# Patient Record
Sex: Female | Born: 1955 | Race: White | Hispanic: No | Marital: Married | Smoking: Former smoker
Health system: Southern US, Community
[De-identification: ages and names within clinical notes are randomized; demographics above are authoritative.]

## PROBLEM LIST (undated history)

## (undated) ENCOUNTER — Emergency Department (HOSPITAL_COMMUNITY): Discharge status: Absent without leave | Payer: Commercial Managed Care - PPO

## (undated) DIAGNOSIS — H9190 Unspecified hearing loss, unspecified ear: Secondary | ICD-10-CM

## (undated) DIAGNOSIS — M810 Age-related osteoporosis without current pathological fracture: Secondary | ICD-10-CM

## (undated) DIAGNOSIS — C801 Malignant (primary) neoplasm, unspecified: Secondary | ICD-10-CM

## (undated) DIAGNOSIS — F329 Major depressive disorder, single episode, unspecified: Secondary | ICD-10-CM

## (undated) DIAGNOSIS — M1990 Unspecified osteoarthritis, unspecified site: Secondary | ICD-10-CM

## (undated) DIAGNOSIS — G4730 Sleep apnea, unspecified: Secondary | ICD-10-CM

## (undated) DIAGNOSIS — M797 Fibromyalgia: Secondary | ICD-10-CM

## (undated) DIAGNOSIS — I219 Acute myocardial infarction, unspecified: Secondary | ICD-10-CM

## (undated) DIAGNOSIS — R112 Nausea with vomiting, unspecified: Secondary | ICD-10-CM

## (undated) DIAGNOSIS — K862 Cyst of pancreas: Secondary | ICD-10-CM

## (undated) DIAGNOSIS — B1920 Unspecified viral hepatitis C without hepatic coma: Secondary | ICD-10-CM

## (undated) DIAGNOSIS — K219 Gastro-esophageal reflux disease without esophagitis: Secondary | ICD-10-CM

## (undated) DIAGNOSIS — I499 Cardiac arrhythmia, unspecified: Secondary | ICD-10-CM

## (undated) DIAGNOSIS — G8929 Other chronic pain: Secondary | ICD-10-CM

## (undated) DIAGNOSIS — I2510 Atherosclerotic heart disease of native coronary artery without angina pectoris: Secondary | ICD-10-CM

## (undated) DIAGNOSIS — F32A Depression, unspecified: Secondary | ICD-10-CM

## (undated) DIAGNOSIS — Z8719 Personal history of other diseases of the digestive system: Secondary | ICD-10-CM

## (undated) DIAGNOSIS — M545 Low back pain, unspecified: Secondary | ICD-10-CM

## (undated) DIAGNOSIS — Z9889 Other specified postprocedural states: Secondary | ICD-10-CM

## (undated) DIAGNOSIS — E785 Hyperlipidemia, unspecified: Secondary | ICD-10-CM

## (undated) DIAGNOSIS — R011 Cardiac murmur, unspecified: Secondary | ICD-10-CM

## (undated) HISTORY — DX: Major depressive disorder, single episode, unspecified: F32.9

## (undated) HISTORY — DX: Age-related osteoporosis without current pathological fracture: M81.0

## (undated) HISTORY — DX: Cyst of pancreas: K86.2

## (undated) HISTORY — DX: Unspecified viral hepatitis C without hepatic coma: B19.20

## (undated) HISTORY — DX: Depression, unspecified: F32.A

## (undated) HISTORY — PX: GANGLION CYST EXCISION: SHX1691

## (undated) HISTORY — DX: Atherosclerotic heart disease of native coronary artery without angina pectoris: I25.10

## (undated) HISTORY — DX: Malignant (primary) neoplasm, unspecified: C80.1

## (undated) HISTORY — PX: COLONOSCOPY: SHX174

## (undated) HISTORY — DX: Cardiac murmur, unspecified: R01.1

## (undated) HISTORY — DX: Hyperlipidemia, unspecified: E78.5

## (undated) HISTORY — PX: CATARACT EXTRACTION W/ INTRAOCULAR LENS  IMPLANT, BILATERAL: SHX1307

---

## 1997-07-04 ENCOUNTER — Ambulatory Visit (HOSPITAL_COMMUNITY): Admission: RE | Admit: 1997-07-04 | Discharge: 1997-07-04 | Payer: Self-pay | Admitting: Gastroenterology

## 1997-07-07 ENCOUNTER — Ambulatory Visit (HOSPITAL_COMMUNITY): Admission: RE | Admit: 1997-07-07 | Discharge: 1997-07-07 | Payer: Self-pay | Admitting: Obstetrics and Gynecology

## 1997-07-21 ENCOUNTER — Ambulatory Visit (HOSPITAL_COMMUNITY): Admission: RE | Admit: 1997-07-21 | Discharge: 1997-07-21 | Payer: Self-pay | Admitting: Family Medicine

## 1997-12-09 ENCOUNTER — Ambulatory Visit (HOSPITAL_COMMUNITY): Admission: RE | Admit: 1997-12-09 | Discharge: 1997-12-09 | Payer: Self-pay | Admitting: Gastroenterology

## 1998-05-04 ENCOUNTER — Ambulatory Visit (HOSPITAL_COMMUNITY): Admission: RE | Admit: 1998-05-04 | Discharge: 1998-05-04 | Payer: Self-pay | Admitting: Family Medicine

## 1998-05-04 ENCOUNTER — Encounter: Payer: Self-pay | Admitting: Family Medicine

## 1999-06-07 ENCOUNTER — Encounter: Payer: Self-pay | Admitting: Family Medicine

## 1999-06-07 ENCOUNTER — Ambulatory Visit (HOSPITAL_COMMUNITY): Admission: RE | Admit: 1999-06-07 | Discharge: 1999-06-07 | Payer: Self-pay | Admitting: Family Medicine

## 2000-02-27 ENCOUNTER — Ambulatory Visit (HOSPITAL_COMMUNITY): Admission: RE | Admit: 2000-02-27 | Discharge: 2000-02-27 | Payer: Self-pay

## 2000-12-25 ENCOUNTER — Other Ambulatory Visit: Admission: RE | Admit: 2000-12-25 | Discharge: 2000-12-25 | Payer: Self-pay | Admitting: Family Medicine

## 2001-01-12 ENCOUNTER — Encounter: Payer: Self-pay | Admitting: Family Medicine

## 2001-01-12 ENCOUNTER — Ambulatory Visit (HOSPITAL_COMMUNITY): Admission: RE | Admit: 2001-01-12 | Discharge: 2001-01-12 | Payer: Self-pay | Admitting: Family Medicine

## 2002-03-19 ENCOUNTER — Encounter: Payer: Self-pay | Admitting: Family Medicine

## 2002-03-19 ENCOUNTER — Ambulatory Visit (HOSPITAL_COMMUNITY): Admission: RE | Admit: 2002-03-19 | Discharge: 2002-03-19 | Payer: Self-pay | Admitting: *Deleted

## 2002-05-27 ENCOUNTER — Encounter: Payer: Self-pay | Admitting: Family Medicine

## 2002-05-27 ENCOUNTER — Encounter: Admission: RE | Admit: 2002-05-27 | Discharge: 2002-05-27 | Payer: Self-pay | Admitting: Family Medicine

## 2003-04-26 ENCOUNTER — Ambulatory Visit (HOSPITAL_COMMUNITY): Admission: RE | Admit: 2003-04-26 | Discharge: 2003-04-26 | Payer: Self-pay | Admitting: Family Medicine

## 2004-04-26 ENCOUNTER — Ambulatory Visit (HOSPITAL_COMMUNITY): Admission: RE | Admit: 2004-04-26 | Discharge: 2004-04-26 | Payer: Self-pay | Admitting: Family Medicine

## 2005-07-03 ENCOUNTER — Encounter: Admission: RE | Admit: 2005-07-03 | Discharge: 2005-07-03 | Payer: Self-pay | Admitting: Emergency Medicine

## 2006-06-02 ENCOUNTER — Ambulatory Visit (HOSPITAL_COMMUNITY): Admission: RE | Admit: 2006-06-02 | Discharge: 2006-06-02 | Payer: Self-pay | Admitting: Obstetrics & Gynecology

## 2007-06-04 ENCOUNTER — Ambulatory Visit (HOSPITAL_COMMUNITY): Admission: RE | Admit: 2007-06-04 | Discharge: 2007-06-04 | Payer: Self-pay | Admitting: Obstetrics & Gynecology

## 2007-08-18 ENCOUNTER — Encounter: Admission: RE | Admit: 2007-08-18 | Discharge: 2007-08-18 | Payer: Self-pay | Admitting: Otolaryngology

## 2008-06-06 ENCOUNTER — Encounter: Admission: RE | Admit: 2008-06-06 | Discharge: 2008-06-06 | Payer: Self-pay | Admitting: Obstetrics & Gynecology

## 2008-06-08 ENCOUNTER — Encounter: Admission: RE | Admit: 2008-06-08 | Discharge: 2008-06-08 | Payer: Self-pay | Admitting: Obstetrics & Gynecology

## 2008-08-10 ENCOUNTER — Emergency Department (HOSPITAL_COMMUNITY): Admission: EM | Admit: 2008-08-10 | Discharge: 2008-08-10 | Payer: Self-pay | Admitting: Emergency Medicine

## 2009-05-04 ENCOUNTER — Encounter: Admission: RE | Admit: 2009-05-04 | Discharge: 2009-05-04 | Payer: Self-pay | Admitting: Gastroenterology

## 2009-06-08 HISTORY — PX: TRANSTHORACIC ECHOCARDIOGRAM: SHX275

## 2009-06-08 HISTORY — PX: CARDIOVASCULAR STRESS TEST: SHX262

## 2009-06-09 ENCOUNTER — Encounter: Admission: RE | Admit: 2009-06-09 | Discharge: 2009-06-09 | Payer: Self-pay | Admitting: Obstetrics & Gynecology

## 2009-07-20 ENCOUNTER — Encounter: Admission: RE | Admit: 2009-07-20 | Discharge: 2009-07-20 | Payer: Self-pay | Admitting: Gastroenterology

## 2010-02-04 ENCOUNTER — Encounter: Payer: Self-pay | Admitting: Family Medicine

## 2010-02-04 ENCOUNTER — Encounter: Payer: Self-pay | Admitting: Emergency Medicine

## 2010-02-05 ENCOUNTER — Encounter: Payer: Self-pay | Admitting: Obstetrics & Gynecology

## 2010-04-19 ENCOUNTER — Other Ambulatory Visit: Payer: Self-pay | Admitting: Obstetrics & Gynecology

## 2010-04-19 DIAGNOSIS — Z1231 Encounter for screening mammogram for malignant neoplasm of breast: Secondary | ICD-10-CM

## 2010-06-14 ENCOUNTER — Ambulatory Visit
Admission: RE | Admit: 2010-06-14 | Discharge: 2010-06-14 | Disposition: A | Discharge status: Home / Self Care | Payer: PRIVATE HEALTH INSURANCE | Source: Ambulatory Visit | Attending: Obstetrics & Gynecology | Admitting: Obstetrics & Gynecology

## 2010-06-14 ENCOUNTER — Other Ambulatory Visit: Payer: Self-pay | Admitting: Obstetrics & Gynecology

## 2010-06-14 DIAGNOSIS — Z1231 Encounter for screening mammogram for malignant neoplasm of breast: Secondary | ICD-10-CM

## 2010-06-14 DIAGNOSIS — N6459 Other signs and symptoms in breast: Secondary | ICD-10-CM

## 2010-06-22 ENCOUNTER — Ambulatory Visit
Admission: RE | Admit: 2010-06-22 | Discharge: 2010-06-22 | Disposition: A | Discharge status: Home / Self Care | Payer: PRIVATE HEALTH INSURANCE | Source: Ambulatory Visit | Attending: Obstetrics & Gynecology | Admitting: Obstetrics & Gynecology

## 2010-06-22 ENCOUNTER — Other Ambulatory Visit: Payer: Self-pay | Admitting: Obstetrics & Gynecology

## 2010-06-22 DIAGNOSIS — N6459 Other signs and symptoms in breast: Secondary | ICD-10-CM

## 2010-07-09 ENCOUNTER — Other Ambulatory Visit: Payer: Self-pay | Admitting: Gastroenterology

## 2010-07-09 DIAGNOSIS — B182 Chronic viral hepatitis C: Secondary | ICD-10-CM

## 2010-07-09 DIAGNOSIS — K862 Cyst of pancreas: Secondary | ICD-10-CM

## 2010-07-12 ENCOUNTER — Ambulatory Visit
Admission: RE | Admit: 2010-07-12 | Discharge: 2010-07-12 | Disposition: A | Discharge status: Home / Self Care | Payer: PRIVATE HEALTH INSURANCE | Source: Ambulatory Visit | Attending: Gastroenterology | Admitting: Gastroenterology

## 2010-07-12 DIAGNOSIS — B182 Chronic viral hepatitis C: Secondary | ICD-10-CM

## 2010-07-12 DIAGNOSIS — K862 Cyst of pancreas: Secondary | ICD-10-CM

## 2010-07-12 MED ORDER — GADOBENATE DIMEGLUMINE 529 MG/ML IV SOLN
10.0000 mL | Freq: Once | INTRAVENOUS | Status: AC | PRN
  Administered 2010-07-12: 10 mL via INTRAVENOUS

## 2011-05-16 ENCOUNTER — Other Ambulatory Visit: Payer: Self-pay | Admitting: Obstetrics & Gynecology

## 2011-05-16 DIAGNOSIS — Z1231 Encounter for screening mammogram for malignant neoplasm of breast: Secondary | ICD-10-CM

## 2011-05-24 ENCOUNTER — Other Ambulatory Visit: Payer: Self-pay | Admitting: Gastroenterology

## 2011-05-24 DIAGNOSIS — B1920 Unspecified viral hepatitis C without hepatic coma: Secondary | ICD-10-CM

## 2011-05-24 DIAGNOSIS — K862 Cyst of pancreas: Secondary | ICD-10-CM

## 2011-05-24 DIAGNOSIS — C220 Liver cell carcinoma: Secondary | ICD-10-CM

## 2011-05-30 ENCOUNTER — Ambulatory Visit
Admission: RE | Admit: 2011-05-30 | Discharge: 2011-05-30 | Disposition: A | Discharge status: Home / Self Care | Payer: PRIVATE HEALTH INSURANCE | Source: Ambulatory Visit | Attending: Gastroenterology | Admitting: Gastroenterology

## 2011-05-30 DIAGNOSIS — C220 Liver cell carcinoma: Secondary | ICD-10-CM

## 2011-05-30 DIAGNOSIS — K862 Cyst of pancreas: Secondary | ICD-10-CM

## 2011-05-30 DIAGNOSIS — B1920 Unspecified viral hepatitis C without hepatic coma: Secondary | ICD-10-CM

## 2011-06-14 ENCOUNTER — Ambulatory Visit
Admission: RE | Admit: 2011-06-14 | Discharge: 2011-06-14 | Disposition: A | Discharge status: Home / Self Care | Payer: PRIVATE HEALTH INSURANCE | Source: Ambulatory Visit | Attending: Obstetrics & Gynecology | Admitting: Obstetrics & Gynecology

## 2011-06-14 DIAGNOSIS — Z1231 Encounter for screening mammogram for malignant neoplasm of breast: Secondary | ICD-10-CM

## 2011-06-20 ENCOUNTER — Other Ambulatory Visit: Payer: Self-pay | Admitting: Obstetrics & Gynecology

## 2011-06-20 DIAGNOSIS — R928 Other abnormal and inconclusive findings on diagnostic imaging of breast: Secondary | ICD-10-CM

## 2011-06-28 ENCOUNTER — Ambulatory Visit
Admission: RE | Admit: 2011-06-28 | Discharge: 2011-06-28 | Disposition: A | Discharge status: Home / Self Care | Payer: PRIVATE HEALTH INSURANCE | Source: Ambulatory Visit | Attending: Obstetrics & Gynecology | Admitting: Obstetrics & Gynecology

## 2011-06-28 DIAGNOSIS — R928 Other abnormal and inconclusive findings on diagnostic imaging of breast: Secondary | ICD-10-CM

## 2011-08-08 ENCOUNTER — Encounter: Payer: Self-pay | Admitting: Family Medicine

## 2011-08-08 ENCOUNTER — Ambulatory Visit (INDEPENDENT_AMBULATORY_CARE_PROVIDER_SITE_OTHER): Payer: PRIVATE HEALTH INSURANCE | Admitting: Family Medicine

## 2011-08-08 VITALS — BP 108/62 | HR 80 | Temp 98.3°F | Ht 61.0 in | Wt 125.0 lb

## 2011-08-08 DIAGNOSIS — F3289 Other specified depressive episodes: Secondary | ICD-10-CM

## 2011-08-08 DIAGNOSIS — R300 Dysuria: Secondary | ICD-10-CM | POA: Insufficient documentation

## 2011-08-08 DIAGNOSIS — F329 Major depressive disorder, single episode, unspecified: Secondary | ICD-10-CM | POA: Insufficient documentation

## 2011-08-08 DIAGNOSIS — F32A Depression, unspecified: Secondary | ICD-10-CM | POA: Insufficient documentation

## 2011-08-08 DIAGNOSIS — K862 Cyst of pancreas: Secondary | ICD-10-CM

## 2011-08-08 DIAGNOSIS — K863 Pseudocyst of pancreas: Secondary | ICD-10-CM

## 2011-08-08 DIAGNOSIS — B1920 Unspecified viral hepatitis C without hepatic coma: Secondary | ICD-10-CM | POA: Insufficient documentation

## 2011-08-08 LAB — POCT URINALYSIS DIPSTICK
Bilirubin, UA: NEGATIVE
Blood, UA: NEGATIVE
Glucose, UA: NEGATIVE
Ketones, UA: NEGATIVE
Leukocytes, UA: NEGATIVE
Nitrite, UA: NEGATIVE
Protein, UA: NEGATIVE
Spec Grav, UA: 1.015
Urobilinogen, UA: NEGATIVE
pH, UA: 6

## 2011-08-08 NOTE — Progress Notes (Signed)
Subjective:    Patient ID: Nicole Calhoun, female    DOB: 09-Mar-1955, 56 y.o.   MRN: [id number]  HPI  56 yo female here to establish care.  Hep C- diagnosed over 20 years ago.  Followed by Dr. Kinnie Scales.  Per pt, could not tolerate interferon treatment.  Brings in labs from last month-liver function within normal limits.  Depression- has a h/o anxiety and depression. Also runs in her family. Going through increased stressors at home, has felt more anxious.  Having difficulty staying asleep. Sometimes tearful.  No SI or HI. Tried Wellbutrin- made her too jittery.  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  Awaiting records.  Dysuria- intermittent dysuria over past few weeks. No back pain, no increased frequency, no fever, no nausea or vomiting. Patient Active Problem List  Diagnosis  . Depression  . Hepatitis C  . Pancreatic cyst  . Dysuria   Past Medical History  Diagnosis Date  . Hepatitis C   . Depression   . Pancreatic cyst    No past surgical history on file. History  Substance Use Topics  . Smoking status: Former Games developer  . Smokeless tobacco: Not on file  . Alcohol Use: Not on file   Family History  Problem Relation Age of Onset  . Arthritis Mother   . Heart disease Mother   . Cancer Father    Allergies  Allergen Reactions  . Codeine Nausea Only   Current Outpatient Prescriptions on File Prior to Visit  Medication Sig Dispense Refill  . cetirizine (ZYRTEC) 10 MG tablet Take one by mouth daily as needed       The PMH, PSH, Social History, Family History, Medications, and allergies have been reviewed in [facility], and have been updated if relevant.   Review of Systems See HPI   No CP No SOB Objective:   Physical Exam BP 108/62  Pulse 80  Temp 98.3 F (36.8 C)  Ht 5\' 1"  (1.549 m)  Wt 125 lb (56.7 kg)  BMI 23.62 kg/m2  General:  Well-developed,well-nourished,in no acute distress; alert,appropriate and  cooperative throughout examination Head:  normocephalic and atraumatic.   Eyes:  vision grossly intact, pupils equal, pupils round, and pupils reactive to light.   Ears:  R ear normal and L ear normal.   Nose:  no external deformity.   Mouth:  good dentition.   Neck:  No deformities, masses, or tenderness noted. Lungs:  Normal respiratory effort, chest expands symmetrically. Lungs are clear to auscultation, no crackles or wheezes. Heart:  Normal rate and regular rhythm. S1 and S2 normal without gallop, murmur, click, rub or other extra sounds. Msk:  No deformity or scoliosis noted of thoracic or lumbar spine.   NO CVA tenderness Extremities:  No clubbing, cyanosis, edema, or deformity noted with normal full range of motion of all joints.   Neurologic:  alert & oriented X3 and gait normal.   Skin:  Intact without suspicious lesions or rashes Cervical Nodes:  No lymphadenopathy noted Psych:  Cognition and judgment appear intact. Alert and cooperative with normal attention span and concentration. No apparent delusions, illusions, hallucinations        Assessment & Plan:   1. Depression  Discussed tx options. She is fearful of medications due to her Hep C. She would prefer to try other options- referred to Dr. Laymond Purser for psychotherpay. Ambulatory referral to Psychology  2. Dysuria  UA neg. If symptoms persist, she will  let us know. POCT urinalysis dipstick  3. Hepatitis C  Liver function stable-awaiting records.   4. Pancreatic cyst  Awaiting records.

## 2011-08-08 NOTE — Patient Instructions (Addendum)
It was so nice to meet you. Your urine was clear- please drink plenty of fluids and let me know if your symptoms do not improve over the next week or so. Please stop by to see Shirlee Limerick to set up your referral for a therapist.

## 2011-08-15 ENCOUNTER — Ambulatory Visit: Payer: PRIVATE HEALTH INSURANCE | Admitting: Psychology

## 2011-08-30 ENCOUNTER — Ambulatory Visit: Payer: PRIVATE HEALTH INSURANCE | Admitting: Psychology

## 2011-09-05 ENCOUNTER — Ambulatory Visit (INDEPENDENT_AMBULATORY_CARE_PROVIDER_SITE_OTHER): Payer: PRIVATE HEALTH INSURANCE | Admitting: Psychology

## 2011-09-05 DIAGNOSIS — F411 Generalized anxiety disorder: Secondary | ICD-10-CM

## 2011-09-05 DIAGNOSIS — F331 Major depressive disorder, recurrent, moderate: Secondary | ICD-10-CM

## 2011-09-06 ENCOUNTER — Telehealth: Payer: Self-pay | Admitting: *Deleted

## 2011-09-06 NOTE — Telephone Encounter (Signed)
Pt's psychologist called to let you know that pt will be calling to request an anti depressant.  Dr. Dellia Cloud feels that pt would benefit from something that works for both anxiety and depression.    Do you want pt to schedule appt to discuss?

## 2011-09-06 NOTE — Telephone Encounter (Signed)
Appointment scheduled, patient advised. °

## 2011-09-06 NOTE — Telephone Encounter (Signed)
Yes please make appt to discuss.  At previous office visit she declined antidepressants so we need to talk about options.  Thanks.

## 2011-09-12 ENCOUNTER — Ambulatory Visit (INDEPENDENT_AMBULATORY_CARE_PROVIDER_SITE_OTHER): Payer: PRIVATE HEALTH INSURANCE | Admitting: Family Medicine

## 2011-09-12 VITALS — BP 110/70 | HR 88 | Temp 97.4°F | Wt 127.0 lb

## 2011-09-12 DIAGNOSIS — F329 Major depressive disorder, single episode, unspecified: Secondary | ICD-10-CM

## 2011-09-12 DIAGNOSIS — F32A Depression, unspecified: Secondary | ICD-10-CM

## 2011-09-12 DIAGNOSIS — F3289 Other specified depressive episodes: Secondary | ICD-10-CM

## 2011-09-12 MED ORDER — CITALOPRAM HYDROBROMIDE 20 MG PO TABS: 20.0000 mg | ORAL_TABLET | Freq: Every day | ORAL | Status: DC

## 2011-09-12 NOTE — Progress Notes (Signed)
  Subjective:    Patient ID: Nicole Calhoun, female    DOB: January 13, 1956, 56 y.o.   MRN: [id number]  HPI  56 yo female here to discuss depression.  When she establish cared with me last month, referred her for psychotherapy as she did not want to start an antidepressant at that time. She is now seeing Dr. Dellia Cloud who called our office stating that she would benefit from an antidepressant.    Has a h/o anxiety and depression. Also runs in her family. Going through increased stressors at home, has felt more anxious.  Having obsessive thoughts- worrying all the time. Sometimes tearful.  No SI or HI. Tried Wellbutrin- made her too jittery.     Patient Active Problem List  Diagnosis  . Depression  . Hepatitis C  . Pancreatic cyst  . Dysuria   Past Medical History  Diagnosis Date  . Hepatitis C   . Depression   . Pancreatic cyst    No past surgical history on file. History  Substance Use Topics  . Smoking status: Former Games developer  . Smokeless tobacco: Not on file  . Alcohol Use: Not on file   Family History  Problem Relation Age of Onset  . Arthritis Mother   . Heart disease Mother   . Cancer Father    Allergies  Allergen Reactions  . Codeine Nausea Only   Current Outpatient Prescriptions on File Prior to Visit  Medication Sig Dispense Refill  . cetirizine (ZYRTEC) 10 MG tablet Take one by mouth daily as needed      . ibuprofen (ADVIL,MOTRIN) 600 MG tablet Take 600 mg by mouth every 6 (six) hours as needed.       The PMH, PSH, Social History, Family History, Medications, and allergies have been reviewed in [facility], and have been updated if relevant.   Review of Systems See HPI   No CP No SOB Objective:   Physical Exam BP 110/70  Pulse 88  Temp 97.4 F (36.3 C)  Wt 127 lb (57.607 kg)  General:  Well-developed,well-nourished,in no acute distress; alert,appropriate and cooperative throughout examination Head:  normocephalic and atraumatic.   Psych:  Cognition  and judgment appear intact. Alert and cooperative with normal attention span and concentration. No apparent delusions, illusions, hallucinations      Assessment & Plan:   1. Depression  Discussed tx options. She is fearful of medications due to her Hep C. Fr. Dellia Cloud recommended Zoloft to her which would be appropriate due to her obsessive thoughts although this is extensively metabolized in the liver. Will start celexa 20 mg daily. Follow up in 3 weeks. The patient indicates understanding of these issues and agrees with the plan.

## 2011-09-12 NOTE — Patient Instructions (Signed)
Good to see you. We are starting Celexa- call me in 3-4 weeks with an update.

## 2011-09-27 ENCOUNTER — Ambulatory Visit (INDEPENDENT_AMBULATORY_CARE_PROVIDER_SITE_OTHER): Payer: PRIVATE HEALTH INSURANCE | Admitting: Psychology

## 2011-09-27 DIAGNOSIS — F411 Generalized anxiety disorder: Secondary | ICD-10-CM

## 2011-09-27 DIAGNOSIS — F331 Major depressive disorder, recurrent, moderate: Secondary | ICD-10-CM

## 2011-10-11 ENCOUNTER — Telehealth: Payer: Self-pay

## 2011-10-11 ENCOUNTER — Ambulatory Visit: Payer: Self-pay | Admitting: Psychology

## 2011-10-11 NOTE — Telephone Encounter (Signed)
Pt left v/m requesting a call to [facility]; pt was told cannot get Celexa for 3 weeks; pt stated last filled 09/12/11. I called [facility]; pts med transferred at pts request to [facility] Called [facility] they had just spoke with insurance co. Celexa filled 6 days ago at [facility] did not specify which [facility]). Left v/m for pt to call back.

## 2011-11-28 ENCOUNTER — Telehealth: Payer: Self-pay | Admitting: Family Medicine

## 2011-11-28 NOTE — Telephone Encounter (Signed)
Patient Information:  Caller Name: Nicole Calhoun  Phone: [phone]  Patient: Nicole Calhoun, Nicole Calhoun  Gender: Female  DOB: 1955/02/03  Age: 56 Years  PCP: [facility])  Pregnant: No   Symptoms  Reason For Call & Symptoms: Urinary symptoms  Reviewed Health History In EMR: Yes  Reviewed Medications In EMR: Yes  Reviewed Allergies In EMR: Yes  Date of Onset of Symptoms: 10/23/2011  Treatments Tried: Tried vagisil cream  Treatments Tried Worked: No OB:  LMP: Unknown  Guideline(s) Used:  Vulvar Symptoms  Disposition Per Guideline:   See Within 2 Weeks in Office  Reason For Disposition Reached:   ALL other vulvar symptoms (Exception: feels like prior yeast infection, or rash < 24 hour duration)  Advice Given:  Call Back If:   You become worse.  Office Follow Up:  Does the office need to follow up with this patient?: Yes  Instructions For The Office: Disposition see in 2 weeks patient would like appt 11/29/11 with a female provider if possible.  OFFICE NOTE PLEASE FOLLOW UP WITH AN APPOINTMENT TIME.  RN Note:  No itching, only has symptom of burning in the urethral area that is constant. No other urinary symptoms.

## 2011-11-28 NOTE — Telephone Encounter (Signed)
Left message asking patient to call back to schedule appt with Dr. Milinda Antis for tomorrow, as she is the only female physicians with any open appt slots for tomorrow.

## 2011-11-29 ENCOUNTER — Ambulatory Visit (INDEPENDENT_AMBULATORY_CARE_PROVIDER_SITE_OTHER): Payer: Commercial Managed Care - PPO | Admitting: Family Medicine

## 2011-11-29 ENCOUNTER — Encounter: Payer: Self-pay | Admitting: Family Medicine

## 2011-11-29 VITALS — BP 124/74 | HR 78 | Temp 98.4°F | Ht 61.0 in | Wt 129.8 lb

## 2011-11-29 DIAGNOSIS — R300 Dysuria: Secondary | ICD-10-CM

## 2011-11-29 DIAGNOSIS — Z23 Encounter for immunization: Secondary | ICD-10-CM

## 2011-11-29 DIAGNOSIS — N952 Postmenopausal atrophic vaginitis: Secondary | ICD-10-CM | POA: Insufficient documentation

## 2011-11-29 LAB — POCT URINALYSIS DIPSTICK
Bilirubin, UA: NEGATIVE
Blood, UA: NEGATIVE
Glucose, UA: NEGATIVE
Ketones, UA: NEGATIVE
Leukocytes, UA: NEGATIVE
Nitrite, UA: NEGATIVE
Protein, UA: NEGATIVE
Spec Grav, UA: 1.015
Urobilinogen, UA: 0.2
pH, UA: 6

## 2011-11-29 MED ORDER — CITALOPRAM HYDROBROMIDE 20 MG PO TABS: 20.0000 mg | ORAL_TABLET | Freq: Every day | ORAL | Status: DC

## 2011-11-29 NOTE — Telephone Encounter (Signed)
Pt has appt with Dr. Milinda Antis today.

## 2011-11-29 NOTE — Patient Instructions (Addendum)
I think you have atrophic vaginitis - is age related  Consider trying the vaginal estrogen cream you have - just twice weekly  Also can try a different lubricant  Urine test is negative Back off coffee and tea- and drink more water Also keep soap out of bath also  Flu shot today

## 2011-11-29 NOTE — Progress Notes (Signed)
Subjective:    Patient ID: Nicole Calhoun, female    DOB: 1955-04-11, 56 y.o.   MRN: [id number]  HPI Here with urinary symptoms  Burning to urinate - for about 2 weeks  occ some bladder discomfort  No frequency , no urgency . No incontinence  Has not seen any blood in her urine   Tends to have low back pain - but not flank pain   No nausea or fever   She is a coffee and tea drinker  Drinks 2 cups of coffee per day, and tea - a lot more  Some water/ some ice   No hx of frequent utis or bladder problems   Does take bubble baths   No vulvar itching  No vaginal discharge  Does have dysparunia - uses water based lubricant  Did try some vagisil cream for irritation  Last exam was in the summer time - all was ok  Does have vaginal dryness  Was given an estrogen cream at one time and she was afraid to use it after she read the package insert  Last pap was nl this summer per pt   Patient Active Problem List  Diagnosis  . Depression  . Hepatitis C  . Pancreatic cyst  . Dysuria   Past Medical History  Diagnosis Date  . Hepatitis C   . Depression   . Pancreatic cyst    No past surgical history on file. History  Substance Use Topics  . Smoking status: Former Games developer  . Smokeless tobacco: Not on file  . Alcohol Use: No   Family History  Problem Relation Age of Onset  . Arthritis Mother   . Heart disease Mother   . Cancer Father    Allergies  Allergen Reactions  . Codeine Nausea Only   Current Outpatient Prescriptions on File Prior to Visit  Medication Sig Dispense Refill  . cetirizine (ZYRTEC) 10 MG tablet Take one by mouth daily as needed      . citalopram (CELEXA) 20 MG tablet Take 1 tablet (20 mg total) by mouth daily.  30 tablet  2  . ibuprofen (ADVIL,MOTRIN) 600 MG tablet Take 600 mg by mouth every 6 (six) hours as needed.           Review of Systems    Review of Systems  Constitutional: Negative for fever, appetite change, fatigue and unexpected  weight change.  Eyes: Negative for pain and visual disturbance.  Respiratory: Negative for cough and shortness of breath.   Cardiovascular: Negative for cp or palpitations    Gastrointestinal: Negative for nausea, diarrhea and constipation.  Genitourinary: Negative for urgency and frequency. pos for dysuria and vulvar discomfort, pos for dysparunia, neg for vaginal d/c Skin: Negative for pallor or rash   Neurological: Negative for weakness, light-headedness, numbness and headaches.  Hematological: Negative for adenopathy. Does not bruise/bleed easily.  Psychiatric/Behavioral: Negative for dysphoric mood. The patient is not nervous/anxious.      Objective:   Physical Exam  Constitutional: She appears well-developed and well-nourished. No distress.  HENT:  Head: Normocephalic and atraumatic.  Mouth/Throat: Oropharynx is clear and moist.  Eyes: Conjunctivae normal and EOM are normal. Pupils are equal, round, and reactive to light. No scleral icterus.  Neck: Normal range of motion. Neck supple. No thyromegaly present.  Cardiovascular: Normal rate and regular rhythm.   Pulmonary/Chest: Effort normal and breath sounds normal.  Abdominal: Soft. Bowel sounds are normal. She exhibits no distension and no mass. There is no tenderness.  No suprapubic tenderness or fullness    Genitourinary: No tenderness or bleeding around the vagina. No signs of injury around the vagina. No vaginal discharge found.       Generally dry vaginal mucosa Nl appearing urethra No vaginal disch  Wet prep done  Neurological: She is alert.  Skin: Skin is warm and dry. No rash noted. No erythema. No pallor.  Psychiatric: She has a normal mood and affect.          Assessment & Plan:

## 2011-11-29 NOTE — Assessment & Plan Note (Addendum)
With neg ua and no other urinary symptoms  Also neg wet prep  ? If this could be urethritis or vaginal atrophy Disc avoidance of bladder irritants and bubble baths Will inc her water intake She does c/o vaginal dryness and was afraid to use the vaginal estrogen cream she was px

## 2011-12-01 LAB — POCT WET PREP (WET MOUNT)
KOH Wet Prep POC: NEGATIVE
WBC, Wet Prep HPF POC: 0

## 2011-12-01 NOTE — Assessment & Plan Note (Signed)
I suspect this may add to her urinary discomfort and dysparunia Disc pros/cons of using estrogen cream she was given in the past  She is going to consider trying it

## 2011-12-07 ENCOUNTER — Other Ambulatory Visit: Payer: Self-pay | Admitting: Family Medicine

## 2011-12-13 ENCOUNTER — Other Ambulatory Visit: Payer: Self-pay | Admitting: Family Medicine

## 2011-12-26 ENCOUNTER — Telehealth: Payer: Self-pay

## 2011-12-26 NOTE — Telephone Encounter (Signed)
Pt left v/m with billing issue. Left v/m for pt to call back.

## 2011-12-26 NOTE — Telephone Encounter (Signed)
Spoke with pt and billing question is already taken care of.

## 2012-03-23 ENCOUNTER — Telehealth (INDEPENDENT_AMBULATORY_CARE_PROVIDER_SITE_OTHER): Payer: Commercial Managed Care - PPO | Admitting: Family Medicine

## 2012-03-23 DIAGNOSIS — R300 Dysuria: Secondary | ICD-10-CM

## 2012-03-23 LAB — POCT URINALYSIS DIPSTICK
Bilirubin, UA: NEGATIVE
Blood, UA: NEGATIVE
Glucose, UA: NEGATIVE
Ketones, UA: NEGATIVE
Leukocytes, UA: NEGATIVE
Nitrite, UA: NEGATIVE
Protein, UA: NEGATIVE
Spec Grav, UA: 1.025
Urobilinogen, UA: NEGATIVE
pH, UA: 6

## 2012-03-23 NOTE — Telephone Encounter (Signed)
Left message advising patient ok to drop off urine this afternoon.

## 2012-03-23 NOTE — Telephone Encounter (Signed)
UA (neg)

## 2012-03-23 NOTE — Telephone Encounter (Signed)
Caller: Debbie/Patient; Phone: [phone]; Reason for Call: Patient states she feels she has UTI and would like to drop off urine for testing.  Pt was unable to talk due to being at work and spotty cell phone reception, unable to complete triage.  Patient states she take lunch break at [facility] and can drop off urine sample then.  PLEASE F/U WITH PT IF THIS IS OK.  PT STATES OK TO LEAVE VOICE MAIL MESSAGE ON CELL PHONE AND SHE WILL CHECK MESSAGES.  THANK YOU.

## 2012-03-23 NOTE — Telephone Encounter (Signed)
Results routed to dr Dayton Martes

## 2012-03-23 NOTE — Telephone Encounter (Signed)
I'm ok with her dropping off urine in this situation.

## 2012-03-31 ENCOUNTER — Other Ambulatory Visit: Payer: Self-pay | Admitting: Family Medicine

## 2012-04-01 NOTE — Telephone Encounter (Signed)
Rout to PCP 

## 2012-04-28 HISTORY — PX: FOOT SURGERY: SHX648

## 2012-05-11 ENCOUNTER — Other Ambulatory Visit: Payer: Self-pay

## 2012-05-11 DIAGNOSIS — Z1231 Encounter for screening mammogram for malignant neoplasm of breast: Secondary | ICD-10-CM

## 2012-06-19 ENCOUNTER — Ambulatory Visit
Admission: RE | Admit: 2012-06-19 | Discharge: 2012-06-19 | Disposition: A | Discharge status: Home / Self Care | Payer: Commercial Managed Care - PPO | Source: Ambulatory Visit

## 2012-06-19 DIAGNOSIS — Z1231 Encounter for screening mammogram for malignant neoplasm of breast: Secondary | ICD-10-CM

## 2012-06-22 ENCOUNTER — Other Ambulatory Visit: Payer: Self-pay | Admitting: Gastroenterology

## 2012-06-22 DIAGNOSIS — B182 Chronic viral hepatitis C: Secondary | ICD-10-CM

## 2012-06-25 ENCOUNTER — Other Ambulatory Visit: Payer: Self-pay

## 2012-07-24 ENCOUNTER — Other Ambulatory Visit: Payer: Self-pay

## 2012-07-24 ENCOUNTER — Ambulatory Visit
Admission: RE | Admit: 2012-07-24 | Discharge: 2012-07-24 | Disposition: A | Discharge status: Home / Self Care | Payer: Commercial Managed Care - PPO | Source: Ambulatory Visit | Attending: Gastroenterology | Admitting: Gastroenterology

## 2012-07-24 DIAGNOSIS — B182 Chronic viral hepatitis C: Secondary | ICD-10-CM

## 2012-07-28 ENCOUNTER — Other Ambulatory Visit: Payer: Self-pay | Admitting: Family Medicine

## 2012-07-28 DIAGNOSIS — Z136 Encounter for screening for cardiovascular disorders: Secondary | ICD-10-CM

## 2012-07-28 DIAGNOSIS — Z0000 Encounter for general adult medical examination without abnormal findings: Secondary | ICD-10-CM

## 2012-08-07 ENCOUNTER — Other Ambulatory Visit: Payer: Self-pay

## 2012-08-12 ENCOUNTER — Other Ambulatory Visit (INDEPENDENT_AMBULATORY_CARE_PROVIDER_SITE_OTHER): Payer: Commercial Managed Care - PPO

## 2012-08-12 DIAGNOSIS — Z136 Encounter for screening for cardiovascular disorders: Secondary | ICD-10-CM

## 2012-08-12 DIAGNOSIS — B1920 Unspecified viral hepatitis C without hepatic coma: Secondary | ICD-10-CM

## 2012-08-12 DIAGNOSIS — Z0000 Encounter for general adult medical examination without abnormal findings: Secondary | ICD-10-CM

## 2012-08-12 LAB — COMPREHENSIVE METABOLIC PANEL
ALT: 36 U/L — ABNORMAL HIGH (ref 0–35)
AST: 42 U/L — ABNORMAL HIGH (ref 0–37)
Albumin: 3.8 g/dL (ref 3.5–5.2)
Alkaline Phosphatase: 53 U/L (ref 39–117)
BUN: 14 mg/dL (ref 6–23)
CO2: 31 mEq/L (ref 19–32)
Calcium: 9 mg/dL (ref 8.4–10.5)
Chloride: 103 mEq/L (ref 96–112)
Creatinine, Ser: 0.8 mg/dL (ref 0.4–1.2)
GFR: 78.7 mL/min (ref >60.00)
Glucose, Bld: 95 mg/dL (ref 70–99)
Potassium: 4.4 meq/L (ref 3.5–5.1)
Sodium: 139 mEq/L (ref 135–145)
Total Bilirubin: 1 mg/dL (ref 0.3–1.2)
Total Protein: 6.9 g/dL (ref 6.0–8.3)

## 2012-08-12 LAB — CBC WITH DIFFERENTIAL/PLATELET
Basophils Absolute: 0 10*3/uL (ref 0.0–0.1)
Basophils Relative: 0.5 % (ref 0.0–3.0)
Eosinophils Absolute: 0.2 10*3/uL (ref 0.0–0.7)
Eosinophils Relative: 2.6 % (ref 0.0–5.0)
HCT: 43.7 % (ref 36.0–46.0)
Hemoglobin: 14.7 g/dL (ref 12.0–15.0)
Lymphocytes Relative: 36.1 % (ref 12.0–46.0)
Lymphs Abs: 2.1 10*3/uL (ref 0.7–4.0)
MCHC: 33.7 g/dL (ref 30.0–36.0)
MCV: 94 fl (ref 78.0–100.0)
Monocytes Absolute: 0.5 10*3/uL (ref 0.1–1.0)
Monocytes Relative: 8.6 % (ref 3.0–12.0)
Neutro Abs: 3.1 10*3/uL (ref 1.4–7.7)
Neutrophils Relative %: 52.2 % (ref 43.0–77.0)
Platelets: 222 10*3/uL (ref 150.0–400.0)
RBC: 4.65 Mil/uL (ref 3.87–5.11)
RDW: 12.8 % (ref 11.5–14.6)
WBC: 5.9 10*3/uL (ref 4.5–10.5)

## 2012-08-12 LAB — LIPID PANEL
Cholesterol: 196 mg/dL (ref 0–200)
HDL: 69 mg/dL (ref >39.00)
LDL Cholesterol: 116 mg/dL — ABNORMAL HIGH (ref 0–99)
Total CHOL/HDL Ratio: 3
Triglycerides: 54 mg/dL (ref 0.0–149.0)
VLDL: 10.8 mg/dL (ref 0.0–40.0)

## 2012-08-13 ENCOUNTER — Encounter: Payer: Self-pay | Admitting: Family Medicine

## 2012-08-14 ENCOUNTER — Encounter: Payer: Self-pay | Admitting: Family Medicine

## 2012-08-14 ENCOUNTER — Ambulatory Visit (INDEPENDENT_AMBULATORY_CARE_PROVIDER_SITE_OTHER): Payer: Commercial Managed Care - PPO | Admitting: Family Medicine

## 2012-08-14 VITALS — BP 120/80 | HR 80 | Temp 98.5°F | Ht 61.0 in | Wt 144.0 lb

## 2012-08-14 DIAGNOSIS — K862 Cyst of pancreas: Secondary | ICD-10-CM

## 2012-08-14 DIAGNOSIS — Z0000 Encounter for general adult medical examination without abnormal findings: Secondary | ICD-10-CM | POA: Insufficient documentation

## 2012-08-14 DIAGNOSIS — F329 Major depressive disorder, single episode, unspecified: Secondary | ICD-10-CM

## 2012-08-14 DIAGNOSIS — F3289 Other specified depressive episodes: Secondary | ICD-10-CM

## 2012-08-14 DIAGNOSIS — K863 Pseudocyst of pancreas: Secondary | ICD-10-CM

## 2012-08-14 DIAGNOSIS — F32A Depression, unspecified: Secondary | ICD-10-CM

## 2012-08-14 MED ORDER — BUPROPION HCL ER (XL) 150 MG PO TB24: 150.0000 mg | ORAL_TABLET | Freq: Every day | ORAL | Status: DC

## 2012-08-14 NOTE — Patient Instructions (Addendum)
Great to see you.   In order to wean off of celexa- Take 1/2 tablet daily every other day for 1 week and stop.  Go ahead and start the Wellbutrin.  Always take in the morning and I would avoid caffeine for first few days.  Please call me in a few weeks with an update.

## 2012-08-14 NOTE — Progress Notes (Signed)
Subjective:    Patient ID: Nicole Calhoun, female    DOB: 09-Sep-1955, 57 y.o.   MRN: [id number]  HPI  57 yo pleasant female here for CPX.  Neg mammogram 06/2012- reviewed.  Just had pap smear in June as well ( Dr. Seymour Bars). She is colonoscopy appointment.  Hep C- diagnosed over 20 years ago.  Followed by Dr. Kinnie Scales.  Per pt, could not tolerate interferon treatment. Lab Results  Component Value Date   ALT 36* 08/12/2012   AST 42* 08/12/2012   ALKPHOS 53 08/12/2012   BILITOT 1.0 08/12/2012    Depression- has a h/o anxiety and depression. Also runs in her family. Going through increased stressors at home, has felt more anxious.  Having difficulty staying asleep. Sometimes tearful.  No SI or HI. Tried Wellbutrin- made her too jittery. Started celexa last August- feels celexa is working well.  Wants to come off it because her GI doctor thought it could be causing weight gain.  She wants to try Wellbutrin again.  She admits to being less physically active. Wt Readings from Last 3 Encounters:  08/14/12 144 lb (65.318 kg)  11/29/11 129 lb 12 oz (58.854 kg)  09/12/11 127 lb (57.607 kg)    Was seeing Dr. Dellia Cloud but only went a couple of times.  Pancreatic cyst-  being followed by serial U/S which have been stable.    Clinical Data: 57 year old female with chronic hepatitis C.  COMPLETE ABDOMINAL ULTRASOUND  Comparison: 05/30/2011 and earlier.  Findings:  Gallbladder: Normal. No echogenic stones or sludge. Normal wall  thickness of 2 mm. No sonographic Murphy's sign elicited.  Common bile duct: Normal measuring 4 mm diameter.  Liver: No focal lesion identified. Stable and within normal  limits in parenchymal echogenicity.  IVC: Appears normal.  Pancreas: Chronic central pancreatic body oval hypoechoic lesion  without vascularity is stable measuring approximately 12 x 8 x 11  mm. This was seen on prior studies including MRI 07/12/2010.  Elsewhere pancreatic parenchyma appears  normal.  Spleen: Normal measuring 8 cm in length.  Right Kidney: Normal measuring 10.5 cm in length.  Left Kidney: Normal measuring 11.3 cm in length.  Abdominal aorta: No aneurysm identified. Maximum diameter 2.1 cm.  IMPRESSION:  1. Stable and normal sonographic appearance of the liver and  biliary tree.  2. Stable and benign appearing small pancreatic body cyst.    Patient Active Problem List   Diagnosis Date Noted  . Routine general medical examination at a health care facility 08/14/2012  . Atrophic vaginitis 11/29/2011  . Depression 08/08/2011  . Hepatitis C   . Pancreatic cyst    Past Medical History  Diagnosis Date  . Hepatitis C   . Depression   . Pancreatic cyst    No past surgical history on file. History  Substance Use Topics  . Smoking status: Former Games developer  . Smokeless tobacco: Not on file  . Alcohol Use: No   Family History  Problem Relation Age of Onset  . Arthritis Mother   . Heart disease Mother   . Cancer Father    Allergies  Allergen Reactions  . Codeine Nausea Only   Current Outpatient Prescriptions on File Prior to Visit  Medication Sig Dispense Refill  . cetirizine (ZYRTEC) 10 MG tablet Take one by mouth daily as needed      . citalopram (CELEXA) 20 MG tablet TAKE 1 TABLET (20 MG TOTAL) BY MOUTH DAILY.  30 tablet  2  . citalopram (CELEXA) 20 MG  tablet TAKE 1 TABLET BY MOUTH DAILY.  90 tablet  0  . ibuprofen (ADVIL,MOTRIN) 600 MG tablet Take 600 mg by mouth every 6 (six) hours as needed.       No current facility-administered medications on file prior to visit.   The PMH, PSH, Social History, Family History, Medications, and allergies have been reviewed in [facility], and have been updated if relevant.   Review of Systems See HPI Patient reports no  vision/ hearing changes,anorexia, weight change, fever ,adenopathy, persistant / recurrent hoarseness, swallowing issues, chest pain, edema,persistant / recurrent cough, hemoptysis, dyspnea(rest,  exertional, paroxysmal nocturnal), gastrointestinal  bleeding (melena, rectal bleeding), abdominal pain, excessive heart burn, GU symptoms(dysuria, hematuria, pyuria, voiding/incontinence  Issues) syncope, focal weakness, severe memory loss, concerning skin lesions, abnormal bruising/bleeding, major joint swelling, breast masses or abnormal vaginal bleeding.    Objective:   Physical Exam BP 120/80  Pulse 80  Temp(Src) 98.5 F (36.9 C)  Ht 5\' 1"  (1.549 m)  Wt 144 lb (65.318 kg)  BMI 27.22 kg/m2  General:  Well-developed,well-nourished,in no acute distress; alert,appropriate and cooperative throughout examination Head:  normocephalic and atraumatic.   Eyes:  vision grossly intact, pupils equal, pupils round, and pupils reactive to light.   Ears:  R ear normal and L ear normal.   Nose:  no external deformity.   Mouth:  good dentition.   Neck:  No deformities, masses, or tenderness noted. Lungs:  Normal respiratory effort, chest expands symmetrically. Lungs are clear to auscultation, no crackles or wheezes. Heart:  Normal rate and regular rhythm. S1 and S2 normal without gallop, murmur, click, rub or other extra sounds. Msk:  No deformity or scoliosis noted of thoracic or lumbar spine.   NO CVA tenderness Extremities:  No clubbing, cyanosis, edema, or deformity noted with normal full range of motion of all joints.   Neurologic:  alert & oriented X3 and gait normal.   Skin:  Intact without suspicious lesions or rashes Cervical Nodes:  No lymphadenopathy noted Psych:  Cognition and judgment appear intact. Alert and cooperative with normal attention span and concentration. No apparent delusions, illusions, hallucinations     Assessment & Plan:   1. Routine general medical examination at a health care facility Reviewed preventive care protocols, scheduled due services, and updated immunizations Discussed nutrition, exercise, diet, and healthy lifestyle.   2. Depression Stable on Celexa,  but would like to wean off it of it (see AVS).  Wants to retry Wellbutrin.  Advised to stay away from caffeine. The patient indicates understanding of these issues and agrees with the plan. Follow up in 3 weeks.  3. Pancreatic cyst Stable.    4.  Weight gain- Likely multifactorial.  Advised increase physical activity. Keeping food journal.

## 2012-08-18 ENCOUNTER — Encounter: Payer: Self-pay | Admitting: Family Medicine

## 2012-08-25 ENCOUNTER — Other Ambulatory Visit: Payer: Self-pay | Admitting: Family Medicine

## 2012-08-26 NOTE — Telephone Encounter (Signed)
Left message on pt's cell to return call. She is supposed to have weaned off Celexa and started Wellbutrin.

## 2012-10-09 ENCOUNTER — Ambulatory Visit (INDEPENDENT_AMBULATORY_CARE_PROVIDER_SITE_OTHER): Payer: Commercial Managed Care - PPO | Admitting: Family Medicine

## 2012-10-09 ENCOUNTER — Encounter: Payer: Self-pay | Admitting: Family Medicine

## 2012-10-09 VITALS — BP 118/70 | HR 88 | Temp 98.3°F | Ht 61.0 in | Wt 148.0 lb

## 2012-10-09 DIAGNOSIS — J309 Allergic rhinitis, unspecified: Secondary | ICD-10-CM | POA: Insufficient documentation

## 2012-10-09 MED ORDER — FLUTICASONE PROPIONATE 50 MCG/ACT NA SUSP: 2.0000 | Freq: Every day | NASAL | Status: DC

## 2012-10-09 NOTE — Assessment & Plan Note (Addendum)
Start nasal steroid spray. Continue nasal saline. No current sign of bacterial infeciton.  Call if not improving by 5-7 days.

## 2012-10-09 NOTE — Patient Instructions (Signed)
Start nasal steroid spray. Continue cnasal saline. No current sign of bcaterial infeciton.  Call if not improving by 5-7 days.

## 2012-10-09 NOTE — Progress Notes (Signed)
  Subjective:    Patient ID: Nicole Calhoun, female    DOB: [birth year], 57 y.o.   MRN: [id number]  Sinusitis This is a new problem. The current episode started in the past 7 days. The problem has been gradually worsening since onset. There has been no fever. The pain is severe. Associated symptoms include headaches, sinus pressure and sneezing. Pertinent negatives include no chills, congestion, coughing, ear pain, shortness of breath or sore throat. (Tired) Treatments tried: netty pot, advil cold and sinus. The treatment provided mild relief.   Uses zyrtec for allergies.   Review of Systems  Constitutional: Negative for chills.  HENT: Positive for sneezing and sinus pressure. Negative for ear pain, congestion and sore throat.   Respiratory: Negative for cough and shortness of breath.   Neurological: Positive for headaches.       Objective:   Physical Exam  Constitutional: Vital signs are normal. She appears well-developed and well-nourished. She is cooperative.  Non-toxic appearance. She does not appear ill. No distress.  HENT:  Head: Normocephalic.  Right Ear: Hearing, tympanic membrane, external ear and ear canal normal. Tympanic membrane is not erythematous, not retracted and not bulging.  Left Ear: Hearing, tympanic membrane, external ear and ear canal normal. Tympanic membrane is not erythematous, not retracted and not bulging.  Nose: Mucosal edema present. No rhinorrhea. Right sinus exhibits no maxillary sinus tenderness and no frontal sinus tenderness. Left sinus exhibits no maxillary sinus tenderness and no frontal sinus tenderness.  Mouth/Throat: Uvula is midline, oropharynx is clear and moist and mucous membranes are normal.  Eyes: Conjunctivae, EOM and lids are normal. Pupils are equal, round, and reactive to light. Lids are everted and swept, no foreign bodies found.  Neck: Trachea normal and normal range of motion. Neck supple. Carotid bruit is not present. No mass and no  thyromegaly present.  Cardiovascular: Normal rate, regular rhythm, S1 normal, S2 normal, normal heart sounds, intact distal pulses and normal pulses.  Exam reveals no gallop and no friction rub.   No murmur heard. Pulmonary/Chest: Effort normal and breath sounds normal. Not tachypneic. No respiratory distress. She has no decreased breath sounds. She has no wheezes. She has no rhonchi. She has no rales.  Neurological: She is alert.  Skin: Skin is warm, dry and intact. No rash noted.  Psychiatric: Her speech is normal and behavior is normal. Judgment normal. Her mood appears not anxious. Cognition and memory are normal. She does not exhibit a depressed mood.          Assessment & Plan:

## 2012-10-13 ENCOUNTER — Encounter: Payer: Self-pay | Admitting: *Deleted

## 2012-10-16 ENCOUNTER — Encounter: Payer: Self-pay | Admitting: Podiatry

## 2012-11-13 ENCOUNTER — Ambulatory Visit: Payer: Self-pay | Admitting: Cardiovascular Disease

## 2012-12-21 ENCOUNTER — Other Ambulatory Visit: Payer: Self-pay | Admitting: Family Medicine

## 2012-12-25 ENCOUNTER — Ambulatory Visit: Payer: Self-pay | Admitting: Cardiovascular Disease

## 2012-12-25 ENCOUNTER — Encounter: Payer: Self-pay | Admitting: Cardiovascular Disease

## 2013-01-22 ENCOUNTER — Ambulatory Visit: Payer: Self-pay | Admitting: Family Medicine

## 2013-01-29 ENCOUNTER — Encounter: Payer: Self-pay | Admitting: Family Medicine

## 2013-01-29 ENCOUNTER — Ambulatory Visit (INDEPENDENT_AMBULATORY_CARE_PROVIDER_SITE_OTHER): Payer: Commercial Managed Care - PPO | Admitting: Family Medicine

## 2013-01-29 VITALS — BP 110/68 | HR 76 | Temp 98.4°F | Wt 149.0 lb

## 2013-01-29 DIAGNOSIS — J019 Acute sinusitis, unspecified: Secondary | ICD-10-CM

## 2013-01-29 DIAGNOSIS — R5383 Other fatigue: Secondary | ICD-10-CM

## 2013-01-29 DIAGNOSIS — R5381 Other malaise: Secondary | ICD-10-CM

## 2013-01-29 DIAGNOSIS — D179 Benign lipomatous neoplasm, unspecified: Secondary | ICD-10-CM

## 2013-01-29 LAB — CBC WITH DIFFERENTIAL/PLATELET
Basophils Absolute: 0 10*3/uL (ref 0.0–0.1)
Basophils Relative: 0 % (ref 0–1)
Eosinophils Absolute: 0.1 10*3/uL (ref 0.0–0.7)
Eosinophils Relative: 1 % (ref 0–5)
HCT: 42.6 % (ref 36.0–46.0)
Hemoglobin: 14.8 g/dL (ref 12.0–15.0)
Lymphocytes Relative: 37 % (ref 12–46)
Lymphs Abs: 2.5 10*3/uL (ref 0.7–4.0)
MCH: 31.3 pg (ref 26.0–34.0)
MCHC: 34.7 g/dL (ref 30.0–36.0)
MCV: 90.1 fL (ref 78.0–100.0)
Monocytes Absolute: 0.6 10*3/uL (ref 0.1–1.0)
Monocytes Relative: 9 % (ref 3–12)
Neutro Abs: 3.6 10*3/uL (ref 1.7–7.7)
Neutrophils Relative %: 53 % (ref 43–77)
Platelets: 242 10*3/uL (ref 150–400)
RBC: 4.73 MIL/uL (ref 3.87–5.11)
RDW: 13.3 % (ref 11.5–15.5)
WBC: 6.8 10*3/uL (ref 4.0–10.5)

## 2013-01-29 MED ORDER — AMOXICILLIN 875 MG PO TABS: 875.0000 mg | ORAL_TABLET | Freq: Two times a day (BID) | ORAL | Status: DC

## 2013-01-29 NOTE — Patient Instructions (Addendum)
Take antibiotic as directed.  Drink lots of fluids.   Treat sympotmatically with Mucinex, nasal saline irrigation, and Tylenol/Ibuprofen.   Also try an antihistamine/decongestant like claritin D or zyrtec D over the counter- two times a day as needed ( have to sign for them at pharmacy).   Try over the counter nasocort-start with 2 sprays per nostril per day...and then try to taper to 1 spray per nostril once symptoms improve.   You can use warm compresses.  Cough suppressant at night.   Call if not improving as expected in 5-7 days.   Please stop by to see Rosaria Ferries on your way out to set up your ultrasound.  I will call you with your lab results.

## 2013-01-29 NOTE — Progress Notes (Signed)
Pre-visit discussion using our clinic review tool. No additional management support is needed unless otherwise documented below in the visit note.  

## 2013-01-29 NOTE — Progress Notes (Signed)
Subjective:    Patient ID: Nicole Calhoun, female    DOB: 01-17-1955, 58 y.o.   MRN: [id number]  HPI  58 yo female here for:  1.  Fatigue- feels her "thyroid is off," has been more tired lately.  Has never been a great sleeper.  Takes melatonin.  Also feels she is slowly gaining weight. Wt Readings from Last 3 Encounters:  01/29/13 149 lb (67.586 kg)  10/09/12 148 lb (67.132 kg)  08/14/12 144 lb (65.318 kg)   Denies heat or cold intolerance.  No changes in her bowels.  2.  Chronic sinusitis- worsening for past 10 days.  Stopped taking Zyrtec last year- felt it was not helping.  She does use flonase.  Has more sinus pressure, mucus is now green. No fevers.  No cough.  No SOB.  3.  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  She feels it is growing in size.  When she turns her head, feels more fullness in that area.  Not painful.  Had an ultrasound years ago.  Patient Active Problem List   Diagnosis Date Noted  . Lipoma 01/29/2013  . Acute sinus infection 01/29/2013  . Other malaise and fatigue 01/29/2013  . Allergic rhinitis 10/09/2012  . Atrophic vaginitis 11/29/2011  . Depression 08/08/2011  . Hepatitis C   . Pancreatic cyst    Past Medical History  Diagnosis Date  . Hepatitis C   . Depression   . Pancreatic cyst   . Coronary artery disease     Mild   Past Surgical History  Procedure Laterality Date  . Foot surgery Left 04/28/2012    EXCISION OF PLANTAR FIBROMA , PLANTAR ASPECT LEFT ARCH  . Cesarean section      x3.  . Cardiovascular stress test  06/08/2009    No scintigraphic evidence of inducible myocardial ischemia.  . Transthoracic echocardiogram  06/08/2009    EF >55%, normal LV systolic function   History  Substance Use Topics  . Smoking status: Former Research scientist (life sciences)  . Smokeless tobacco: Never Used  . Alcohol Use: No   Family History  Problem Relation Age of Onset  . Arthritis Mother   . Heart disease Mother 52  . Stroke Mother 63    . Cancer Father     Colon cancer  . Stroke Father 75   Allergies  Allergen Reactions  . Codeine Nausea Only   Current Outpatient Prescriptions on File Prior to Visit  Medication Sig Dispense Refill  . buPROPion (WELLBUTRIN XL) 150 MG 24 hr tablet TAKE 1 TABLET (150 MG TOTAL) BY MOUTH DAILY.  30 tablet  2  . fluticasone (FLONASE) 50 MCG/ACT nasal spray Place 2 sprays into the nose daily.  16 g  6  . ibuprofen (ADVIL,MOTRIN) 600 MG tablet Take 600 mg by mouth every 6 (six) hours as needed.       No current facility-administered medications on file prior to visit.   The PMH, PSH, Social History, Family History, Medications, and allergies have been reviewed in [facility], and have been updated if relevant.   Review of Systems  Constitutional: Positive for fatigue and unexpected weight change. Negative for fever, activity change and appetite change.  HENT: Positive for congestion, rhinorrhea and sinus pressure.   Eyes: Negative for pain.  Respiratory: Negative for apnea, cough, choking, chest tightness, shortness of breath, wheezing and stridor.   Cardiovascular: Negative for chest pain.  All other systems reviewed and are negative.  Objective:   Physical Exam  Nursing note and vitals reviewed. Constitutional: She appears well-nourished. No distress.  HENT:  Head: Normocephalic and atraumatic.  Nose: Rhinorrhea and sinus tenderness present. No epistaxis.  No foreign bodies. Right sinus exhibits frontal sinus tenderness. Left sinus exhibits frontal sinus tenderness.  Eyes: Pupils are equal, round, and reactive to light.  Pulmonary/Chest: Effort normal.      BP 110/68  Pulse 76  Temp(Src) 98.4 F (36.9 C) (Oral)  Wt 149 lb (67.586 kg)  SpO2 96% Wt Readings from Last 3 Encounters:  01/29/13 149 lb (67.586 kg)  10/09/12 148 lb (67.132 kg)  08/14/12 144 lb (65.318 kg)         Assessment & Plan:

## 2013-01-29 NOTE — Assessment & Plan Note (Signed)
Given duration and progression of symptoms, will treat for bacterial sinusitis with amoxicillin. D/c flonase, start nasocort. Add allegra or claritin D. Call or return to clinic prn if these symptoms worsen or fail to improve as anticipated. The patient indicates understanding of these issues and agrees with the plan.

## 2013-01-29 NOTE — Assessment & Plan Note (Signed)
May be secondary to acute infection. Will check labs today to rule out other possible factors. The patient indicates understanding of these issues and agrees with the plan. Orders Placed This Encounter  Procedures  . US Soft Tissue Head/Neck  . TSH  . T4, Free  . CBC with Differential

## 2013-01-29 NOTE — Assessment & Plan Note (Signed)
Will order ultrasound to re evaluate mass. Discussed surgical referral for removal.  She will think about it.

## 2013-01-30 LAB — TSH: TSH: 2.287 u[IU]/mL (ref 0.350–4.500)

## 2013-01-30 LAB — T4, FREE: Free T4: 1.35 ng/dL (ref 0.80–1.80)

## 2013-02-01 ENCOUNTER — Encounter: Payer: Self-pay | Admitting: *Deleted

## 2013-02-05 ENCOUNTER — Ambulatory Visit
Admission: RE | Admit: 2013-02-05 | Discharge: 2013-02-05 | Disposition: A | Discharge status: Home / Self Care | Payer: Commercial Managed Care - PPO | Source: Ambulatory Visit | Attending: Family Medicine | Admitting: Family Medicine

## 2013-02-05 DIAGNOSIS — D179 Benign lipomatous neoplasm, unspecified: Secondary | ICD-10-CM

## 2013-02-10 ENCOUNTER — Other Ambulatory Visit: Payer: Self-pay | Admitting: Family Medicine

## 2013-02-10 DIAGNOSIS — D179 Benign lipomatous neoplasm, unspecified: Secondary | ICD-10-CM

## 2013-03-01 ENCOUNTER — Ambulatory Visit (INDEPENDENT_AMBULATORY_CARE_PROVIDER_SITE_OTHER): Payer: BC Managed Care – PPO | Admitting: General Surgery

## 2013-03-13 ENCOUNTER — Other Ambulatory Visit: Payer: Self-pay | Admitting: Family Medicine

## 2013-03-19 ENCOUNTER — Ambulatory Visit (INDEPENDENT_AMBULATORY_CARE_PROVIDER_SITE_OTHER): Payer: Commercial Managed Care - PPO | Admitting: General Surgery

## 2013-05-19 ENCOUNTER — Other Ambulatory Visit: Payer: Self-pay

## 2013-05-19 DIAGNOSIS — Z1231 Encounter for screening mammogram for malignant neoplasm of breast: Secondary | ICD-10-CM

## 2013-06-05 ENCOUNTER — Other Ambulatory Visit: Payer: Self-pay | Admitting: Family Medicine

## 2013-06-24 ENCOUNTER — Other Ambulatory Visit: Payer: Self-pay | Admitting: Obstetrics & Gynecology

## 2013-06-24 ENCOUNTER — Ambulatory Visit
Admission: RE | Admit: 2013-06-24 | Discharge: 2013-06-24 | Disposition: A | Discharge status: Home / Self Care | Payer: Commercial Managed Care - PPO | Source: Ambulatory Visit

## 2013-06-24 ENCOUNTER — Encounter (INDEPENDENT_AMBULATORY_CARE_PROVIDER_SITE_OTHER): Payer: Self-pay

## 2013-06-24 DIAGNOSIS — Z1231 Encounter for screening mammogram for malignant neoplasm of breast: Secondary | ICD-10-CM

## 2013-06-24 DIAGNOSIS — M8580 Other specified disorders of bone density and structure, unspecified site: Secondary | ICD-10-CM

## 2013-07-08 ENCOUNTER — Other Ambulatory Visit: Payer: Self-pay | Admitting: Family Medicine

## 2013-07-13 ENCOUNTER — Other Ambulatory Visit: Payer: Self-pay | Admitting: Gastroenterology

## 2013-07-13 DIAGNOSIS — B182 Chronic viral hepatitis C: Secondary | ICD-10-CM

## 2013-07-23 ENCOUNTER — Ambulatory Visit
Admission: RE | Admit: 2013-07-23 | Discharge: 2013-07-23 | Disposition: A | Discharge status: Home / Self Care | Payer: Commercial Managed Care - PPO | Source: Ambulatory Visit | Attending: Gastroenterology | Admitting: Gastroenterology

## 2013-07-23 DIAGNOSIS — B182 Chronic viral hepatitis C: Secondary | ICD-10-CM

## 2013-07-29 ENCOUNTER — Ambulatory Visit
Admission: RE | Admit: 2013-07-29 | Discharge: 2013-07-29 | Disposition: A | Discharge status: Home / Self Care | Payer: Commercial Managed Care - PPO | Source: Ambulatory Visit | Attending: Obstetrics & Gynecology | Admitting: Obstetrics & Gynecology

## 2013-07-29 ENCOUNTER — Encounter (INDEPENDENT_AMBULATORY_CARE_PROVIDER_SITE_OTHER): Payer: Self-pay

## 2013-07-29 DIAGNOSIS — M8580 Other specified disorders of bone density and structure, unspecified site: Secondary | ICD-10-CM

## 2013-08-09 ENCOUNTER — Other Ambulatory Visit: Payer: Self-pay | Admitting: Family Medicine

## 2013-08-09 NOTE — Telephone Encounter (Signed)
Lm on pts vm informing her OV is required for additional refills

## 2013-08-10 NOTE — Telephone Encounter (Signed)
Pt left vm returning call; pt request cb [phone].

## 2013-08-11 MED ORDER — BUPROPION HCL ER (XL) 150 MG PO TB24: ORAL_TABLET | ORAL | Status: DC

## 2013-08-11 NOTE — Addendum Note (Signed)
Addended by: Modena Nunnery on: 08/11/2013 10:27 AM   Modules accepted: Orders

## 2013-08-11 NOTE — Telephone Encounter (Signed)
Spoke to pt and f/u appt has been scheduled. Rx sent until appt

## 2013-08-20 ENCOUNTER — Ambulatory Visit (INDEPENDENT_AMBULATORY_CARE_PROVIDER_SITE_OTHER): Payer: Commercial Managed Care - PPO | Admitting: Family Medicine

## 2013-08-20 ENCOUNTER — Encounter: Payer: Self-pay | Admitting: Family Medicine

## 2013-08-20 VITALS — BP 110/68 | HR 78 | Temp 98.5°F | Ht 60.75 in | Wt 150.2 lb

## 2013-08-20 DIAGNOSIS — F329 Major depressive disorder, single episode, unspecified: Secondary | ICD-10-CM

## 2013-08-20 DIAGNOSIS — B182 Chronic viral hepatitis C: Secondary | ICD-10-CM

## 2013-08-20 DIAGNOSIS — F32A Depression, unspecified: Secondary | ICD-10-CM

## 2013-08-20 DIAGNOSIS — F3289 Other specified depressive episodes: Secondary | ICD-10-CM

## 2013-08-20 NOTE — Progress Notes (Signed)
Subjective:    Patient ID: Nicole Calhoun, female    DOB: [birth year], 58 y.o.   MRN: [id number]  HPI  58 yo pleasant female here for follow up.    Hep C- diagnosed over 20 years ago.  Followed by Dr. Earlean Shawl- last saw him on 07/02/2013- note reviewed..  Per pt, could not tolerate interferon treatment.  Just started new rx, Harvoni,  last night.  No side effects yet. Lab Results  Component Value Date   ALT 36* 08/12/2012   AST 42* 08/12/2012   ALKPHOS 53 08/12/2012   BILITOT 1.0 08/12/2012    Depression- has a h/o anxiety and depression. Also runs in her family. On wellbutrin 150 mg XL daily.  Sometimes tearful.  No SI or HI.  Has had more stressors lately.   Wt Readings from Last 3 Encounters:  08/20/13 150 lb 4 oz (68.153 kg)  01/29/13 149 lb (67.586 kg)  10/09/12 148 lb (67.132 kg)    Pancreatic cyst-  being followed by serial U/S which have been stable-  Last one was 07/23/2013.  CLINICAL DATA: History of hepatitis-C, assess for malignant change  EXAM:  ULTRASOUND ABDOMEN COMPLETE  COMPARISON: Abdominal ultrasound dated July 24, 2012.  FINDINGS:  Gallbladder:  No gallstones or wall thickening visualized. No sonographic Murphy  sign noted.  Common bile duct:  Diameter: 3.6 mm  Liver:  No focal lesion identified. Within normal limits in parenchymal  echogenicity.  IVC:  No abnormality visualized.  Pancreas:  Within the body of the pancreas there is a hypoechoic focus  measuring 1.1 x 0.8 x 0.8 cm. Previously the measurements were  approximately the same.  Spleen:  Size and appearance within normal limits.  Right Kidney:  Length: 11.0 cm. Echogenicity within normal limits. No mass or  hydronephrosis visualized.  Left Kidney:  Length: 11.3 cm. Echogenicity within normal limits. No mass or  hydronephrosis visualized.  Abdominal aorta:  No aneurysm visualized.  Other findings:  No ascites  IMPRESSION:  1. There is no acute abnormality of the liver.  2.  There is a stable hypoechoic focus in the mid body of the  pancreas most compatible with a cyst.  3. No acute abnormality is noted elsewhere.  Electronically Signed  By: David Martinique  On: 07/23/2013 08:40    Patient Active Problem List   Diagnosis Date Noted  . Lipoma 01/29/2013  . Acute sinus infection 01/29/2013  . Other malaise and fatigue 01/29/2013  . Allergic rhinitis 10/09/2012  . Atrophic vaginitis 11/29/2011  . Depression 08/08/2011  . Hepatitis C   . Pancreatic cyst    Past Medical History  Diagnosis Date  . Hepatitis C   . Depression   . Pancreatic cyst   . Coronary artery disease     Mild   Past Surgical History  Procedure Laterality Date  . Foot surgery Left 04/28/2012    EXCISION OF PLANTAR FIBROMA , PLANTAR ASPECT LEFT ARCH  . Cesarean section      x3.  . Cardiovascular stress test  06/08/2009    No scintigraphic evidence of inducible myocardial ischemia.  . Transthoracic echocardiogram  06/08/2009    EF >55%, normal LV systolic function   History  Substance Use Topics  . Smoking status: Former Research scientist (life sciences)  . Smokeless tobacco: Never Used  . Alcohol Use: No   Family History  Problem Relation Age of Onset  . Arthritis Mother   . Heart disease Mother 70  . Stroke Mother 63  . Cancer  Father     Colon cancer  . Stroke Father 105   Allergies  Allergen Reactions  . Codeine Nausea Only   Current Outpatient Prescriptions on File Prior to Visit  Medication Sig Dispense Refill  . buPROPion (WELLBUTRIN XL) 150 MG 24 hr tablet TAKE 1 TABLET BY MOUTH EVERY DAY  30 tablet  0  . fluticasone (FLONASE) 50 MCG/ACT nasal spray Place 2 sprays into the nose daily.  16 g  6  . ibuprofen (ADVIL,MOTRIN) 600 MG tablet Take 600 mg by mouth every 6 (six) hours as needed.       No current facility-administered medications on file prior to visit.   The PMH, PSH, Social History, Family History, Medications, and allergies have been reviewed in [facility], and have been updated if  relevant.   Review of Systems See HPI Patient reports no nausea, vomiting, abdominal pain, jaundice She has had more anxiety and depression- feels it is situational.  Denies any SI or HI. No easy bruising or bleeding No LE edema  Objective:   Physical Exam BP 110/68  Pulse 78  Temp(Src) 98.5 F (36.9 C) (Oral)  Ht 5' 0.75" (1.543 m)  Wt 150 lb 4 oz (68.153 kg)  BMI 28.63 kg/m2  SpO2 95%  General:  Well-developed,well-nourished,in no acute distress; alert,appropriate and cooperative throughout examination Head:  normocephalic and atraumatic.   Skin:  Intact without suspicious lesions or rashes Psych:  Cognition and judgment appear intact. Alert and cooperative with normal attention span and concentration. No apparent delusions, illusions, hallucinations     Assessment & Plan:

## 2013-08-20 NOTE — Assessment & Plan Note (Signed)
>  15 minutes spent in face to face time with patient, >50% spent in counselling or coordination of care. Deteriorated but she feels she would like to continue current dose of wellbutrin as it is mainly situational.  She will continue to update me.

## 2013-08-20 NOTE — Assessment & Plan Note (Signed)
Just started Harvoni. Followed by Dr. Earlean Shawl- I will continue to follow along.

## 2013-08-31 ENCOUNTER — Telehealth: Payer: Self-pay

## 2013-08-31 NOTE — Telephone Encounter (Signed)
Pt left v/m requesting Hep A & B immunization. Left v/m requesting pt cb.

## 2013-09-01 NOTE — Telephone Encounter (Signed)
Left vm for pt to call office

## 2013-09-07 NOTE — Telephone Encounter (Signed)
Pt went to [facility] and has gotten first Hep A&B vaccine; pt will f/u with [facility] for remaining course of vaccination.

## 2013-09-29 ENCOUNTER — Other Ambulatory Visit: Payer: Self-pay | Admitting: Family Medicine

## 2013-09-29 NOTE — Telephone Encounter (Signed)
Pt request refill wellbutrin to [facility]; pt said working well.advised pt done.

## 2013-11-29 ENCOUNTER — Other Ambulatory Visit: Payer: Self-pay | Admitting: *Deleted

## 2013-11-29 MED ORDER — BUPROPION HCL ER (XL) 150 MG PO TB24: ORAL_TABLET | ORAL | Status: DC

## 2014-02-24 ENCOUNTER — Other Ambulatory Visit: Payer: Self-pay | Admitting: Family Medicine

## 2014-02-24 NOTE — Telephone Encounter (Signed)
Pt called for status of refill wellbutrin; advised done and pt will ck with pharmacy.

## 2014-03-04 ENCOUNTER — Other Ambulatory Visit: Payer: Self-pay | Admitting: Family Medicine

## 2014-03-04 DIAGNOSIS — Z0000 Encounter for general adult medical examination without abnormal findings: Secondary | ICD-10-CM

## 2014-03-11 ENCOUNTER — Other Ambulatory Visit (INDEPENDENT_AMBULATORY_CARE_PROVIDER_SITE_OTHER): Payer: Commercial Managed Care - PPO

## 2014-03-11 DIAGNOSIS — Z0000 Encounter for general adult medical examination without abnormal findings: Secondary | ICD-10-CM | POA: Diagnosis not present

## 2014-03-11 LAB — CBC WITH DIFFERENTIAL/PLATELET
Basophils Absolute: 0 10*3/uL (ref 0.0–0.1)
Basophils Relative: 0.6 % (ref 0.0–3.0)
Eosinophils Absolute: 0.1 10*3/uL (ref 0.0–0.7)
Eosinophils Relative: 1.3 % (ref 0.0–5.0)
HCT: 41.4 % (ref 36.0–46.0)
Hemoglobin: 14.1 g/dL (ref 12.0–15.0)
Lymphocytes Relative: 38.7 % (ref 12.0–46.0)
Lymphs Abs: 2.1 10*3/uL (ref 0.7–4.0)
MCHC: 34.2 g/dL (ref 30.0–36.0)
MCV: 90 fl (ref 78.0–100.0)
Monocytes Absolute: 0.3 10*3/uL (ref 0.1–1.0)
Monocytes Relative: 6.2 % (ref 3.0–12.0)
Neutro Abs: 2.9 10*3/uL (ref 1.4–7.7)
Neutrophils Relative %: 53.2 % (ref 43.0–77.0)
Platelets: 200 10*3/uL (ref 150.0–400.0)
RBC: 4.6 Mil/uL (ref 3.87–5.11)
RDW: 12.9 % (ref 11.5–15.5)
WBC: 5.4 10*3/uL (ref 4.0–10.5)

## 2014-03-11 LAB — COMPREHENSIVE METABOLIC PANEL
ALT: 15 U/L (ref 0–35)
AST: 21 U/L (ref 0–37)
Albumin: 4 g/dL (ref 3.5–5.2)
Alkaline Phosphatase: 55 U/L (ref 39–117)
BUN: 17 mg/dL (ref 6–23)
CO2: 31 mEq/L (ref 19–32)
Calcium: 9.1 mg/dL (ref 8.4–10.5)
Chloride: 103 mEq/L (ref 96–112)
Creatinine, Ser: 0.83 mg/dL (ref 0.40–1.20)
GFR: 75 mL/min (ref >60.00)
Glucose, Bld: 99 mg/dL (ref 70–99)
Potassium: 4.1 meq/L (ref 3.5–5.1)
Sodium: 139 mEq/L (ref 135–145)
Total Bilirubin: 0.7 mg/dL (ref 0.2–1.2)
Total Protein: 7 g/dL (ref 6.0–8.3)

## 2014-03-11 LAB — LIPID PANEL
Cholesterol: 192 mg/dL (ref 0–200)
HDL: 60.4 mg/dL (ref >39.00)
LDL Cholesterol: 121 mg/dL — ABNORMAL HIGH (ref 0–99)
NonHDL: 131.6
Total CHOL/HDL Ratio: 3
Triglycerides: 53 mg/dL (ref 0.0–149.0)
VLDL: 10.6 mg/dL (ref 0.0–40.0)

## 2014-03-11 LAB — TSH: TSH: 1.9 u[IU]/mL (ref 0.35–4.50)

## 2014-03-17 ENCOUNTER — Encounter: Payer: Self-pay | Admitting: Family Medicine

## 2014-03-17 ENCOUNTER — Other Ambulatory Visit (HOSPITAL_COMMUNITY)
Admission: RE | Admit: 2014-03-17 | Discharge: 2014-03-17 | Disposition: A | Discharge status: Home / Self Care | Payer: Commercial Managed Care - PPO | Source: Ambulatory Visit | Attending: Family Medicine | Admitting: Family Medicine

## 2014-03-17 ENCOUNTER — Ambulatory Visit (INDEPENDENT_AMBULATORY_CARE_PROVIDER_SITE_OTHER): Payer: Commercial Managed Care - PPO | Admitting: Family Medicine

## 2014-03-17 VITALS — BP 120/70 | HR 75 | Temp 98.1°F | Ht 60.5 in | Wt 145.8 lb

## 2014-03-17 DIAGNOSIS — Z113 Encounter for screening for infections with a predominantly sexual mode of transmission: Secondary | ICD-10-CM | POA: Diagnosis not present

## 2014-03-17 DIAGNOSIS — F329 Major depressive disorder, single episode, unspecified: Secondary | ICD-10-CM

## 2014-03-17 DIAGNOSIS — Z01419 Encounter for gynecological examination (general) (routine) without abnormal findings: Secondary | ICD-10-CM | POA: Diagnosis not present

## 2014-03-17 DIAGNOSIS — M1990 Unspecified osteoarthritis, unspecified site: Secondary | ICD-10-CM | POA: Insufficient documentation

## 2014-03-17 DIAGNOSIS — N760 Acute vaginitis: Secondary | ICD-10-CM | POA: Insufficient documentation

## 2014-03-17 DIAGNOSIS — Z1151 Encounter for screening for human papillomavirus (HPV): Secondary | ICD-10-CM | POA: Insufficient documentation

## 2014-03-17 DIAGNOSIS — B182 Chronic viral hepatitis C: Secondary | ICD-10-CM

## 2014-03-17 DIAGNOSIS — F32A Depression, unspecified: Secondary | ICD-10-CM

## 2014-03-17 DIAGNOSIS — Z0000 Encounter for general adult medical examination without abnormal findings: Secondary | ICD-10-CM

## 2014-03-17 NOTE — Addendum Note (Signed)
Addended by: Modena Nunnery on: 03/17/2014 01:39 PM   Modules accepted: Orders

## 2014-03-17 NOTE — Progress Notes (Signed)
Pre visit review using our clinic review tool, if applicable. No additional management support is needed unless otherwise documented below in the visit note. 

## 2014-03-17 NOTE — Assessment & Plan Note (Signed)
No changes in rx today.

## 2014-03-17 NOTE — Assessment & Plan Note (Signed)
Reviewed preventive care protocols, scheduled due services, and updated immunizations Discussed nutrition, exercise, diet, and healthy lifestyle.  Pap smear today. 

## 2014-03-17 NOTE — Assessment & Plan Note (Signed)
Discussed tx options. Tylenol carefully given her history, although liver function currently normal and neg hep panel. Alleve as needed with food.

## 2014-03-17 NOTE — Progress Notes (Signed)
Subjective:   Patient ID: Nicole Calhoun, female    DOB: 12-29-1955, 59 y.o.   MRN: [id number]  Nicole Calhoun is a pleasant 59 y.o. year old female who presents to clinic today with Annual Exam and Arthritis  on 03/17/2014  HPI:  Doing well.  Followed by Dr. Earlean Shawl for Hep C- finished course of Harvoni several months ago and doing well. Mammogram 06/24/2013 Per pt, colonoscopy UTD- will request from Dr. Earlean Shawl. Sees Dr. Dellis Filbert but would like for me to take over pap smears. No h/o abnormal pap smears or post menopausal bleeding.  Influenza vaccine given at work in 11/2013.  Has been having more arthritis pain in her hands, morning stiffness and is asking what she can take.  Depression- feels Wellbutrin is working well.  Current Outpatient Prescriptions on File Prior to Visit  Medication Sig Dispense Refill  . buPROPion (WELLBUTRIN XL) 150 MG 24 hr tablet TAKE 1 TABLET BY MOUTH EVERY DAY 30 tablet 2  . fluticasone (FLONASE) 50 MCG/ACT nasal spray Place 2 sprays into the nose daily. 16 g 6  . ibuprofen (ADVIL,MOTRIN) 600 MG tablet Take 600 mg by mouth every 6 (six) hours as needed.     No current facility-administered medications on file prior to visit.    Allergies  Allergen Reactions  . Codeine Nausea Only    Past Medical History  Diagnosis Date  . Hepatitis C   . Depression   . Pancreatic cyst   . Coronary artery disease     Mild    Past Surgical History  Procedure Laterality Date  . Foot surgery Left 04/28/2012    EXCISION OF PLANTAR FIBROMA , PLANTAR ASPECT LEFT ARCH  . Cesarean section      x3.  . Cardiovascular stress test  06/08/2009    No scintigraphic evidence of inducible myocardial ischemia.  . Transthoracic echocardiogram  06/08/2009    EF >55%, normal LV systolic function    Family History  Problem Relation Age of Onset  . Arthritis Mother   . Heart disease Mother 69  . Stroke Mother 64  . Cancer Father     Colon cancer  . Stroke Father 39      History   Social History  . Marital Status: Married    Spouse Name: N/A  . Number of Children: N/A  . Years of Education: N/A   Occupational History  . Not on file.   Social History Main Topics  . Smoking status: Former Research scientist (life sciences)  . Smokeless tobacco: Never Used  . Alcohol Use: No  . Drug Use: No  . Sexual Activity: Not on file   Other Topics Concern  . Not on file   Social History Narrative   The PMH, PSH, Social History, Family History, Medications, and allergies have been reviewed in [facility], and have been updated if relevant.   Review of Systems  Constitutional: Negative.   HENT: Negative.   Eyes: Negative.   Respiratory: Negative.   Cardiovascular: Negative.   Gastrointestinal: Negative.   Endocrine: Negative.   Genitourinary: Negative.   Musculoskeletal: Positive for arthralgias.  Skin: Negative.   Allergic/Immunologic: Negative.   Neurological: Negative.   Hematological: Negative.   Psychiatric/Behavioral: Negative.   All other systems reviewed and are negative.      Objective:    BP 120/70 mmHg  Pulse 75  Temp(Src) 98.1 F (36.7 C) (Oral)  Ht 5' 0.5" (1.537 m)  Wt 145 lb 12 oz (66.112 kg)  BMI 27.99  kg/m2  SpO2 97%   Physical Exam    General:  Well-developed,well-nourished,in no acute distress; alert,appropriate and cooperative throughout examination Head:  normocephalic and atraumatic.   Eyes:  vision grossly intact, pupils equal, pupils round, and pupils reactive to light.   Ears:  R ear normal and L ear normal.   Nose:  no external deformity.   Mouth:  good dentition.   Neck:  No deformities, masses, or tenderness noted. Breasts:  No mass, nodules, thickening, tenderness, bulging, retraction, inflamation, nipple discharge or skin changes noted.   Lungs:  Normal respiratory effort, chest expands symmetrically. Lungs are clear to auscultation, no crackles or wheezes. Heart:  Normal rate and regular rhythm. S1 and S2 normal without gallop,  murmur, click, rub or other extra sounds. Abdomen:  Bowel sounds positive,abdomen soft and non-tender without masses, organomegaly or hernias noted. Rectal:  no external abnormalities.   Genitalia:  Pelvic Exam:        External: normal female genitalia without lesions or masses        Vagina: normal without lesions or masses        Cervix: normal without lesions or masses        Adnexa: normal bimanual exam without masses or fullness        Uterus: normal by palpation        Pap smear: performed Msk:  No deformity or scoliosis noted of thoracic or lumbar spine.   Extremities:  No clubbing, cyanosis, edema, or deformity noted with normal full range of motion of all joints.   Neurologic:  alert & oriented X3 and gait normal.   Skin:  Intact without suspicious lesions or rashes Cervical Nodes:  No lymphadenopathy noted Axillary Nodes:  No palpable lymphadenopathy Psych:  Cognition and judgment appear intact. Alert and cooperative with normal attention span and concentration. No apparent delusions, illusions, hallucinations      Assessment & Plan:   Well woman exam with routine gynecological exam  Depression  Chronic hepatitis C without hepatic coma No Follow-up on file.

## 2014-03-17 NOTE — Assessment & Plan Note (Signed)
Doing well s/p Harvoni. Followed by Dr. Earlean Shawl.

## 2014-03-18 LAB — CYTOLOGY - PAP

## 2014-03-19 LAB — CERVICOVAGINAL ANCILLARY ONLY
Bacterial vaginitis: NEGATIVE
Candida vaginitis: NEGATIVE

## 2014-03-21 ENCOUNTER — Encounter: Payer: Self-pay | Admitting: *Deleted

## 2014-03-21 LAB — CERVICOVAGINAL ANCILLARY ONLY: Herpes: NEGATIVE

## 2014-03-25 ENCOUNTER — Other Ambulatory Visit: Payer: Self-pay

## 2014-03-25 DIAGNOSIS — Z1231 Encounter for screening mammogram for malignant neoplasm of breast: Secondary | ICD-10-CM

## 2014-05-18 ENCOUNTER — Other Ambulatory Visit: Payer: Self-pay | Admitting: Family Medicine

## 2014-06-17 ENCOUNTER — Emergency Department (HOSPITAL_COMMUNITY)
Admission: EM | Admit: 2014-06-17 | Discharge: 2014-06-17 | Disposition: A | Discharge status: Home / Self Care | Payer: Commercial Managed Care - PPO | Attending: Emergency Medicine | Admitting: Emergency Medicine

## 2014-06-17 ENCOUNTER — Emergency Department (HOSPITAL_COMMUNITY): Payer: Commercial Managed Care - PPO

## 2014-06-17 ENCOUNTER — Encounter (HOSPITAL_COMMUNITY): Payer: Self-pay | Admitting: Emergency Medicine

## 2014-06-17 DIAGNOSIS — M1990 Unspecified osteoarthritis, unspecified site: Secondary | ICD-10-CM | POA: Diagnosis not present

## 2014-06-17 DIAGNOSIS — S42211A Unspecified displaced fracture of surgical neck of right humerus, initial encounter for closed fracture: Secondary | ICD-10-CM | POA: Diagnosis not present

## 2014-06-17 DIAGNOSIS — W19XXXA Unspecified fall, initial encounter: Secondary | ICD-10-CM

## 2014-06-17 DIAGNOSIS — Z8619 Personal history of other infectious and parasitic diseases: Secondary | ICD-10-CM | POA: Insufficient documentation

## 2014-06-17 DIAGNOSIS — Y998 Other external cause status: Secondary | ICD-10-CM | POA: Diagnosis not present

## 2014-06-17 DIAGNOSIS — Y9289 Other specified places as the place of occurrence of the external cause: Secondary | ICD-10-CM | POA: Diagnosis not present

## 2014-06-17 DIAGNOSIS — Y9389 Activity, other specified: Secondary | ICD-10-CM | POA: Diagnosis not present

## 2014-06-17 DIAGNOSIS — Z87891 Personal history of nicotine dependence: Secondary | ICD-10-CM | POA: Insufficient documentation

## 2014-06-17 DIAGNOSIS — W1839XA Other fall on same level, initial encounter: Secondary | ICD-10-CM | POA: Insufficient documentation

## 2014-06-17 DIAGNOSIS — Z7951 Long term (current) use of inhaled steroids: Secondary | ICD-10-CM | POA: Diagnosis not present

## 2014-06-17 DIAGNOSIS — Z8719 Personal history of other diseases of the digestive system: Secondary | ICD-10-CM | POA: Diagnosis not present

## 2014-06-17 DIAGNOSIS — I2510 Atherosclerotic heart disease of native coronary artery without angina pectoris: Secondary | ICD-10-CM | POA: Diagnosis not present

## 2014-06-17 DIAGNOSIS — H9190 Unspecified hearing loss, unspecified ear: Secondary | ICD-10-CM | POA: Diagnosis not present

## 2014-06-17 DIAGNOSIS — S4991XA Unspecified injury of right shoulder and upper arm, initial encounter: Secondary | ICD-10-CM | POA: Diagnosis present

## 2014-06-17 DIAGNOSIS — F329 Major depressive disorder, single episode, unspecified: Secondary | ICD-10-CM | POA: Insufficient documentation

## 2014-06-17 DIAGNOSIS — S42201A Unspecified fracture of upper end of right humerus, initial encounter for closed fracture: Secondary | ICD-10-CM

## 2014-06-17 HISTORY — DX: Unspecified hearing loss, unspecified ear: H91.90

## 2014-06-17 HISTORY — DX: Unspecified osteoarthritis, unspecified site: M19.90

## 2014-06-17 MED ORDER — ONDANSETRON HCL 4 MG PO TABS: 4.0000 mg | ORAL_TABLET | Freq: Four times a day (QID) | ORAL | Status: DC

## 2014-06-17 MED ORDER — OXYCODONE-ACETAMINOPHEN 5-325 MG PO TABS: 2.0000 | ORAL_TABLET | ORAL | Status: DC | PRN

## 2014-06-17 MED ORDER — HYDROMORPHONE HCL 1 MG/ML IJ SOLN
1.0000 mg | Freq: Once | INTRAMUSCULAR | Status: AC
  Administered 2014-06-17: 1 mg via INTRAVENOUS
  Filled 2014-06-17: qty 1

## 2014-06-17 NOTE — Discharge Instructions (Signed)
Humerus Fracture, Treated with Immobilization Follow up with orthopedics.  Take percocet for pain.  The humerus is the large bone in the upper arm. A broken (fractured) humerus is often treated by wearing a cast, splint, or sling (immobilization). This holds the broken pieces in place so they can heal.  HOME CARE  Put ice on the injured area.  Put ice in a plastic bag.  Place a towel between your skin and the bag.  Leave the ice on for 15-20 minutes, 03-04 times a day.  If you are given a cast:  Do not scratch the skin under the cast.  Check the skin around the cast every day. You may put lotion on any red or sore areas.  Keep the cast dry and clean.  If you are given a splint:  Wear the splint as told.  Keep the splint clean and dry.  Loosen the elastic around the splint if your fingers become numb, cold, tingle, or turn blue.  If you are given a sling:  Wear the sling as told.  Do not put pressure on any part of the cast or splint until it is fully hardened.  The cast or splint must be protected with a plastic bag during bathing. Do not lower the cast or splint into water.  Only take medicine as told by your doctor.  Do exercises as told by your doctor.  Follow up as told by your doctor. GET HELP RIGHT AWAY IF:   Your skin or fingernails turn blue or gray.  Your arm feels cold or numb.  You have very bad pain in the injured arm.  You are having problems with the medicines you were given. MAKE SURE YOU:   Understand these instructions.  Will watch your condition.  Will get help right away if you are not doing well or get worse. Document Released: 06/19/2007 Document Revised: 03/25/2011 Document Reviewed: 02/14/2010 [facility] Patient Information 2015 [place], [place]. This information is not intended to replace advice given to you by your health care provider. Make sure you discuss any questions you have with your health care provider.

## 2014-06-17 NOTE — ED Notes (Signed)
[address] this am, caught self with right hand-- right upper arm deformed. Positive radial pulse

## 2014-06-17 NOTE — ED Provider Notes (Signed)
CSN: [id number]     Arrival date & time 06/17/14  1242 History  This chart was scribed for non-physician practitioner, Ottie Glazier, PA-C working with Ernestina Patches, MD by Hansel Feinstein, ED scribe. This patient was seen in room TR11C/TR11C and the patient's care was started at 1:34 PM    Chief Complaint  Patient presents with  . Fall  . Arm Injury   The history is provided by the patient and the spouse. No language interpreter was used.    HPI Comments: Nicole Calhoun is a 59 y.o. female who presents to the Emergency Department complaining of gradual onset, moderate right shoulder pain as a result of a fall that occurred 2 hours ago. She states that she was pulled down by her dog and landed on her hands and knees. Pt states she temporarily had difficulty ambulating after the incident. Upon admission to the ED, cold compress was applied with mild relief. Pt states her last meal was at 10 am. She denies LOC or head injury. She also denies numbness, back pain, and neck pain.     Past Medical History  Diagnosis Date  . Hepatitis C   . Depression   . Pancreatic cyst   . Coronary artery disease     Mild  . Hard of hearing   . Arthritis    Past Surgical History  Procedure Laterality Date  . Foot surgery Left 04/28/2012    EXCISION OF PLANTAR FIBROMA , PLANTAR ASPECT LEFT ARCH  . Cesarean section      x3.  . Cardiovascular stress test  06/08/2009    No scintigraphic evidence of inducible myocardial ischemia.  . Transthoracic echocardiogram  06/08/2009    EF >55%, normal LV systolic function   Family History  Problem Relation Age of Onset  . Arthritis Mother   . Heart disease Mother 47  . Stroke Mother 46  . Cancer Father     Colon cancer  . Stroke Father 54   History  Substance Use Topics  . Smoking status: Former Research scientist (life sciences)  . Smokeless tobacco: Never Used  . Alcohol Use: No   OB History    No data available     Review of Systems  Musculoskeletal: Positive for  arthralgias. Negative for back pain and neck pain.  Skin: Negative for wound.  Neurological: Negative for dizziness, syncope and numbness.      Allergies  Codeine  Home Medications   Prior to Admission medications   Medication Sig Start Date End Date Taking? Authorizing Provider  buPROPion (WELLBUTRIN XL) 150 MG 24 hr tablet TAKE 1 TABLET BY MOUTH EVERY DAY 05/18/14   Lucille Passy, MD  fluticasone [facility]) 50 MCG/ACT nasal spray Place 2 sprays into the nose daily. 10/09/12   Amy Cletis Athens, MD  ibandronate (BONIVA) 150 MG tablet Take 150 mg by mouth every 30 (thirty) days. Take in the morning with a full glass of water, on an empty stomach, and do not take anything else by mouth or lie down for the next 30 min.    Historical Provider, MD  ibuprofen (ADVIL,MOTRIN) 600 MG tablet Take 600 mg by mouth every 6 (six) hours as needed.    Historical Provider, MD  ondansetron (ZOFRAN) 4 MG tablet Take 1 tablet (4 mg total) by mouth every 6 (six) hours. 06/17/14   Rohil Lesch Patel-Mills, PA-C  oxyCODONE-acetaminophen (PERCOCET/ROXICET) 5-325 MG per tablet Take 2 tablets by mouth every 4 (four) hours as needed for severe pain. 06/17/14  Posey Petrik Patel-Mills, PA-C   Triage VS: BP 108/85 mmHg  Pulse 89  Temp(Src) 97.7 F (36.5 C) (Oral)  Resp 16  Ht 5\' 1"  (1.549 m)  Wt 155 lb (70.308 kg)  BMI 29.30 kg/m2  SpO2 100% Physical Exam  Constitutional: She is oriented to person, place, and time. She appears well-developed and well-nourished. No distress.  HENT:  Head: Normocephalic and atraumatic.  Mouth/Throat: Oropharynx is clear and moist.  Eyes: Conjunctivae and EOM are normal. Pupils are equal, round, and reactive to light.  Neck: Normal range of motion. Neck supple. No tracheal deviation present.  Cardiovascular: Normal rate and normal heart sounds.  Exam reveals no gallop and no friction rub.   No murmur heard. Pulmonary/Chest: Breath sounds normal. No respiratory distress.  Abdominal: Soft. She  exhibits no distension. There is no tenderness. There is no rebound and no guarding.  Musculoskeletal: Normal range of motion. She exhibits no edema or tenderness.  Right upper extremity: TTP along the right humeral head. She is unable to abduct and adduct the shoulder. No ecchymosis, erythema, edema, or deformity. Good radial pulse. NVI. Good sensation throughout the arm and fingers. Good grip strength. Good ROM to right wrist and elbow. Good cap refill. Able to flex and extend all fingers. Able to extend at the elbow, but will not flex elbow. No deformity or skin tenting.    Neurological: She is alert and oriented to person, place, and time.  Skin: Skin is warm and dry.  Psychiatric: She has a normal mood and affect. Her behavior is normal.  Nursing note and vitals reviewed.   ED Course  Procedures (including critical care time) DIAGNOSTIC STUDIES: Oxygen Saturation is 100% on RA, normal by my interpretation.    COORDINATION OF CARE: 1:42 PM Discussed treatment plan with pt at bedside and pt agreed to plan.   Labs Review Labs Reviewed - No data to display  Imaging Review Dg Shoulder Right  06/17/2014   CLINICAL DATA:  Initial encounter for fall today landing on right arm. Now with right shoulder pain.  EXAM: RIGHT SHOULDER - 2+ VIEW  COMPARISON:  None.  FINDINGS: Two views study shows a comminuted fracture of the right humeral neck. Greater tuberosity exists as a free fragment. No associated glenoid fracture. Acromioclavicular distance is preserved.  IMPRESSION: Comminuted proximal humerus fracture.   Electronically Signed   By: Misty Stanley M.D.   On: 06/17/2014 14:31   Dg Forearm Right  06/17/2014   CLINICAL DATA:  Acute right forearm pain after fall today. Initial encounter.  EXAM: RIGHT FOREARM - 2 VIEW  COMPARISON:  None.  FINDINGS: There is no evidence of fracture or other focal bone lesions. Soft tissues are unremarkable.  IMPRESSION: Normal right forearm.   Electronically Signed    By: Marijo Conception, M.D.   On: 06/17/2014 14:31   Dg Humerus Right  06/17/2014   CLINICAL DATA:  Trauma. Fall today. Right arm pain. Initial encounter.  EXAM: RIGHT HUMERUS - 2+ VIEW  COMPARISON:  Right shoulder radiograph from the same day.  FINDINGS: A comminuted fracture involves the surgical neck of the right humerus and right humeral head. The distal humerus is intact. A free fragment involves the greater tuberosity. The right hemi thorax and scapula are intact.  IMPRESSION: 1. Comminuted fracture involving the right humeral head and surgical neck.   Electronically Signed   By: San Morelle M.D.   On: 06/17/2014 14:31     EKG Interpretation None  MDM   Final diagnoses:  Fall  Fracture, humerus, proximal, right, closed, initial encounter  Patient presents for right upper extremity pain along the humeral head.  She xray she has a comminuted proximal humerus fracture with fragment involving the greater tuberosity. The scapula is in tact. She has good sensation and radial pulse.   Medications  HYDROmorphone (DILAUDID) injection 1 mg (1 mg Intravenous Given 06/17/14 1359)  I have give the patient pain medications, zofran, and ortho referral as well as a right arm sling. Patient and husband verbally agree with the plan.   I personally performed the services described in this documentation, which was scribed in my presence. The recorded information has been reviewed and is accurate.   Ottie Glazier, PA-C 06/18/14 1219  Ernestina Patches, MD 06/20/14 [phone]

## 2014-06-21 ENCOUNTER — Other Ambulatory Visit: Payer: Self-pay | Admitting: Orthopedic Surgery

## 2014-06-21 ENCOUNTER — Ambulatory Visit
Admission: RE | Admit: 2014-06-21 | Discharge: 2014-06-21 | Disposition: A | Discharge status: Home / Self Care | Payer: Commercial Managed Care - PPO | Source: Ambulatory Visit | Attending: Orthopedic Surgery | Admitting: Orthopedic Surgery

## 2014-06-21 DIAGNOSIS — S42301A Unspecified fracture of shaft of humerus, right arm, initial encounter for closed fracture: Secondary | ICD-10-CM

## 2014-06-21 DIAGNOSIS — S42201A Unspecified fracture of upper end of right humerus, initial encounter for closed fracture: Secondary | ICD-10-CM

## 2014-06-21 HISTORY — DX: Unspecified fracture of upper end of right humerus, initial encounter for closed fracture: S42.201A

## 2014-06-28 ENCOUNTER — Other Ambulatory Visit: Payer: Self-pay | Admitting: Orthopedic Surgery

## 2014-07-04 ENCOUNTER — Encounter (HOSPITAL_COMMUNITY)
Admission: RE | Admit: 2014-07-04 | Discharge: 2014-07-04 | Disposition: A | Discharge status: Home / Self Care | Payer: Commercial Managed Care - PPO | Source: Ambulatory Visit | Attending: Orthopedic Surgery | Admitting: Orthopedic Surgery

## 2014-07-04 ENCOUNTER — Encounter (HOSPITAL_COMMUNITY): Payer: Self-pay

## 2014-07-04 DIAGNOSIS — Z87891 Personal history of nicotine dependence: Secondary | ICD-10-CM | POA: Diagnosis not present

## 2014-07-04 DIAGNOSIS — S42291A Other displaced fracture of upper end of right humerus, initial encounter for closed fracture: Secondary | ICD-10-CM | POA: Diagnosis not present

## 2014-07-04 DIAGNOSIS — M797 Fibromyalgia: Secondary | ICD-10-CM | POA: Diagnosis not present

## 2014-07-04 DIAGNOSIS — G8929 Other chronic pain: Secondary | ICD-10-CM | POA: Diagnosis not present

## 2014-07-04 DIAGNOSIS — M1389 Other specified arthritis, multiple sites: Secondary | ICD-10-CM | POA: Diagnosis not present

## 2014-07-04 DIAGNOSIS — Y9389 Activity, other specified: Secondary | ICD-10-CM | POA: Diagnosis not present

## 2014-07-04 DIAGNOSIS — B1920 Unspecified viral hepatitis C without hepatic coma: Secondary | ICD-10-CM | POA: Diagnosis not present

## 2014-07-04 DIAGNOSIS — Z885 Allergy status to narcotic agent status: Secondary | ICD-10-CM | POA: Diagnosis not present

## 2014-07-04 DIAGNOSIS — Y9289 Other specified places as the place of occurrence of the external cause: Secondary | ICD-10-CM | POA: Diagnosis not present

## 2014-07-04 DIAGNOSIS — I252 Old myocardial infarction: Secondary | ICD-10-CM | POA: Diagnosis not present

## 2014-07-04 DIAGNOSIS — I2510 Atherosclerotic heart disease of native coronary artery without angina pectoris: Secondary | ICD-10-CM | POA: Diagnosis not present

## 2014-07-04 DIAGNOSIS — W19XXXA Unspecified fall, initial encounter: Secondary | ICD-10-CM | POA: Diagnosis not present

## 2014-07-04 DIAGNOSIS — M545 Low back pain: Secondary | ICD-10-CM | POA: Diagnosis not present

## 2014-07-04 DIAGNOSIS — G4730 Sleep apnea, unspecified: Secondary | ICD-10-CM | POA: Diagnosis not present

## 2014-07-04 DIAGNOSIS — F329 Major depressive disorder, single episode, unspecified: Secondary | ICD-10-CM | POA: Diagnosis not present

## 2014-07-04 DIAGNOSIS — K219 Gastro-esophageal reflux disease without esophagitis: Secondary | ICD-10-CM | POA: Diagnosis not present

## 2014-07-04 DIAGNOSIS — Y998 Other external cause status: Secondary | ICD-10-CM | POA: Diagnosis not present

## 2014-07-04 HISTORY — DX: Gastro-esophageal reflux disease without esophagitis: K21.9

## 2014-07-04 HISTORY — DX: Nausea with vomiting, unspecified: R11.2

## 2014-07-04 HISTORY — DX: Cardiac arrhythmia, unspecified: I49.9

## 2014-07-04 HISTORY — DX: Other specified postprocedural states: Z98.890

## 2014-07-04 HISTORY — DX: Sleep apnea, unspecified: G47.30

## 2014-07-04 HISTORY — DX: Personal history of other diseases of the digestive system: Z87.19

## 2014-07-04 HISTORY — DX: Fibromyalgia: M79.7

## 2014-07-04 LAB — CBC
HCT: 38.1 % (ref 36.0–46.0)
Hemoglobin: 12.5 g/dL (ref 12.0–15.0)
MCH: 30.9 pg (ref 26.0–34.0)
MCHC: 32.8 g/dL (ref 30.0–36.0)
MCV: 94.1 fL (ref 78.0–100.0)
Platelets: 270 10*3/uL (ref 150–400)
RBC: 4.05 MIL/uL (ref 3.87–5.11)
RDW: 13.2 % (ref 11.5–15.5)
WBC: 5 10*3/uL (ref 4.0–10.5)

## 2014-07-04 LAB — COMPREHENSIVE METABOLIC PANEL
ALT: 14 U/L (ref 14–54)
AST: 24 U/L (ref 15–41)
Albumin: 3.4 g/dL — ABNORMAL LOW (ref 3.5–5.0)
Alkaline Phosphatase: 79 U/L (ref 38–126)
Anion gap: 9 (ref 5–15)
BUN: 12 mg/dL (ref 6–20)
CO2: 28 mmol/L (ref 22–32)
Calcium: 9.3 mg/dL (ref 8.9–10.3)
Chloride: 104 mmol/L (ref 101–111)
Creatinine, Ser: 0.62 mg/dL (ref 0.44–1.00)
GFR calc Af Amer: >60 mL/min (ref >60)
GFR calc non Af Amer: >60 mL/min (ref >60)
Glucose, Bld: 108 mg/dL — ABNORMAL HIGH (ref 65–99)
Potassium: 4.2 mmol/L (ref 3.5–5.1)
Sodium: 141 mmol/L (ref 135–145)
Total Bilirubin: 1.4 mg/dL — ABNORMAL HIGH (ref 0.3–1.2)
Total Protein: 6.8 g/dL (ref 6.5–8.1)

## 2014-07-04 LAB — SURGICAL PCR SCREEN
MRSA, PCR: NEGATIVE
Staphylococcus aureus: NEGATIVE

## 2014-07-04 MED ORDER — CEFAZOLIN SODIUM-DEXTROSE 2-3 GM-% IV SOLR
2.0000 g | INTRAVENOUS | Status: AC
  Administered 2014-07-05: 2 g via INTRAVENOUS
  Filled 2014-07-04: qty 50

## 2014-07-04 NOTE — Progress Notes (Addendum)
PCP is Dr Arnette Norris States she saw a cardiologist many years ago, last  in 2011. Doesn't remember the Drs name, but the Dr was with [facility] cardiology. Denies ever having a card cath, and doesn't remember having an echo. States she remembers having a stress test many years ago.

## 2014-07-04 NOTE — Progress Notes (Signed)
Anesthesia Chart Review:  Pt is 59 year old female scheduled for ORIF R proximal humerus fracture on 07/05/2014 with Dr. Tamera Punt.   PMH includes: CAD, hepatitis C, dysrhythmia, OSA, GERD. Former smoker. BMI 28.  Preoperative labs reviewed.    EKG 07/04/2014: NSR. Possible Anterior infarct, age undetermined  Echo 05/16/8880: -LV systolic function is normal, EF =>55% -The transmitral spectral Doppler flow pattern is normal for age -no regional wall motion abnormalities noted -RV systolic pressure is normal -no significant valvular disease seen  Nuclear stress test 06/08/2009: -post stress myocardial perfusion images show a normal pattern of perfusion in all regions. The post stress LV is normal in size. There is no scintigraphic evidence of inducible myocardial ischemia -The post-stress EF is 80%. Global LV systolic function is normal. No significant wall motion abnormalities noted.  -Exercise capacity 10 METS. Pharmacological stress test without chest pain or diagnostic EKG changes for ischemia.  -Normal myocardial perfusion study. This is a low risk scan.   Pt's records include a scanned handwritten note from a cardiac catheterization from 2001 from [facility], but there is no cardiac cath report.  On the handwritten note, the name stamped in the corner is nearly illegible but the letters visible do not match the name Arizbeth Cawthorn, nor do the parts of the DOB that are legible match the patient's DOB.  I spoke with the pt by phone, and she vehemently denies ever having a cath. We discussed the process of the cath procedure and she insists she's never had a cath. I believe this handwritten note of a 2001 catheterization belongs to a different patient.   Reviewed case with Dr. Lissa Hoard.   If no changes, I anticipate pt can proceed with surgery as scheduled.   Willeen Cass, FNP-BC [facility]/Anesthesiology Phone: [phone] 07/04/2014 12:51 PM

## 2014-07-04 NOTE — Pre-Procedure Instructions (Signed)
VIELKA KLINEDINST  07/04/2014      [id number] - [place], [place] - [address] [place] [address] Phone: [phone] Fax: [phone]  [id number] - [place], [place] [address] Phone: [phone] Fax: [phone]  [id number] [place], [place] [address] [place] [address] Phone: [phone] Fax: [phone]  [id number] - , [place] [address] [facility] [place] [address] [place] [address] Phone: [phone] Fax: [phone]    Your procedure is scheduled on June 21  Report to [place] at [phone].  Call this number if you have problems the morning of surgery:  [phone]   Remember:  Do not eat food or drink liquids after midnight.  Take these medicines the morning of surgery with A SIP OF WATER Wellbutrin Xl, Flonase if needed, Zofran if needed, Percocet if needed   Do not wear jewelry, make-up or nail polish.  Do not wear lotions, powders, or perfumes.  You may wear deodorant.  Do not shave 48 hours prior to surgery.  Men may shave face and neck.  Do not bring valuables to the hospital.  [facility] is not responsible for any belongings or valuables.  Contacts, dentures or bridgework may not be worn into surgery.  Leave your suitcase in the car.  After surgery it may be brought to your room.  For patients admitted to the hospital, discharge time will be determined by your treatment team.  Patients discharged the day of surgery will not be allowed to drive home.    Special instructions:  [place] - Preparing for Surgery  Before surgery, you can play an important role.  Because skin is not sterile, your skin needs to be as free of germs as possible.  You can reduce the number of germs on you skin by washing with CHG (chlorahexidine gluconate) soap  before surgery.  CHG is an antiseptic cleaner which kills germs and bonds with the skin to continue killing germs even after washing.  Please DO NOT use if you have an allergy to CHG or antibacterial soaps.  If your skin becomes reddened/irritated stop using the CHG and inform your nurse when you arrive at Short Stay.  Do not shave (including legs and underarms) for at least 48 hours prior to the first CHG shower.  You may shave your face.  Please follow these instructions carefully:   1.  Shower with CHG Soap the night before surgery and the                                morning of Surgery.  2.  If you choose to wash your hair, wash your hair first as usual with your       normal shampoo.  3.  After you shampoo, rinse your hair and body thoroughly to remove the                      Shampoo.  4.  Use CHG as you would any other liquid soap.  You can apply chg directly       to the skin and wash gently with scrungie or a clean washcloth.  5.  Apply the CHG Soap to your body ONLY FROM THE NECK DOWN.  Do not use on open wounds or open sores.  Avoid contact with your eyes,       ears, mouth and genitals (private parts).  Wash genitals (private parts)       with your normal soap.  6.  Wash thoroughly, paying special attention to the area where your surgery        will be performed.  7.  Thoroughly rinse your body with warm water from the neck down.  8.  DO NOT shower/wash with your normal soap after using and rinsing off       the CHG Soap.  9.  Pat yourself dry with a clean towel.            10.  Wear clean pajamas.            11.  Place clean sheets on your bed the night of your first shower and do not        sleep with pets.  Day of Surgery  Do not apply any lotions/deoderants the morning of surgery.  Please wear clean clothes to the hospital/surgery center.     Please read over the following fact sheets that you were given. Pain Booklet, Coughing and Deep Breathing, MRSA Information  and Surgical Site Infection Prevention

## 2014-07-05 ENCOUNTER — Ambulatory Visit (HOSPITAL_COMMUNITY): Payer: Commercial Managed Care - PPO | Admitting: Anesthesiology

## 2014-07-05 ENCOUNTER — Encounter (HOSPITAL_COMMUNITY): Payer: Self-pay | Admitting: Surgery

## 2014-07-05 ENCOUNTER — Encounter (HOSPITAL_COMMUNITY)
Admission: AD | Disposition: A | Discharge status: Home / Self Care | Payer: Self-pay | Source: Ambulatory Visit | Attending: Orthopedic Surgery

## 2014-07-05 ENCOUNTER — Observation Stay (HOSPITAL_COMMUNITY)
Admission: AD | Admit: 2014-07-05 | Discharge: 2014-07-06 | Disposition: A | Discharge status: Home / Self Care | Payer: Commercial Managed Care - PPO | Source: Ambulatory Visit | Attending: Orthopedic Surgery | Admitting: Orthopedic Surgery

## 2014-07-05 ENCOUNTER — Ambulatory Visit (HOSPITAL_COMMUNITY): Payer: Commercial Managed Care - PPO | Admitting: Emergency Medicine

## 2014-07-05 ENCOUNTER — Ambulatory Visit (HOSPITAL_COMMUNITY): Payer: Commercial Managed Care - PPO

## 2014-07-05 DIAGNOSIS — I2510 Atherosclerotic heart disease of native coronary artery without angina pectoris: Secondary | ICD-10-CM | POA: Insufficient documentation

## 2014-07-05 DIAGNOSIS — Y998 Other external cause status: Secondary | ICD-10-CM | POA: Insufficient documentation

## 2014-07-05 DIAGNOSIS — F329 Major depressive disorder, single episode, unspecified: Secondary | ICD-10-CM | POA: Insufficient documentation

## 2014-07-05 DIAGNOSIS — G4730 Sleep apnea, unspecified: Secondary | ICD-10-CM | POA: Insufficient documentation

## 2014-07-05 DIAGNOSIS — M545 Low back pain: Secondary | ICD-10-CM | POA: Insufficient documentation

## 2014-07-05 DIAGNOSIS — Z419 Encounter for procedure for purposes other than remedying health state, unspecified: Secondary | ICD-10-CM

## 2014-07-05 DIAGNOSIS — S42291A Other displaced fracture of upper end of right humerus, initial encounter for closed fracture: Secondary | ICD-10-CM | POA: Diagnosis not present

## 2014-07-05 DIAGNOSIS — Z87891 Personal history of nicotine dependence: Secondary | ICD-10-CM | POA: Insufficient documentation

## 2014-07-05 DIAGNOSIS — W19XXXA Unspecified fall, initial encounter: Secondary | ICD-10-CM | POA: Insufficient documentation

## 2014-07-05 DIAGNOSIS — S42209A Unspecified fracture of upper end of unspecified humerus, initial encounter for closed fracture: Secondary | ICD-10-CM | POA: Diagnosis present

## 2014-07-05 DIAGNOSIS — M797 Fibromyalgia: Secondary | ICD-10-CM | POA: Insufficient documentation

## 2014-07-05 DIAGNOSIS — Y9289 Other specified places as the place of occurrence of the external cause: Secondary | ICD-10-CM | POA: Insufficient documentation

## 2014-07-05 DIAGNOSIS — Y9389 Activity, other specified: Secondary | ICD-10-CM | POA: Insufficient documentation

## 2014-07-05 DIAGNOSIS — K219 Gastro-esophageal reflux disease without esophagitis: Secondary | ICD-10-CM | POA: Insufficient documentation

## 2014-07-05 DIAGNOSIS — Z885 Allergy status to narcotic agent status: Secondary | ICD-10-CM | POA: Insufficient documentation

## 2014-07-05 DIAGNOSIS — I252 Old myocardial infarction: Secondary | ICD-10-CM | POA: Insufficient documentation

## 2014-07-05 DIAGNOSIS — M1389 Other specified arthritis, multiple sites: Secondary | ICD-10-CM | POA: Insufficient documentation

## 2014-07-05 DIAGNOSIS — B1920 Unspecified viral hepatitis C without hepatic coma: Secondary | ICD-10-CM | POA: Insufficient documentation

## 2014-07-05 DIAGNOSIS — G8929 Other chronic pain: Secondary | ICD-10-CM | POA: Insufficient documentation

## 2014-07-05 HISTORY — DX: Low back pain: M54.5

## 2014-07-05 HISTORY — DX: Low back pain, unspecified: M54.50

## 2014-07-05 HISTORY — DX: Other chronic pain: G89.29

## 2014-07-05 HISTORY — PX: ORIF HUMERUS FRACTURE: SHX2126

## 2014-07-05 HISTORY — PX: ORIF PROXIMAL HUMERUS FRACTURE: SUR951

## 2014-07-05 HISTORY — DX: Acute myocardial infarction, unspecified: I21.9

## 2014-07-05 SURGERY — OPEN REDUCTION INTERNAL FIXATION (ORIF) PROXIMAL HUMERUS FRACTURE
Anesthesia: General | Site: Shoulder | Laterality: Right

## 2014-07-05 MED ORDER — ACETAMINOPHEN 325 MG PO TABS: 650.0000 mg | ORAL_TABLET | Freq: Four times a day (QID) | ORAL | Status: DC | PRN

## 2014-07-05 MED ORDER — FENTANYL CITRATE (PF) 100 MCG/2ML IJ SOLN
INTRAMUSCULAR | Status: AC
  Filled 2014-07-05: qty 2

## 2014-07-05 MED ORDER — MORPHINE SULFATE 2 MG/ML IJ SOLN
2.0000 mg | INTRAMUSCULAR | Status: DC | PRN
  Administered 2014-07-05 – 2014-07-06 (×5): 2 mg via INTRAVENOUS
  Filled 2014-07-05 (×5): qty 1

## 2014-07-05 MED ORDER — ACETAMINOPHEN 500 MG PO TABS
1000.0000 mg | ORAL_TABLET | Freq: Four times a day (QID) | ORAL | Status: AC
  Administered 2014-07-05 – 2014-07-06 (×4): 1000 mg via ORAL
  Filled 2014-07-05 (×4): qty 2

## 2014-07-05 MED ORDER — NEOSTIGMINE METHYLSULFATE 10 MG/10ML IV SOLN
INTRAVENOUS | Status: AC
  Filled 2014-07-05: qty 1

## 2014-07-05 MED ORDER — DIPHENHYDRAMINE HCL 12.5 MG/5ML PO ELIX: 12.5000 mg | ORAL_SOLUTION | ORAL | Status: DC | PRN

## 2014-07-05 MED ORDER — FLEET ENEMA 7-19 GM/118ML RE ENEM: 1.0000 | ENEMA | Freq: Once | RECTAL | Status: AC | PRN

## 2014-07-05 MED ORDER — PHENYLEPHRINE HCL 10 MG/ML IJ SOLN
INTRAMUSCULAR | Status: DC | PRN
  Administered 2014-07-05 (×10): 40 ug via INTRAVENOUS

## 2014-07-05 MED ORDER — DEXAMETHASONE SODIUM PHOSPHATE 4 MG/ML IJ SOLN
INTRAMUSCULAR | Status: DC | PRN
  Administered 2014-07-05: 8 mg via INTRAVENOUS

## 2014-07-05 MED ORDER — SODIUM CHLORIDE 0.9 % IV SOLN: 10.0000 mg | INTRAVENOUS | Status: DC | PRN

## 2014-07-05 MED ORDER — NEOSTIGMINE METHYLSULFATE 10 MG/10ML IV SOLN
INTRAVENOUS | Status: DC | PRN
  Administered 2014-07-05: 5 mg via INTRAVENOUS

## 2014-07-05 MED ORDER — MENTHOL 3 MG MT LOZG: 1.0000 | LOZENGE | OROMUCOSAL | Status: DC | PRN

## 2014-07-05 MED ORDER — ROCURONIUM BROMIDE 50 MG/5ML IV SOLN
INTRAVENOUS | Status: AC
  Filled 2014-07-05: qty 1

## 2014-07-05 MED ORDER — METOCLOPRAMIDE HCL 5 MG/ML IJ SOLN: 5.0000 mg | Freq: Three times a day (TID) | INTRAMUSCULAR | Status: DC | PRN

## 2014-07-05 MED ORDER — DEXAMETHASONE SODIUM PHOSPHATE 4 MG/ML IJ SOLN
INTRAMUSCULAR | Status: AC
  Filled 2014-07-05: qty 2

## 2014-07-05 MED ORDER — ALUMINUM HYDROXIDE GEL 320 MG/5ML PO SUSP
15.0000 mL | ORAL | Status: DC | PRN
  Filled 2014-07-05: qty 30

## 2014-07-05 MED ORDER — SODIUM CHLORIDE 0.9 % IV SOLN
INTRAVENOUS | Status: DC
  Administered 2014-07-05 (×2): via INTRAVENOUS

## 2014-07-05 MED ORDER — MIDAZOLAM HCL 2 MG/2ML IJ SOLN
INTRAMUSCULAR | Status: AC
  Filled 2014-07-05: qty 2

## 2014-07-05 MED ORDER — ONDANSETRON HCL 4 MG/2ML IJ SOLN
INTRAMUSCULAR | Status: DC | PRN
  Administered 2014-07-05: 4 mg via INTRAVENOUS

## 2014-07-05 MED ORDER — ACETAMINOPHEN 650 MG RE SUPP: 650.0000 mg | Freq: Four times a day (QID) | RECTAL | Status: DC | PRN

## 2014-07-05 MED ORDER — BISACODYL 5 MG PO TBEC: 5.0000 mg | DELAYED_RELEASE_TABLET | Freq: Every day | ORAL | Status: DC | PRN

## 2014-07-05 MED ORDER — METHOCARBAMOL 500 MG PO TABS
500.0000 mg | ORAL_TABLET | Freq: Four times a day (QID) | ORAL | Status: DC | PRN
  Administered 2014-07-05 – 2014-07-06 (×2): 500 mg via ORAL
  Filled 2014-07-05 (×2): qty 1

## 2014-07-05 MED ORDER — LIDOCAINE HCL (CARDIAC) 20 MG/ML IV SOLN
INTRAVENOUS | Status: DC | PRN
  Administered 2014-07-05: 50 mg via INTRAVENOUS

## 2014-07-05 MED ORDER — FENTANYL CITRATE (PF) 250 MCG/5ML IJ SOLN
INTRAMUSCULAR | Status: AC
  Filled 2014-07-05: qty 5

## 2014-07-05 MED ORDER — FENTANYL CITRATE (PF) 100 MCG/2ML IJ SOLN
INTRAMUSCULAR | Status: DC | PRN
  Administered 2014-07-05 (×2): 50 ug via INTRAVENOUS
  Administered 2014-07-05: 100 ug via INTRAVENOUS

## 2014-07-05 MED ORDER — DOCUSATE SODIUM 100 MG PO CAPS
100.0000 mg | ORAL_CAPSULE | Freq: Two times a day (BID) | ORAL | Status: DC
  Administered 2014-07-05 – 2014-07-06 (×3): 100 mg via ORAL
  Filled 2014-07-05 (×3): qty 1

## 2014-07-05 MED ORDER — PHENOL 1.4 % MT LIQD: 1.0000 | OROMUCOSAL | Status: DC | PRN

## 2014-07-05 MED ORDER — ZOLPIDEM TARTRATE 5 MG PO TABS: 5.0000 mg | ORAL_TABLET | Freq: Every evening | ORAL | Status: DC | PRN

## 2014-07-05 MED ORDER — GLYCOPYRROLATE 0.2 MG/ML IJ SOLN
INTRAMUSCULAR | Status: DC | PRN
  Administered 2014-07-05: 0.6 mg via INTRAVENOUS

## 2014-07-05 MED ORDER — 0.9 % SODIUM CHLORIDE (POUR BTL) OPTIME
TOPICAL | Status: DC | PRN
  Administered 2014-07-05: 1000 mL

## 2014-07-05 MED ORDER — PROPOFOL 10 MG/ML IV BOLUS
INTRAVENOUS | Status: DC | PRN
  Administered 2014-07-05: 160 mg via INTRAVENOUS

## 2014-07-05 MED ORDER — PROPOFOL 10 MG/ML IV BOLUS
INTRAVENOUS | Status: AC
  Filled 2014-07-05: qty 20

## 2014-07-05 MED ORDER — POLYETHYLENE GLYCOL 3350 17 G PO PACK: 17.0000 g | PACK | Freq: Every day | ORAL | Status: DC | PRN

## 2014-07-05 MED ORDER — METHOCARBAMOL 1000 MG/10ML IJ SOLN
500.0000 mg | Freq: Four times a day (QID) | INTRAVENOUS | Status: DC | PRN
  Filled 2014-07-05: qty 5

## 2014-07-05 MED ORDER — MIDAZOLAM HCL 5 MG/5ML IJ SOLN
INTRAMUSCULAR | Status: DC | PRN
  Administered 2014-07-05: 2 mg via INTRAVENOUS

## 2014-07-05 MED ORDER — METHOCARBAMOL 1000 MG/10ML IJ SOLN
500.0000 mg | INTRAMUSCULAR | Status: AC
  Administered 2014-07-05: 500 mg via INTRAVENOUS
  Filled 2014-07-05: qty 5

## 2014-07-05 MED ORDER — POVIDONE-IODINE 7.5 % EX SOLN
Freq: Once | CUTANEOUS | Status: DC
  Filled 2014-07-05: qty 118

## 2014-07-05 MED ORDER — PHENYLEPHRINE HCL 10 MG/ML IJ SOLN
INTRAMUSCULAR | Status: AC
  Filled 2014-07-05: qty 1

## 2014-07-05 MED ORDER — ONDANSETRON HCL 4 MG/2ML IJ SOLN: 4.0000 mg | Freq: Four times a day (QID) | INTRAMUSCULAR | Status: DC | PRN

## 2014-07-05 MED ORDER — LACTATED RINGERS IV SOLN
INTRAVENOUS | Status: DC | PRN
  Administered 2014-07-05: 07:00:00 via INTRAVENOUS

## 2014-07-05 MED ORDER — ONDANSETRON HCL 4 MG/2ML IJ SOLN
INTRAMUSCULAR | Status: AC
  Filled 2014-07-05: qty 2

## 2014-07-05 MED ORDER — FENTANYL CITRATE (PF) 100 MCG/2ML IJ SOLN
25.0000 ug | INTRAMUSCULAR | Status: DC | PRN
  Administered 2014-07-05: 50 ug via INTRAVENOUS

## 2014-07-05 MED ORDER — OXYCODONE HCL 5 MG PO TABS
5.0000 mg | ORAL_TABLET | ORAL | Status: DC | PRN
  Administered 2014-07-05 – 2014-07-06 (×7): 10 mg via ORAL
  Filled 2014-07-05 (×6): qty 2

## 2014-07-05 MED ORDER — ROCURONIUM BROMIDE 100 MG/10ML IV SOLN
INTRAVENOUS | Status: DC | PRN
  Administered 2014-07-05: 50 mg via INTRAVENOUS

## 2014-07-05 MED ORDER — FLUTICASONE PROPIONATE 50 MCG/ACT NA SUSP
2.0000 | Freq: Every day | NASAL | Status: DC
  Filled 2014-07-05 (×2): qty 16

## 2014-07-05 MED ORDER — ONDANSETRON HCL 4 MG PO TABS: 4.0000 mg | ORAL_TABLET | Freq: Four times a day (QID) | ORAL | Status: DC | PRN

## 2014-07-05 MED ORDER — CEFAZOLIN SODIUM 1-5 GM-% IV SOLN
1.0000 g | Freq: Four times a day (QID) | INTRAVENOUS | Status: AC
  Administered 2014-07-05 (×3): 1 g via INTRAVENOUS
  Filled 2014-07-05 (×3): qty 50

## 2014-07-05 MED ORDER — METOCLOPRAMIDE HCL 5 MG PO TABS: 5.0000 mg | ORAL_TABLET | Freq: Three times a day (TID) | ORAL | Status: DC | PRN

## 2014-07-05 MED ORDER — BUPROPION HCL ER (XL) 150 MG PO TB24
150.0000 mg | ORAL_TABLET | Freq: Every day | ORAL | Status: DC
Start: 2014-07-05 — End: 2014-07-06
  Administered 2014-07-06: 150 mg via ORAL
  Filled 2014-07-05 (×2): qty 1

## 2014-07-05 MED ORDER — ONDANSETRON HCL 4 MG/2ML IJ SOLN: 4.0000 mg | Freq: Once | INTRAMUSCULAR | Status: DC | PRN

## 2014-07-05 MED ORDER — ASPIRIN EC 325 MG PO TBEC
325.0000 mg | DELAYED_RELEASE_TABLET | Freq: Two times a day (BID) | ORAL | Status: DC
  Administered 2014-07-05 – 2014-07-06 (×3): 325 mg via ORAL
  Filled 2014-07-05 (×3): qty 1

## 2014-07-05 MED ORDER — GLYCOPYRROLATE 0.2 MG/ML IJ SOLN
INTRAMUSCULAR | Status: AC
  Filled 2014-07-05: qty 3

## 2014-07-05 MED ORDER — PHENYLEPHRINE HCL 10 MG/ML IJ SOLN
10.0000 mg | INTRAMUSCULAR | Status: DC | PRN
  Administered 2014-07-05: 5 ug/min via INTRAVENOUS

## 2014-07-05 MED ORDER — OXYCODONE HCL 5 MG/5ML PO SOLN
ORAL | Status: AC
Start: 2014-07-05 — End: 2014-07-05
  Filled 2014-07-05: qty 10

## 2014-07-05 SURGICAL SUPPLY — 70 items
APL SKNCLS STERI-STRIP NONHPOA (GAUZE/BANDAGES/DRESSINGS) ×1
BENZOIN TINCTURE PRP APPL 2/3 (GAUZE/BANDAGES/DRESSINGS) ×1 IMPLANT
BIT DRILL 3.2 (BIT) ×4
BIT DRILL 3.2XCALB NS DISP (BIT) IMPLANT
BIT DRILL CALIBRATED 2.7 (BIT) ×1 IMPLANT
BIT DRL 3.2XCALB NS DISP (BIT) ×2
CHLORAPREP W/TINT 26ML (MISCELLANEOUS) ×2 IMPLANT
COVER SURGICAL LIGHT HANDLE (MISCELLANEOUS) ×4 IMPLANT
DRAPE C-ARM 42X72 X-RAY (DRAPES) ×2 IMPLANT
DRAPE IMP U-DRAPE 54X76 (DRAPES) ×2 IMPLANT
DRAPE INCISE IOBAN 66X45 STRL (DRAPES) ×2 IMPLANT
DRAPE PROXIMA HALF (DRAPES) ×2 IMPLANT
DRAPE SURG 17X23 STRL (DRAPES) ×2 IMPLANT
DRAPE U-SHAPE 47X51 STRL (DRAPES) ×2 IMPLANT
DRSG AQUACEL AG ADV 3.5X10 (GAUZE/BANDAGES/DRESSINGS) ×1 IMPLANT
DRSG EMULSION OIL 3X3 NADH (GAUZE/BANDAGES/DRESSINGS) ×2 IMPLANT
DRSG MEPILEX BORDER 4X8 (GAUZE/BANDAGES/DRESSINGS) IMPLANT
DRSG PAD ABDOMINAL 8X10 ST (GAUZE/BANDAGES/DRESSINGS) ×2 IMPLANT
ELECT REM PT RETURN 9FT ADLT (ELECTROSURGICAL) ×2
ELECTRODE REM PT RTRN 9FT ADLT (ELECTROSURGICAL) ×1 IMPLANT
GAUZE SPONGE 4X4 12PLY STRL (GAUZE/BANDAGES/DRESSINGS) IMPLANT
GLOVE BIO SURGEON STRL SZ7 (GLOVE) ×2 IMPLANT
GLOVE BIO SURGEON STRL SZ7.5 (GLOVE) ×4 IMPLANT
GLOVE BIOGEL PI IND STRL 7.0 (GLOVE) ×1 IMPLANT
GLOVE BIOGEL PI IND STRL 8 (GLOVE) ×1 IMPLANT
GLOVE BIOGEL PI INDICATOR 7.0 (GLOVE) ×1
GLOVE BIOGEL PI INDICATOR 8 (GLOVE) ×1
GOWN STRL REUS W/ TWL LRG LVL3 (GOWN DISPOSABLE) ×3 IMPLANT
GOWN STRL REUS W/ TWL XL LVL3 (GOWN DISPOSABLE) ×1 IMPLANT
GOWN STRL REUS W/TWL LRG LVL3 (GOWN DISPOSABLE) ×6
GOWN STRL REUS W/TWL XL LVL3 (GOWN DISPOSABLE) ×2
K-WIRE 2X5 SS THRDED S3 (WIRE) ×4
KIT BASIN OR (CUSTOM PROCEDURE TRAY) ×2 IMPLANT
KIT ROOM TURNOVER OR (KITS) ×2 IMPLANT
KWIRE 2X5 SS THRDED S3 (WIRE) IMPLANT
MANIFOLD NEPTUNE II (INSTRUMENTS) ×2 IMPLANT
NDL SUT .5 MAYO 1.404X.05X (NEEDLE) ×1 IMPLANT
NDL SUT 2 .5 CRC MAYO 1.732X (NEEDLE) ×1 IMPLANT
NEEDLE 22X1 1/2 (OR ONLY) (NEEDLE) ×1 IMPLANT
NEEDLE MAYO TAPER (NEEDLE) ×4
NS IRRIG 1000ML POUR BTL (IV SOLUTION) ×2 IMPLANT
PACK SHOULDER (CUSTOM PROCEDURE TRAY) ×2 IMPLANT
PACK UNIVERSAL I (CUSTOM PROCEDURE TRAY) ×2 IMPLANT
PAD ARMBOARD 7.5X6 YLW CONV (MISCELLANEOUS) ×4 IMPLANT
PEG LOCKING 3.2X34 (Screw) ×1 IMPLANT
PEG LOCKING 3.2X36 (Screw) ×1 IMPLANT
PEG LOCKING 3.2X38 (Screw) ×1 IMPLANT
PEG LOCKING 3.2X40 (Peg) ×1 IMPLANT
PEG LOCKING 3.2X42 (Screw) ×1 IMPLANT
PEG LOCKING 3.2X48 (Peg) ×1 IMPLANT
PLATE 3HOLE HUMERUS PROX RT (Plate) ×1 IMPLANT
SCREW LP NL T15 3.5X22 (Screw) ×1 IMPLANT
SCREW LP NL T15 3.5X24 (Screw) ×2 IMPLANT
SLEEVE MEASURING 3.2 (BIT) ×1 IMPLANT
SPONGE LAP 18X18 X RAY DECT (DISPOSABLE) ×4 IMPLANT
STAPLER VISISTAT 35W (STAPLE) ×2 IMPLANT
STRIP CLOSURE SKIN 1/2X4 (GAUZE/BANDAGES/DRESSINGS) ×2 IMPLANT
SUCTION FRAZIER TIP 10 FR DISP (SUCTIONS) ×2 IMPLANT
SUPPORT WRAP ARM LG (MISCELLANEOUS) ×2 IMPLANT
SUT BONE WAX W31G (SUTURE) IMPLANT
SUT FIBERWIRE #2 38 T-5 BLUE (SUTURE) ×4
SUT MNCRL AB 4-0 PS2 18 (SUTURE) ×1 IMPLANT
SUT VIC AB 2-0 CT1 27 (SUTURE) ×4
SUT VIC AB 2-0 CT1 TAPERPNT 27 (SUTURE) ×2 IMPLANT
SUTURE FIBERWR #2 38 T-5 BLUE (SUTURE) ×2 IMPLANT
SYR CONTROL 10ML LL (SYRINGE) ×1 IMPLANT
TOWEL OR 17X24 6PK STRL BLUE (TOWEL DISPOSABLE) ×2 IMPLANT
TOWEL OR 17X26 10 PK STRL BLUE (TOWEL DISPOSABLE) ×2 IMPLANT
WATER STERILE IRR 1000ML POUR (IV SOLUTION) ×2 IMPLANT
YANKAUER SUCT BULB TIP NO VENT (SUCTIONS) ×2 IMPLANT

## 2014-07-05 NOTE — Anesthesia Postprocedure Evaluation (Signed)
  Anesthesia Post-op Note  Patient: Nicole Calhoun  Procedure(s) Performed: Procedure(s) with comments: OPEN REDUCTION INTERNAL FIXATION (ORIF) PROXIMAL HUMERUS FRACTURE (Right) - Open reduction internal fixation right shoulder   Patient Location: PACU  Anesthesia Type:General and GA combined with regional for post-op pain  Level of Consciousness: awake, alert  and oriented  Airway and Oxygen Therapy: Patient Spontanous Breathing and Patient connected to nasal cannula oxygen  Post-op Pain: mild  Post-op Assessment: Post-op Vital signs reviewed, Patient's Cardiovascular Status Stable, Respiratory Function Stable, Patent Airway and Pain level controlled              Post-op Vital Signs: stable  Last Vitals:  Filed Vitals:   07/05/14 1043  BP: 136/70  Pulse: 88  Temp: 36.4 C  Resp: 18    Complications: No apparent anesthesia complications

## 2014-07-05 NOTE — Transfer of Care (Signed)
Immediate Anesthesia Transfer of Care Note  Patient: Nicole Calhoun  Procedure(s) Performed: Procedure(s) with comments: OPEN REDUCTION INTERNAL FIXATION (ORIF) PROXIMAL HUMERUS FRACTURE (Right) - Open reduction internal fixation right shoulder   Patient Location: PACU  Anesthesia Type:GA combined with regional for post-op pain  Level of Consciousness: awake, alert , oriented, patient cooperative and responds to stimulation  Airway & Oxygen Therapy: Patient Spontanous Breathing and Patient connected to nasal cannula oxygen  Post-op Assessment: Report given to RN, Post -op Vital signs reviewed and stable, Patient moving all extremities X 4 and Patient able to stick tongue midline  Post vital signs: stable  Last Vitals:  Filed Vitals:   07/05/14 0600  BP: 127/77  Pulse: 86  Temp: 36.5 C  Resp: 18    Complications: No apparent anesthesia complications

## 2014-07-05 NOTE — Care Management (Signed)
Utilization review completed by Aariana Shankland N. Missie Gehrig, RN BSN 

## 2014-07-05 NOTE — Discharge Instructions (Signed)
Discharge Instructions after Open Shoulder Repair  A sling has been provided for you. Remain in your sling at all times. This includes sleeping in your sling.  Use ice on the shoulder intermittently over the first 48 hours after surgery.  Pain medicine has been prescribed for you.  Use your medicine liberally over the first 48 hours, and then you can begin to taper your use. You may take Extra Strength Tylenol or Tylenol only in place of the pain pills. DO NOT take ANY nonsteroidal anti-inflammatory pain medications: Advil, Motrin, Ibuprofen, Aleve, Naproxen or Naprosyn.  Do not remove your dressing until your first post operative visit with Dr. Tamera Punt.  The dressing is water proof, simply allow the water to wash over the dressing and then pat dry.  Make sure your axilla (armpit) is completely dry after showering.  Take one aspirin, a day for 2 weeks after surgery, unless you have an aspirin sensitivity/ allergy or asthma.   Please call [phone] during normal business hours or [phone] after hours for any problems. Including the following:  - excessive redness of the incisions - drainage for more than 4 days - fever of more than 101.5 F  *Please note that pain medications will not be refilled after hours or on weekends.

## 2014-07-05 NOTE — H&P (Signed)
Nicole Calhoun is an 59 y.o. female.   Chief Complaint: R shoulder injury HPI: s/p fall with R displaced proximal humerus fracture.  Past Medical History  Diagnosis Date  . Hepatitis C   . Depression   . Pancreatic cyst   . Coronary artery disease     Mild  . Hard of hearing   . Arthritis   . PONV (postoperative nausea and vomiting)   . Dysrhythmia   . Sleep apnea   . GERD (gastroesophageal reflux disease)   . History of hiatal hernia   . Fibromyalgia     Past Surgical History  Procedure Laterality Date  . Foot surgery Left 04/28/2012    EXCISION OF PLANTAR FIBROMA , PLANTAR ASPECT LEFT ARCH  . Cesarean section      x3.  . Cardiovascular stress test  06/08/2009    No scintigraphic evidence of inducible myocardial ischemia.  . Transthoracic echocardiogram  06/08/2009    EF >55%, normal LV systolic function  . Right foot surgery    . Eye surgery    . Cataract extraction Bilateral     Family History  Problem Relation Age of Onset  . Arthritis Mother   . Heart disease Mother 29  . Stroke Mother 70  . Cancer Father     Colon cancer  . Stroke Father 38   Social History:  reports that she has quit smoking. She has never used smokeless tobacco. She reports that she does not drink alcohol or use illicit drugs.  Allergies:  Allergies  Allergen Reactions  . Codeine Nausea Only    Medications Prior to Admission  Medication Sig Dispense Refill  . buPROPion (WELLBUTRIN XL) 150 MG 24 hr tablet TAKE 1 TABLET BY MOUTH EVERY DAY 30 tablet 5  . Docusate Calcium (STOOL SOFTENER PO) Take 1 tablet by mouth daily as needed (constipation).    . fluticasone (FLONASE) 50 MCG/ACT nasal spray Place 2 sprays into the nose daily. 16 g 6  . ibandronate (BONIVA) 150 MG tablet Take 150 mg by mouth every 30 (thirty) days. Take in the morning with a full glass of water, on an empty stomach, and do not take anything else by mouth or lie down for the next 30 min.    Marland Kitchen ibuprofen (ADVIL,MOTRIN)  600 MG tablet Take 600 mg by mouth every 6 (six) hours as needed (pain).     . ondansetron (ZOFRAN) 4 MG tablet Take 1 tablet (4 mg total) by mouth every 6 (six) hours. 12 tablet 0  . oxyCODONE-acetaminophen (PERCOCET/ROXICET) 5-325 MG per tablet Take 2 tablets by mouth every 4 (four) hours as needed for severe pain. 14 tablet 0    Results for orders placed or performed during the hospital encounter of 07/04/14 (from the past 48 hour(s))  Surgical pcr screen     Status: None   Collection Time: 07/04/14  8:55 AM  Result Value Ref Range   MRSA, PCR NEGATIVE NEGATIVE   Staphylococcus aureus NEGATIVE NEGATIVE    Comment:        The Xpert SA Assay (FDA approved for NASAL specimens in patients over 18 years of age), is one component of a comprehensive surveillance program.  Test performance has been validated by [facility] for patients greater than or equal to 12 year old. It is not intended to diagnose infection nor to guide or monitor treatment.   Comprehensive metabolic panel     Status: Abnormal   Collection Time: 07/04/14  8:55 AM  Result Value Ref Range   Sodium 141 135 - 145 mmol/L   Potassium 4.2 3.5 - 5.1 mmol/L   Chloride 104 101 - 111 mmol/L   CO2 28 22 - 32 mmol/L   Glucose, Bld 108 (H) 65 - 99 mg/dL   BUN 12 6 - 20 mg/dL   Creatinine, Ser 0.62 0.44 - 1.00 mg/dL   Calcium 9.3 8.9 - 10.3 mg/dL   Total Protein 6.8 6.5 - 8.1 g/dL   Albumin 3.4 (L) 3.5 - 5.0 g/dL   AST 24 15 - 41 U/L   ALT 14 14 - 54 U/L   Alkaline Phosphatase 79 38 - 126 U/L   Total Bilirubin 1.4 (H) 0.3 - 1.2 mg/dL   GFR calc non Af Amer >60 >60 mL/min   GFR calc Af Amer >60 >60 mL/min    Comment: (NOTE) The eGFR has been calculated using the CKD EPI equation. This calculation has not been validated in all clinical situations. eGFR's persistently <60 mL/min signify possible Chronic Kidney Disease.    Anion gap 9 5 - 15  CBC     Status: None   Collection Time: 07/04/14  8:55 AM  Result Value  Ref Range   WBC 5.0 4.0 - 10.5 K/uL   RBC 4.05 3.87 - 5.11 MIL/uL   Hemoglobin 12.5 12.0 - 15.0 g/dL   HCT 38.1 36.0 - 46.0 %   MCV 94.1 78.0 - 100.0 fL   MCH 30.9 26.0 - 34.0 pg   MCHC 32.8 30.0 - 36.0 g/dL   RDW 13.2 11.5 - 15.5 %   Platelets 270 150 - 400 K/uL   No results found.  Review of Systems  All other systems reviewed and are negative.   Blood pressure 127/77, pulse 86, temperature 97.7 F (36.5 C), temperature source Oral, resp. rate 18, SpO2 100 %. Physical Exam  Constitutional: She is oriented to person, place, and time. She appears well-developed and well-nourished.  HENT:  Head: Atraumatic.  Eyes: EOM are normal.  Cardiovascular: Intact distal pulses.   Respiratory: Effort normal.  Musculoskeletal:  R shoulder mild ecchymosis and swelling. NVID  Neurological: She is alert and oriented to person, place, and time.  Skin: Skin is warm and dry.  Psychiatric: She has a normal mood and affect.     Assessment/Plan R displaced proximal humerus fracture Plan ORIF Risks / benefits of surgery discussed Consent on chart  NPO for OR Preop antibiotics   Treveon Bourcier WILLIAM 07/05/2014, 7:09 AM

## 2014-07-05 NOTE — Anesthesia Procedure Notes (Addendum)
Anesthesia Regional Block:  Interscalene brachial plexus block  Pre-Anesthetic Checklist: ,, timeout performed, Correct Patient, Correct Site, Correct Laterality, Correct Procedure, Correct Position, site marked, Risks and benefits discussed,  Surgical consent,  Pre-op evaluation,  At surgeon's request and post-op pain management  Laterality: Left  Prep: chloraprep       Needles:  Injection technique: Single-shot  Needle Type: Echogenic Stimulator Needle     Needle Length: 9cm 9 cm Needle Gauge: 22 and 22 G    Additional Needles:  Procedures: ultrasound guided (picture in chart) Interscalene brachial plexus block Narrative:  Start time: 07/05/2014 7:10 AM End time: 07/05/2014 7:15 AM Injection made incrementally with aspirations every 5 mL.  Performed by: Personally   Additional Notes: 30 cc 0.5% bupivacaine with 1:200 epi injected easily   Procedure Name: Intubation Date/Time: 07/05/2014 7:43 AM Performed by: Vennie Homans Pre-anesthesia Checklist: Patient identified, Timeout performed, Emergency Drugs available, Suction available and Patient being monitored Patient Re-evaluated:Patient Re-evaluated prior to inductionOxygen Delivery Method: Circle system utilized Preoxygenation: Pre-oxygenation with 100% oxygen Intubation Type: IV induction Ventilation: Mask ventilation without difficulty Laryngoscope Size: Mac and 3 Grade View: Grade I Tube type: Oral Tube size: 7.0 mm Number of attempts: 1 Airway Equipment and Method: Stylet Placement Confirmation: ETT inserted through vocal cords under direct vision,  breath sounds checked- equal and bilateral and positive ETCO2 Secured at: 21 cm Tube secured with: Tape Dental Injury: Teeth and Oropharynx as per pre-operative assessment

## 2014-07-05 NOTE — Op Note (Signed)
Procedure(s): OPEN REDUCTION INTERNAL FIXATION (ORIF) PROXIMAL HUMERUS FRACTURE Procedure Note  Nicole Calhoun female 59 y.o. 07/05/2014  Procedure(s) and Anesthesia Type:    * OPEN REDUCTION INTERNAL FIXATION (ORIF) RIGHT PROXIMAL HUMERUS FRACTURE - Choice  Surgeon(s) and Role:    * Tania Ade, MD - Primary   Indications:  59 y.o. female s/p fall with right displaced proximal humerus fracture. Indicated for surgery to promote anatomic restoration anatomy, improve functional outcomen.     Surgeon: Nita Sells   Assistants: Jeanmarie Hubert PA-C [facility] was present and scrubbed throughout the procedure and was essential in positioning, retraction, exposure, and closure)  Anesthesia: General endotracheal anesthesia with preoperative interscalene block given by the attending anesthesiologist       Procedure Detail  OPEN REDUCTION INTERNAL FIXATION (ORIF) PROXIMAL HUMERUS FRACTURE  Findings: Near-anatomic reduction with a Biomet locking plate with suture augmentation   Estimated Blood Loss:  300 mL         Drains: none  Blood Given: none         Specimens: none        Complications:  * No complications entered in OR log *         Disposition: PACU - hemodynamically stable.         Condition: stable    Procedure:   DESCRIPTION OF PROCEDURE: The patient was identified in preoperative  holding area where I personally marked the operative site after  verifying site, side, and procedure with the patient. The patient was taken back  to the operating room where general anesthesia was induced without  complication and was placed in the beach-chair position with the back  elevated about 40 degrees and all extremities carefully padded and  positioned.  The right upper extremity was then prepped and  draped in a standard sterile fashion. The appropriate time-out  procedure was carried out. The patient did receive IV antibiotics  within 30 minutes of  incision.  An incision was  made from the coracoid tip to the center point in the humerus at the level of the axilla. Dissection was carried down through subcutaneous tissues to the level of the cephalic vein identifying the deltopectoral interval. The deltoid was taken laterally while the pectoralis was taken medially. The underlying conjoined tendon was identified and a retractor was placed beneath it. The biceps tendon was identified and traced into the rotator interval which was opened. The biceps was tenotomized from the superior labrum and subsequently tenodesis to the upper border of the pectoralis major. The fracture was then identified and carefully exposed using a small Cobb elevator. A reduction clamp was used to carefully reduce the shaft laterally under the head. Rotation was aligned using the bicipital groove. A suture was placed in the subscapularis and supraspinatus to allow assistance with reduction and fixation. Once except alignment was achieved 3 hole plate was laid laterally and fixed with a shaft screw in the oblong hole. Appropriate height was verified with fluoroscopic imaging. Proximal locking pegs were then placed. Fluoroscopic imaging was used to verify reduction and peg length. A distal shaft screws were then placed. After fluoroscopic imaging demonstrated appropriate reduction and hardware placement the sutures in the rotator cuff raised augment the repair and a figure-of-eight suture was placed in the rotator interval as well. At this point the wound was copiously irrigated with normal saline subsequent to closed in layers with 2-0 vicryl and 4-0 Monocryl. Steri-Strips and a light dressing were applied. The patient was placed in  a sling and allowed to awaken from anesthesia. She is transferred to stretcher and taken to recovery room in stable condition.   POSTOPERATIVE: She willl be kept overnight for pain control and  antibiotics, and will likely be discharged in the morning in a  sling.

## 2014-07-05 NOTE — Anesthesia Preprocedure Evaluation (Signed)
Anesthesia Evaluation  Patient identified by MRN, date of birth, ID band Patient awake    Reviewed: Allergy & Precautions, NPO status , Patient's Chart, lab work & pertinent test results  History of Anesthesia Complications (+) history of anesthetic complications  Airway Mallampati: II  TM Distance: >3 FB Neck ROM: Full    Dental  (+) Teeth Intact, Dental Advisory Given   Pulmonary former smoker,  breath sounds clear to auscultation        Cardiovascular Rhythm:Regular Rate:Normal     Neuro/Psych    GI/Hepatic   Endo/Other    Renal/GU      Musculoskeletal   Abdominal   Peds  Hematology   Anesthesia Other Findings   Reproductive/Obstetrics                             Anesthesia Physical Anesthesia Plan  ASA: II  Anesthesia Plan: General   Post-op Pain Management:    Induction: Intravenous  Airway Management Planned: Oral ETT  Additional Equipment:   Intra-op Plan:   Post-operative Plan:   Informed Consent: I have reviewed the patients History and Physical, chart, labs and discussed the procedure including the risks, benefits and alternatives for the proposed anesthesia with the patient or authorized representative who has indicated his/her understanding and acceptance.   Dental advisory given  Plan Discussed with: CRNA and Anesthesiologist  Anesthesia Plan Comments: (Plan GA with oral ETT and interscalene block   Roberts Gaudy)        Anesthesia Quick Evaluation

## 2014-07-06 ENCOUNTER — Encounter (HOSPITAL_COMMUNITY): Payer: Self-pay | Admitting: Orthopedic Surgery

## 2014-07-06 DIAGNOSIS — S42291A Other displaced fracture of upper end of right humerus, initial encounter for closed fracture: Secondary | ICD-10-CM | POA: Diagnosis not present

## 2014-07-06 LAB — CBC
HCT: 31.9 % — ABNORMAL LOW (ref 36.0–46.0)
Hemoglobin: 10.4 g/dL — ABNORMAL LOW (ref 12.0–15.0)
MCH: 31 pg (ref 26.0–34.0)
MCHC: 32.6 g/dL (ref 30.0–36.0)
MCV: 94.9 fL (ref 78.0–100.0)
Platelets: 257 10*3/uL (ref 150–400)
RBC: 3.36 MIL/uL — ABNORMAL LOW (ref 3.87–5.11)
RDW: 13.4 % (ref 11.5–15.5)
WBC: 9.4 10*3/uL (ref 4.0–10.5)

## 2014-07-06 MED ORDER — OXYCODONE-ACETAMINOPHEN 5-325 MG PO TABS: 1.0000 | ORAL_TABLET | ORAL | Status: DC | PRN

## 2014-07-06 MED ORDER — DOCUSATE SODIUM 100 MG PO CAPS: 100.0000 mg | ORAL_CAPSULE | Freq: Three times a day (TID) | ORAL | Status: DC | PRN

## 2014-07-06 NOTE — Progress Notes (Signed)
   PATIENT ID: Nicole Calhoun   1 Day Post-Op Procedure(s) (LRB): OPEN REDUCTION INTERNAL FIXATION (ORIF) PROXIMAL HUMERUS FRACTURE (Right)  Subjective: Doing well this am. Had some pain overnight but improved this morning. Up eating breakfast, no BM recently.  Objective:  Filed Vitals:   07/06/14 0508  BP: 134/68  Pulse: 88  Temp: 97.8 F (36.6 C)  Resp: 18     R UE dressing c/d/i Sling Wiggles fingers, distally NVI  Labs:   Recent Labs  07/04/14 0855 07/06/14 0549  HGB 12.5 10.4*   Recent Labs  07/04/14 0855 07/06/14 0549  WBC 5.0 9.4  RBC 4.05 3.36*  HCT 38.1 31.9*  PLT 270 257   Recent Labs  07/04/14 0855  NA 141  K 4.2  CL 104  CO2 28  BUN 12  CREATININE 0.62  GLUCOSE 108*  CALCIUM 9.3    Assessment and Plan: 1 day s/p right ORIF prox humerus fracture Sling D/c today once pain under control, BM Follow up with Dr. Tamera Punt in 2 weeks Percocet for home pain control  VTE proph: ASA 325mg  BID, SCDs

## 2014-07-06 NOTE — Discharge Summary (Signed)
Patient ID: Nicole Calhoun MRN: [id number] DOB/AGE: 09-30-1955 59 y.o.  Admit date: 07/05/2014 Discharge date: 07/06/2014  Admission Diagnoses:  Active Problems:   Proximal humerus fracture   Discharge Diagnoses:  Same  Past Medical History  Diagnosis Date  . Depression   . Pancreatic cyst   . Coronary artery disease     Mild  . Hard of hearing   . PONV (postoperative nausea and vomiting)   . Dysrhythmia   . GERD (gastroesophageal reflux disease)   . History of hiatal hernia   . Fibromyalgia   . Myocardial infarction     "I was told I'd had a light heart attack one time; maybe 2001"  . Sleep apnea     "have mask; haven't worn it in years" (07/05/2014)  . Hepatitis C     "I took the treatment and was cleared" (07/05/2014)  . Arthritis     "all over" (07/04/2104)  . Chronic lower back pain     Surgeries: Procedure(s): OPEN REDUCTION INTERNAL FIXATION (ORIF) PROXIMAL HUMERUS FRACTURE on 07/05/2014   Consultants:    Discharged Condition: Improved  Hospital Course: Nicole Calhoun is an 59 y.o. female who was admitted 07/05/2014 for operative treatment of right proximal humerus fracture s/p fall. Indicated for surgery to promote anatomic restoration anatomy, improve functional outcomen.  After pre-op clearance the patient was taken to the operating room on 07/05/2014 and underwent  Procedure(s): OPEN REDUCTION INTERNAL FIXATION (ORIF) PROXIMAL HUMERUS FRACTURE.    Patient was given perioperative antibiotics: Anti-infectives    Start     Dose/Rate Route Frequency Ordered Stop   07/05/14 1200  ceFAZolin (ANCEF) IVPB 1 g/50 mL premix     1 g 100 mL/hr over 30 Minutes Intravenous Every 6 hours 07/05/14 1052 07/06/14 0005   07/05/14 0630  ceFAZolin (ANCEF) IVPB 2 g/50 mL premix     2 g 100 mL/hr over 30 Minutes Intravenous To ShortStay Surgical 07/04/14 1404 07/05/14 0735       Patient was given sequential compression devices, early ambulation, and ASA 325mg  BID to prevent  DVT.  Patient benefited maximally from hospital stay and there were no complications.    Recent vital signs: Patient Vitals for the past 24 hrs:  BP Temp Pulse Resp SpO2  07/06/14 0508 134/68 mmHg 97.8 F (36.6 C) 88 18 100 %  07/06/14 0026 132/64 mmHg 97.7 F (36.5 C) 86 18 100 %  07/05/14 1959 131/63 mmHg 97.8 F (36.6 C) 90 18 100 %  07/05/14 1043 136/70 mmHg 97.6 F (36.4 C) 88 18 92 %  07/05/14 1015 - - 86 16 100 %  07/05/14 1010 (!) 143/76 mmHg - 88 20 100 %  07/05/14 1000 - - 84 14 100 %  07/05/14 0956 131/66 mmHg - 98 19 100 %  07/05/14 0952 131/66 mmHg 97.6 F (36.4 C) 96 19 100 %     Recent laboratory studies:  Recent Labs  07/04/14 0855 07/06/14 0549  WBC 5.0 9.4  HGB 12.5 10.4*  HCT 38.1 31.9*  PLT 270 257  NA 141  --   K 4.2  --   CL 104  --   CO2 28  --   BUN 12  --   CREATININE 0.62  --   GLUCOSE 108*  --   CALCIUM 9.3  --      Discharge Medications:     Medication List    STOP taking these medications        ibuprofen  600 MG tablet  Commonly known as:  ADVIL,MOTRIN      TAKE these medications        buPROPion 150 MG 24 hr tablet  Commonly known as:  WELLBUTRIN XL  TAKE 1 TABLET BY MOUTH EVERY DAY     docusate sodium 100 MG capsule  Commonly known as:  COLACE  Take 1 capsule (100 mg total) by mouth 3 (three) times daily as needed.     fluticasone 50 MCG/ACT nasal spray  Commonly known as:  FLONASE  Place 2 sprays into the nose daily.     ibandronate 150 MG tablet  Commonly known as:  BONIVA  Take 150 mg by mouth every 30 (thirty) days. Take in the morning with a full glass of water, on an empty stomach, and do not take anything else by mouth or lie down for the next 30 min.     ondansetron 4 MG tablet  Commonly known as:  ZOFRAN  Take 1 tablet (4 mg total) by mouth every 6 (six) hours.     oxyCODONE-acetaminophen 5-325 MG per tablet  Commonly known as:  PERCOCET/ROXICET  Take 1-2 tablets by mouth every 4 (four) hours as needed  for severe pain.     STOOL SOFTENER PO  Take 1 tablet by mouth daily as needed (constipation).        Diagnostic Studies: Dg Shoulder Right  07/05/2014   CLINICAL DATA:  ORIF right shoulder fracture  EXAM: DG C-ARM 61-120 MIN; RIGHT SHOULDER - 2+ VIEW  COMPARISON:  06/17/2014  FLUOROSCOPY TIME:  Radiation Exposure Index (as provided by the fluoroscopic device):  If the device does not provide the exposure index:  Fluoroscopy Time:  28 seconds  Number of Acquired Images:  0  FINDINGS: Two intraoperative spot images demonstrate plate and screw fixation across the right humeral neck fracture. Anatomic alignment. No hardware complicating feature.  IMPRESSION: Internal fixation as above.   Electronically Signed   By: Rolm Baptise M.D.   On: 07/05/2014 10:00   Dg Shoulder Right  06/17/2014   CLINICAL DATA:  Initial encounter for fall today landing on right arm. Now with right shoulder pain.  EXAM: RIGHT SHOULDER - 2+ VIEW  COMPARISON:  None.  FINDINGS: Two views study shows a comminuted fracture of the right humeral neck. Greater tuberosity exists as a free fragment. No associated glenoid fracture. Acromioclavicular distance is preserved.  IMPRESSION: Comminuted proximal humerus fracture.   Electronically Signed   By: Misty Stanley M.D.   On: 06/17/2014 14:31   Dg Forearm Right  06/17/2014   CLINICAL DATA:  Acute right forearm pain after fall today. Initial encounter.  EXAM: RIGHT FOREARM - 2 VIEW  COMPARISON:  None.  FINDINGS: There is no evidence of fracture or other focal bone lesions. Soft tissues are unremarkable.  IMPRESSION: Normal right forearm.   Electronically Signed   By: Marijo Conception, M.D.   On: 06/17/2014 14:31   Ct Shoulder Right Wo Contrast  06/22/2014   CLINICAL DATA:  Comminuted fracture of the right proximal humerus.  EXAM: CT OF THE RIGHT SHOULDER WITHOUT CONTRAST  TECHNIQUE: Multidetector CT imaging was performed according to the standard protocol. Multiplanar CT image reconstructions  were also generated.  COMPARISON:  06/17/2014  FINDINGS: Fracture of the surgical neck right humerus noted with 1.1 cm medial displacement of the shaft fracture fragment with respect to the humeral head fragment. Obliquely longitudinal extension of the fracture in the shaft is nondisplaced on image 19  of series 603.  There is a nondisplaced fracture the greater tuberosity of the humerus. Similarly, the lesser tuberosity is fractured but in a nondisplaced fashion.  Lipohemarthrosis noted. No glenoid fracture ; the inferior glenoid is lots along the fracture plane with the medial lip of the shaft component of the fracture extending just below the glenoid, as on image 35 series 602.  IMPRESSION: 1. Displaced surgical neck fracture of the proximal humerus by about 1.1 cm. Nondisplaced fractures of the greater and lesser tuberosity. Because the tuberosity fractures are not displaced, they do not qualify as separate "parts" and accordingly this only qualifies for classification as a 2-part fracture despite the tuberosity involvement. 2. Linear nondisplaced extension of the fracture longitudinally in the metadiaphysis. 3. Lipohemarthrosis.   Electronically Signed   By: Van Clines M.D.   On: 06/22/2014 08:32   Dg Humerus Right  06/17/2014   CLINICAL DATA:  Trauma. Fall today. Right arm pain. Initial encounter.  EXAM: RIGHT HUMERUS - 2+ VIEW  COMPARISON:  Right shoulder radiograph from the same day.  FINDINGS: A comminuted fracture involves the surgical neck of the right humerus and right humeral head. The distal humerus is intact. A free fragment involves the greater tuberosity. The right hemi thorax and scapula are intact.  IMPRESSION: 1. Comminuted fracture involving the right humeral head and surgical neck.   Electronically Signed   By: San Morelle M.D.   On: 06/17/2014 14:31   Dg C-arm 61-120 Min  07/05/2014   CLINICAL DATA:  ORIF right shoulder fracture  EXAM: DG C-ARM 61-120 MIN; RIGHT SHOULDER -  2+ VIEW  COMPARISON:  06/17/2014  FLUOROSCOPY TIME:  Radiation Exposure Index (as provided by the fluoroscopic device):  If the device does not provide the exposure index:  Fluoroscopy Time:  28 seconds  Number of Acquired Images:  0  FINDINGS: Two intraoperative spot images demonstrate plate and screw fixation across the right humeral neck fracture. Anatomic alignment. No hardware complicating feature.  IMPRESSION: Internal fixation as above.   Electronically Signed   By: Rolm Baptise M.D.   On: 07/05/2014 10:00    Disposition: 01-Home or Self Care      Discharge Instructions    Call MD / Call 911    Complete by:  As directed   If you experience chest pain or shortness of breath, CALL 911 and be transported to the hospital emergency room.  If you develope a fever above 101 F, pus (white drainage) or increased drainage or redness at the wound, or calf pain, call your surgeon's office.     Constipation Prevention    Complete by:  As directed   Drink plenty of fluids.  Prune juice may be helpful.  You may use a stool softener, such as Colace (over the counter) 100 mg twice a day.  Use MiraLax (over the counter) for constipation as needed.     Diet - low sodium heart healthy    Complete by:  As directed      Increase activity slowly as tolerated    Complete by:  As directed            Follow-up Information    Follow up with Nita Sells, MD. Schedule an appointment as soon as possible for a visit in 2 weeks.   Specialty:  Orthopedic Surgery   Contact information:   [address] [place] [address] [phone]        Signed: Grier Mitts 07/06/2014, 7:47 AM

## 2014-07-07 ENCOUNTER — Ambulatory Visit: Payer: Commercial Managed Care - PPO

## 2014-07-28 ENCOUNTER — Other Ambulatory Visit: Payer: Self-pay | Admitting: Gastroenterology

## 2014-07-28 DIAGNOSIS — K862 Cyst of pancreas: Secondary | ICD-10-CM

## 2014-08-04 ENCOUNTER — Ambulatory Visit
Admission: RE | Admit: 2014-08-04 | Discharge: 2014-08-04 | Disposition: A | Discharge status: Home / Self Care | Payer: Commercial Managed Care - PPO | Source: Ambulatory Visit | Attending: Gastroenterology | Admitting: Gastroenterology

## 2014-08-04 DIAGNOSIS — K862 Cyst of pancreas: Secondary | ICD-10-CM

## 2014-09-15 ENCOUNTER — Telehealth: Payer: Self-pay

## 2014-09-15 NOTE — Telephone Encounter (Signed)
For reimbursement pt request dates of pap; advised 03/17/2014. Pt voiced understanding.

## 2014-10-25 ENCOUNTER — Other Ambulatory Visit: Payer: Self-pay | Admitting: Family Medicine

## 2014-10-27 ENCOUNTER — Ambulatory Visit
Admission: RE | Admit: 2014-10-27 | Discharge: 2014-10-27 | Disposition: A | Discharge status: Home / Self Care | Payer: Commercial Managed Care - PPO | Source: Ambulatory Visit

## 2014-10-27 DIAGNOSIS — Z1231 Encounter for screening mammogram for malignant neoplasm of breast: Secondary | ICD-10-CM

## 2015-02-25 ENCOUNTER — Other Ambulatory Visit: Payer: Self-pay | Admitting: Family Medicine

## 2015-03-23 ENCOUNTER — Ambulatory Visit (INDEPENDENT_AMBULATORY_CARE_PROVIDER_SITE_OTHER): Payer: Commercial Managed Care - PPO | Admitting: Family Medicine

## 2015-03-23 ENCOUNTER — Encounter: Payer: Self-pay | Admitting: Family Medicine

## 2015-03-23 VITALS — BP 110/62 | HR 80 | Temp 98.2°F | Ht 60.5 in | Wt 151.2 lb

## 2015-03-23 DIAGNOSIS — F32A Depression, unspecified: Secondary | ICD-10-CM

## 2015-03-23 DIAGNOSIS — M1990 Unspecified osteoarthritis, unspecified site: Secondary | ICD-10-CM

## 2015-03-23 DIAGNOSIS — B182 Chronic viral hepatitis C: Secondary | ICD-10-CM

## 2015-03-23 DIAGNOSIS — Z01419 Encounter for gynecological examination (general) (routine) without abnormal findings: Secondary | ICD-10-CM | POA: Diagnosis not present

## 2015-03-23 DIAGNOSIS — J309 Allergic rhinitis, unspecified: Secondary | ICD-10-CM

## 2015-03-23 DIAGNOSIS — F329 Major depressive disorder, single episode, unspecified: Secondary | ICD-10-CM | POA: Diagnosis not present

## 2015-03-23 LAB — CBC WITH DIFFERENTIAL/PLATELET
Basophils Absolute: 0 10*3/uL (ref 0.0–0.1)
Basophils Relative: 0.6 % (ref 0.0–3.0)
Eosinophils Absolute: 0.1 10*3/uL (ref 0.0–0.7)
Eosinophils Relative: 1 % (ref 0.0–5.0)
HCT: 39.4 % (ref 36.0–46.0)
Hemoglobin: 13.3 g/dL (ref 12.0–15.0)
Lymphocytes Relative: 34.3 % (ref 12.0–46.0)
Lymphs Abs: 2.7 10*3/uL (ref 0.7–4.0)
MCHC: 33.8 g/dL (ref 30.0–36.0)
MCV: 89.9 fL (ref 78.0–100.0)
Monocytes Absolute: 0.5 10*3/uL (ref 0.1–1.0)
Monocytes Relative: 6.2 % (ref 3.0–12.0)
Neutro Abs: 4.5 10*3/uL (ref 1.4–7.7)
Neutrophils Relative %: 57.9 % (ref 43.0–77.0)
Platelets: 241 10*3/uL (ref 150.0–400.0)
RBC: 4.39 Mil/uL (ref 3.87–5.11)
RDW: 13.3 % (ref 11.5–15.5)
WBC: 7.8 10*3/uL (ref 4.0–10.5)

## 2015-03-23 LAB — LIPID PANEL
Cholesterol: 204 mg/dL — ABNORMAL HIGH (ref 0–200)
HDL: 65.9 mg/dL (ref >39.00)
LDL Cholesterol: 127 mg/dL — ABNORMAL HIGH (ref 0–99)
NonHDL: 138.47
Total CHOL/HDL Ratio: 3
Triglycerides: 56 mg/dL (ref 0.0–149.0)
VLDL: 11.2 mg/dL (ref 0.0–40.0)

## 2015-03-23 LAB — TSH: TSH: 0.99 u[IU]/mL (ref 0.35–4.50)

## 2015-03-23 LAB — COMPREHENSIVE METABOLIC PANEL
ALT: 15 U/L (ref 0–35)
AST: 22 U/L (ref 0–37)
Albumin: 4.4 g/dL (ref 3.5–5.2)
Alkaline Phosphatase: 58 U/L (ref 39–117)
BUN: 12 mg/dL (ref 6–23)
CO2: 31 mEq/L (ref 19–32)
Calcium: 9.6 mg/dL (ref 8.4–10.5)
Chloride: 103 mEq/L (ref 96–112)
Creatinine, Ser: 0.79 mg/dL (ref 0.40–1.20)
GFR: 79.12 mL/min (ref >60.00)
Glucose, Bld: 94 mg/dL (ref 70–99)
Potassium: 4.2 mEq/L (ref 3.5–5.1)
Sodium: 142 meq/L (ref 135–145)
Total Bilirubin: 0.8 mg/dL (ref 0.2–1.2)
Total Protein: 7.3 g/dL (ref 6.0–8.3)

## 2015-03-23 NOTE — Addendum Note (Signed)
Addended by: Lucille Passy on: 03/23/2015 01:17 PM   Modules accepted: Miquel Dunn

## 2015-03-23 NOTE — Progress Notes (Signed)
Pre visit review using our clinic review tool, if applicable. No additional management support is needed unless otherwise documented below in the visit note. 

## 2015-03-23 NOTE — Patient Instructions (Signed)
Good to see you. We will call you with your lab results or you can see them online.

## 2015-03-23 NOTE — Assessment & Plan Note (Signed)
Reviewed preventive care protocols, scheduled due services, and updated immunizations Discussed nutrition, exercise, diet, and healthy lifestyle.  

## 2015-03-23 NOTE — Progress Notes (Addendum)
Subjective:   Patient ID: Nicole Calhoun, female    DOB: 01-04-1956, 60 y.o.   MRN: [id number]  Nicole Calhoun is a pleasant 60 y.o. year old female who presents to clinic today with Annual Exam; Gastroesophageal Reflux; and Dysphagia  on 03/23/2015  HPI:  Hepatitis C- Doing well.  Followed by Dr. Earlean Shawl for Hep C- finished course of Harvoni last year.  Mammogram 10/30/2014 Colonoscopy 10/2007  Last pap smear was done by me in 03/17/14  No h/o abnormal pap smears or post menopausal bleeding.  Depression- feels Wellbutrin is working well.  Current Outpatient Prescriptions on File Prior to Visit  Medication Sig Dispense Refill  . buPROPion (WELLBUTRIN XL) 150 MG 24 hr tablet Take 1 tablet (150 mg total) by mouth daily. MUST SCHEDULE ANNUAL PHYSICAL FOR MORE REFILLS 30 tablet 4  . Docusate Calcium (STOOL SOFTENER PO) Take 1 tablet by mouth daily as needed (constipation).    Marland Kitchen docusate sodium (COLACE) 100 MG capsule Take 1 capsule (100 mg total) by mouth 3 (three) times daily as needed. 20 capsule 0  . fluticasone (FLONASE) 50 MCG/ACT nasal spray Place 2 sprays into the nose daily. 16 g 6   No current facility-administered medications on file prior to visit.    Allergies  Allergen Reactions  . Codeine Nausea Only    Past Medical History  Diagnosis Date  . Depression   . Pancreatic cyst   . Coronary artery disease     Mild  . Hard of hearing   . PONV (postoperative nausea and vomiting)   . Dysrhythmia   . GERD (gastroesophageal reflux disease)   . History of hiatal hernia   . Fibromyalgia   . Myocardial infarction [facility])     "I was told I'd had a light heart attack one time; maybe 2001"  . Sleep apnea     "have mask; haven't worn it in years" (07/05/2014)  . Hepatitis C     "I took the treatment and was cleared" (07/05/2014)  . Arthritis     "all over" (07/04/2104)  . Chronic lower back pain     Past Surgical History  Procedure Laterality Date  . Foot surgery  Left 04/28/2012    EXCISION OF PLANTAR FIBROMA , PLANTAR ASPECT LEFT ARCH  . Cesarean section  X 3  . Cardiovascular stress test  06/08/2009    No scintigraphic evidence of inducible myocardial ischemia.  . Transthoracic echocardiogram  06/08/2009    EF >55%, normal LV systolic function  . Ganglion cyst excision Bilateral     "2 on the right; 1 on the left"  . Cataract extraction w/ intraocular lens  implant, bilateral Bilateral   . Orif proximal humerus fracture Right 07/05/2014  . Orif humerus fracture Right 07/05/2014    Procedure: OPEN REDUCTION INTERNAL FIXATION (ORIF) PROXIMAL HUMERUS FRACTURE;  Surgeon: Tania Ade, MD;  Location: [place];  Service: Orthopedics;  Laterality: Right;  Open reduction internal fixation right shoulder     Family History  Problem Relation Age of Onset  . Arthritis Mother   . Heart disease Mother 94  . Stroke Mother 27  . Cancer Father     Colon cancer  . Stroke Father 98    Social History   Social History  . Marital Status: Married    Spouse Name: N/A  . Number of Children: N/A  . Years of Education: N/A   Occupational History  . Not on file.   Social History Main Topics  .  Smoking status: Former Smoker -- 0.50 packs/day for 8 years    Types: Cigarettes  . Smokeless tobacco: Never Used     Comment: "quit smoking cigarettes in the 1980's"  . Alcohol Use: Yes     Comment: "used to drink when I was younger"  . Drug Use: No  . Sexual Activity: Yes   Other Topics Concern  . Not on file   Social History Narrative   The PMH, PSH, Social History, Family History, Medications, and allergies have been reviewed in [facility], and have been updated if relevant.   Review of Systems  Constitutional: Negative.   HENT: Negative.   Eyes: Negative.   Respiratory: Negative.   Cardiovascular: Negative.   Gastrointestinal: Negative.   Endocrine: Negative.   Genitourinary: Negative.   Musculoskeletal: Negative for arthralgias.  Skin: Negative.     Allergic/Immunologic: Negative.   Neurological: Negative.   Hematological: Negative.   Psychiatric/Behavioral: Negative.   All other systems reviewed and are negative.      Objective:    BP 110/62 mmHg  Pulse 80  Temp(Src) 98.2 F (36.8 C) (Oral)  Ht 5' 0.5" (1.537 m)  Wt 151 lb 4 oz (68.607 kg)  BMI 29.04 kg/m2  SpO2 99%   Physical Exam    General:  Well-developed,well-nourished,in no acute distress; alert,appropriate and cooperative throughout examination Head:  normocephalic and atraumatic.   Eyes:  vision grossly intact, pupils equal, pupils round, and pupils reactive to light.   Ears:  R ear normal and L ear normal.   Nose:  no external deformity.   Mouth:  good dentition.   Neck:  No deformities, masses, or tenderness noted. Breasts:  No mass, nodules, thickening, tenderness, bulging, retraction, inflamation, nipple discharge or skin changes noted.   Lungs:  Normal respiratory effort, chest expands symmetrically. Lungs are clear to auscultation, no crackles or wheezes. Heart:  Normal rate and regular rhythm. S1 and S2 normal without gallop, murmur, click, rub or other extra sounds. Abdomen:  Bowel sounds positive,abdomen soft and non-tender without masses, organomegaly or hernias noted. Msk:  No deformity or scoliosis noted of thoracic or lumbar spine.   Extremities:  No clubbing, cyanosis, edema, or deformity noted with normal full range of motion of all joints.   Neurologic:  alert & oriented X3 and gait normal.   Skin:  Intact without suspicious lesions or rashes Cervical Nodes:  No lymphadenopathy noted Axillary Nodes:  No palpable lymphadenopathy Psych:  Cognition and judgment appear intact. Alert and cooperative with normal attention span and concentration. No apparent delusions, illusions, hallucinations      Assessment & Plan:   Well woman exam with routine gynecological exam - Plan: CBC with Differential/Platelet, Comprehensive metabolic panel, Lipid  panel, TSH  Chronic hepatitis C without hepatic coma (HCC)  Depression  Arthritis  Allergic rhinitis, unspecified allergic rhinitis type No Follow-up on file.

## 2015-03-23 NOTE — Assessment & Plan Note (Signed)
Well controlled on current dose of wellbutrin. No changes made.

## 2015-03-23 NOTE — Assessment & Plan Note (Signed)
Doing well s/p Harvonai. Followed by Dr. Earlean Shawl

## 2015-03-27 ENCOUNTER — Encounter: Payer: Self-pay | Admitting: *Deleted

## 2015-07-11 ENCOUNTER — Other Ambulatory Visit: Payer: Self-pay | Admitting: Family Medicine

## 2015-07-23 ENCOUNTER — Other Ambulatory Visit: Payer: Self-pay | Admitting: Family Medicine

## 2015-08-31 ENCOUNTER — Telehealth: Payer: Self-pay | Admitting: *Deleted

## 2015-08-31 NOTE — Telephone Encounter (Signed)
PT brought in a physical form to be signed. Please let her know when it is completed [phone]. Form placed in prescription tower.

## 2015-09-04 NOTE — Telephone Encounter (Signed)
Pt called checking on insurance form Best number [phone] She needs ASAP Please advise pt when she can pick up form

## 2015-09-04 NOTE — Telephone Encounter (Signed)
Patient notified form is ready for pick up. °

## 2015-10-06 ENCOUNTER — Other Ambulatory Visit: Payer: Self-pay | Admitting: Obstetrics & Gynecology

## 2015-10-06 DIAGNOSIS — Z1231 Encounter for screening mammogram for malignant neoplasm of breast: Secondary | ICD-10-CM

## 2015-11-16 ENCOUNTER — Ambulatory Visit
Admission: RE | Admit: 2015-11-16 | Discharge: 2015-11-16 | Disposition: A | Discharge status: Home / Self Care | Payer: Commercial Managed Care - PPO | Source: Ambulatory Visit | Attending: Obstetrics & Gynecology | Admitting: Obstetrics & Gynecology

## 2015-11-16 DIAGNOSIS — Z1231 Encounter for screening mammogram for malignant neoplasm of breast: Secondary | ICD-10-CM

## 2016-02-09 ENCOUNTER — Other Ambulatory Visit: Payer: Self-pay | Admitting: Family Medicine

## 2016-03-28 ENCOUNTER — Ambulatory Visit (INDEPENDENT_AMBULATORY_CARE_PROVIDER_SITE_OTHER): Payer: Commercial Managed Care - PPO | Admitting: Family Medicine

## 2016-03-28 ENCOUNTER — Encounter: Payer: Self-pay | Admitting: Family Medicine

## 2016-03-28 VITALS — BP 136/80 | HR 75 | Temp 98.4°F | Ht 61.0 in | Wt 137.0 lb

## 2016-03-28 DIAGNOSIS — F329 Major depressive disorder, single episode, unspecified: Secondary | ICD-10-CM | POA: Diagnosis not present

## 2016-03-28 DIAGNOSIS — F32A Depression, unspecified: Secondary | ICD-10-CM

## 2016-03-28 DIAGNOSIS — B182 Chronic viral hepatitis C: Secondary | ICD-10-CM

## 2016-03-28 DIAGNOSIS — M25549 Pain in joints of unspecified hand: Secondary | ICD-10-CM

## 2016-03-28 DIAGNOSIS — Z01419 Encounter for gynecological examination (general) (routine) without abnormal findings: Secondary | ICD-10-CM | POA: Diagnosis not present

## 2016-03-28 LAB — COMPREHENSIVE METABOLIC PANEL
ALT: 18 U/L (ref 0–35)
AST: 23 U/L (ref 0–37)
Albumin: 4.2 g/dL (ref 3.5–5.2)
Alkaline Phosphatase: 54 U/L (ref 39–117)
BUN: 13 mg/dL (ref 6–23)
CO2: 31 mEq/L (ref 19–32)
Calcium: 9.6 mg/dL (ref 8.4–10.5)
Chloride: 102 mEq/L (ref 96–112)
Creatinine, Ser: 0.72 mg/dL (ref 0.40–1.20)
GFR: 87.76 mL/min (ref >60.00)
Glucose, Bld: 88 mg/dL (ref 70–99)
Potassium: 4.4 meq/L (ref 3.5–5.1)
Sodium: 141 mEq/L (ref 135–145)
Total Bilirubin: 0.7 mg/dL (ref 0.2–1.2)
Total Protein: 7.4 g/dL (ref 6.0–8.3)

## 2016-03-28 LAB — LIPID PANEL
Cholesterol: 200 mg/dL (ref 0–200)
HDL: 64.1 mg/dL (ref >39.00)
LDL Cholesterol: 123 mg/dL — ABNORMAL HIGH (ref 0–99)
NonHDL: 136.03
Total CHOL/HDL Ratio: 3
Triglycerides: 64 mg/dL (ref 0.0–149.0)
VLDL: 12.8 mg/dL (ref 0.0–40.0)

## 2016-03-28 LAB — CBC WITH DIFFERENTIAL/PLATELET
Basophils Absolute: 0 10*3/uL (ref 0.0–0.1)
Basophils Relative: 0.6 % (ref 0.0–3.0)
Eosinophils Absolute: 0.1 10*3/uL (ref 0.0–0.7)
Eosinophils Relative: 1.3 % (ref 0.0–5.0)
HCT: 41.1 % (ref 36.0–46.0)
Hemoglobin: 13.9 g/dL (ref 12.0–15.0)
Lymphocytes Relative: 35.1 % (ref 12.0–46.0)
Lymphs Abs: 2.1 10*3/uL (ref 0.7–4.0)
MCHC: 33.7 g/dL (ref 30.0–36.0)
MCV: 91.2 fL (ref 78.0–100.0)
Monocytes Absolute: 0.4 10*3/uL (ref 0.1–1.0)
Monocytes Relative: 7 % (ref 3.0–12.0)
Neutro Abs: 3.3 10*3/uL (ref 1.4–7.7)
Neutrophils Relative %: 56 % (ref 43.0–77.0)
Platelets: 231 10*3/uL (ref 150.0–400.0)
RBC: 4.51 Mil/uL (ref 3.87–5.11)
RDW: 13.2 % (ref 11.5–15.5)
WBC: 5.9 10*3/uL (ref 4.0–10.5)

## 2016-03-28 LAB — TSH: TSH: 2.44 u[IU]/mL (ref 0.35–4.50)

## 2016-03-28 MED ORDER — BUPROPION HCL ER (XL) 150 MG PO TB24: 150.0000 mg | ORAL_TABLET | Freq: Every day | ORAL | 3 refills | Status: DC

## 2016-03-28 NOTE — Patient Instructions (Addendum)
Great to see you.  We will call you with your lab results.  Try OTC pepcid for reflux.  Let me know how that works.

## 2016-03-28 NOTE — Progress Notes (Signed)
Pre visit review using our clinic review tool, if applicable. No additional management support is needed unless otherwise documented below in the visit note. 

## 2016-03-28 NOTE — Progress Notes (Signed)
Subjective:   Patient ID: Nicole Calhoun, female    DOB: Aug 30, 1955, 61 y.o.   MRN: [id number]  Nicole Calhoun is a pleasant 61 y.o. year old female who presents to clinic today with Annual Exam (Sees GYN. Pretty sure she had a Pap last year. Next appt 05-09-16.); Hand Pain; and Back Pain  on 03/28/2016  HPI:  Has GYN- pap appt scheduled for 05/09/16 Mammogram 11/16/15  Colonoscopy 10/2007  Depression- feels Wellbutrin is working well.  Hepatitis C- Doing well.  Followed by Dr. Earlean Shawl for Hep C- finished course of Harvoni two years ago.  Lab Results  Component Value Date   CHOL 204 (H) 03/23/2015   HDL 65.90 03/23/2015   LDLCALC 127 (H) 03/23/2015   TRIG 56.0 03/23/2015   CHOLHDL 3 03/23/2015   Lab Results  Component Value Date   CREATININE 0.79 03/23/2015   Lab Results  Component Value Date   NA 142 03/23/2015   K 4.2 03/23/2015   CL 103 03/23/2015   CO2 31 03/23/2015   Lab Results  Component Value Date   WBC 7.8 03/23/2015   HGB 13.3 03/23/2015   HCT 39.4 03/23/2015   MCV 89.9 03/23/2015   PLT 241.0 03/23/2015   Lab Results  Component Value Date   TSH 0.99 03/23/2015    Current Outpatient Prescriptions on File Prior to Visit  Medication Sig Dispense Refill  . cyclobenzaprine (FLEXERIL) 5 MG tablet Take by mouth.    . docusate sodium (COLACE) 100 MG capsule Take 1 capsule (100 mg total) by mouth 3 (three) times daily as needed. 20 capsule 0  . fluticasone (FLONASE) 50 MCG/ACT nasal spray PLACE 2 SPRAYS INTO THE NOSE DAILY. 16 g 5  . ibuprofen (ADVIL,MOTRIN) 800 MG tablet Take by mouth.     No current facility-administered medications on file prior to visit.     Allergies  Allergen Reactions  . Codeine Nausea Only    Past Medical History:  Diagnosis Date  . Arthritis    "all over" (07/04/2104)  . Chronic lower back pain   . Coronary artery disease    Mild  . Depression   . Dysrhythmia   . Fibromyalgia   . GERD (gastroesophageal reflux  disease)   . Hard of hearing   . Hepatitis C    "I took the treatment and was cleared" (07/05/2014)  . History of hiatal hernia   . Myocardial infarction    "I was told I'd had a light heart attack one time; maybe 2001"  . Pancreatic cyst   . PONV (postoperative nausea and vomiting)   . Sleep apnea    "have mask; haven't worn it in years" (07/05/2014)    Past Surgical History:  Procedure Laterality Date  . CARDIOVASCULAR STRESS TEST  06/08/2009   No scintigraphic evidence of inducible myocardial ischemia.  Marland Kitchen CATARACT EXTRACTION W/ INTRAOCULAR LENS  IMPLANT, BILATERAL Bilateral   . CESAREAN SECTION  X 3  . FOOT SURGERY Left 04/28/2012   EXCISION OF PLANTAR FIBROMA , PLANTAR ASPECT LEFT ARCH  . GANGLION CYST EXCISION Bilateral    "2 on the right; 1 on the left"  . ORIF HUMERUS FRACTURE Right 07/05/2014   Procedure: OPEN REDUCTION INTERNAL FIXATION (ORIF) PROXIMAL HUMERUS FRACTURE;  Surgeon: Tania Ade, MD;  Location: [place];  Service: Orthopedics;  Laterality: Right;  Open reduction internal fixation right shoulder   . ORIF PROXIMAL HUMERUS FRACTURE Right 07/05/2014  . TRANSTHORACIC ECHOCARDIOGRAM  06/08/2009   EF >55%,  normal LV systolic function    Family History  Problem Relation Age of Onset  . Arthritis Mother   . Heart disease Mother 41  . Stroke Mother 84  . Cancer Father     Colon cancer  . Stroke Father 70    Social History   Social History  . Marital status: Married    Spouse name: N/A  . Number of children: N/A  . Years of education: N/A   Occupational History  . Not on file.   Social History Main Topics  . Smoking status: Former Smoker    Packs/day: 0.50    Years: 8.00    Types: Cigarettes  . Smokeless tobacco: Never Used     Comment: "quit smoking cigarettes in the 1980's"  . Alcohol use Yes     Comment: "used to drink when I was younger"  . Drug use: No  . Sexual activity: Yes   Other Topics Concern  . Not on file   Social History Narrative    . No narrative on file   The PMH, PSH, Social History, Family History, Medications, and allergies have been reviewed in [facility], and have been updated if relevant.   Review of Systems  Constitutional: Negative.   HENT: Negative.   Eyes: Negative.   Respiratory: Negative.   Cardiovascular: Negative.   Gastrointestinal: Negative.  Negative for abdominal distention, abdominal pain, anal bleeding, blood in stool, constipation, diarrhea, nausea, rectal pain and vomiting.       +REFLUX symptoms  Endocrine: Negative.   Genitourinary: Negative.   Musculoskeletal: Positive for arthralgias and back pain.  Allergic/Immunologic: Negative.   Neurological: Negative.   Hematological: Negative.   Psychiatric/Behavioral: Negative.   All other systems reviewed and are negative.      Objective:    BP 136/80 (BP Location: Left Arm, Patient Position: Sitting, Cuff Size: Normal)   Pulse 75   Temp 98.4 F (36.9 C) (Oral)   Ht 5\' 1"  (1.549 m)   Wt 137 lb (62.1 kg)   SpO2 99%   BMI 25.89 kg/m    Physical Exam    General:  Well-developed,well-nourished,in no acute distress; alert,appropriate and cooperative throughout examination Head:  normocephalic and atraumatic.   Eyes:  vision grossly intact, PERRL Ears:  R ear normal and L ear normal externally, TMs clear bilaterally Nose:  no external deformity.   Mouth:  good dentition.   Neck:  No deformities, masses, or tenderness noted. Lungs:  Normal respiratory effort, chest expands symmetrically. Lungs are clear to auscultation, no crackles or wheezes. Heart:  Normal rate and regular rhythm. S1 and S2 normal without gallop, murmur, click, rub or other extra sounds. Abdomen:  Bowel sounds positive,abdomen soft and non-tender without masses, organomegaly or hernias noted. Msk:  No deformity or scoliosis noted of thoracic or lumbar spine.   Extremities:  No clubbing, cyanosis, edema, or deformity noted with normal full range of motion of all  joints.   Neurologic:  alert & oriented X3 and gait normal.   Skin:  Intact without suspicious lesions or rashes Cervical Nodes:  No lymphadenopathy noted Axillary Nodes:  No palpable lymphadenopathy Psych:  Cognition and judgment appear intact. Alert and cooperative with normal attention span and concentration. No apparent delusions, illusions, hallucinations      Assessment & Plan:   Well woman exam with routine gynecological exam - Plan: CBC with Differential/Platelet, Comprehensive metabolic panel, Lipid panel, TSH  Depression, unspecified depression type  Chronic hepatitis C without hepatic coma ([place])  Well woman exam No Follow-up on file.

## 2016-03-28 NOTE — Assessment & Plan Note (Signed)
Well controlled on current dose of wellbutrin. NO changes made to rxs today.

## 2016-03-28 NOTE — Assessment & Plan Note (Signed)
Reviewed preventive care protocols, scheduled due services, and updated immunizations Discussed nutrition, exercise, diet, and healthy lifestyle.  Orders Placed This Encounter  Procedures  . CBC with Differential/Platelet  . Comprehensive metabolic panel  . Lipid panel  . TSH     

## 2016-03-29 LAB — RHEUMATOID FACTOR: Rheumatoid fact SerPl-aCnc: <14 [IU]/mL (ref <14)

## 2016-04-01 ENCOUNTER — Telehealth: Payer: Self-pay | Admitting: Family Medicine

## 2016-04-01 NOTE — Telephone Encounter (Signed)
Patient returned Shannon's call. °

## 2016-05-10 DIAGNOSIS — M47816 Spondylosis without myelopathy or radiculopathy, lumbar region: Secondary | ICD-10-CM | POA: Insufficient documentation

## 2016-06-27 ENCOUNTER — Ambulatory Visit (INDEPENDENT_AMBULATORY_CARE_PROVIDER_SITE_OTHER): Payer: Commercial Managed Care - PPO | Admitting: Family Medicine

## 2016-06-27 ENCOUNTER — Encounter: Payer: Self-pay | Admitting: Family Medicine

## 2016-06-27 DIAGNOSIS — L819 Disorder of pigmentation, unspecified: Secondary | ICD-10-CM | POA: Diagnosis not present

## 2016-06-27 DIAGNOSIS — R300 Dysuria: Secondary | ICD-10-CM

## 2016-06-27 LAB — POC URINALSYSI DIPSTICK (AUTOMATED)
Bilirubin, UA: NEGATIVE
Blood, UA: NEGATIVE
Glucose, UA: NEGATIVE
Ketones, UA: NEGATIVE
Leukocytes, UA: NEGATIVE
Nitrite, UA: NEGATIVE
Protein, UA: NEGATIVE
Spec Grav, UA: 1.02 (ref 1.010–1.025)
Urobilinogen, UA: 0.2 U/dL
pH, UA: 6 (ref 5.0–8.0)

## 2016-06-27 NOTE — Progress Notes (Signed)
SUBJECTIVE: Nicole Calhoun is a 60 y.o. female who complains of urinary frequency, urgency and dysuria x 20 days, without flank pain, fever, chills, or abnormal vaginal discharge or bleeding.   Current Outpatient Prescriptions on File Prior to Visit  Medication Sig Dispense Refill  . buPROPion (WELLBUTRIN XL) 150 MG 24 hr tablet Take 1 tablet (150 mg total) by mouth daily. 90 tablet 3  . cyclobenzaprine (FLEXERIL) 5 MG tablet Take by mouth.    . docusate sodium (COLACE) 100 MG capsule Take 1 capsule (100 mg total) by mouth 3 (three) times daily as needed. 20 capsule 0  . fluticasone (FLONASE) 50 MCG/ACT nasal spray PLACE 2 SPRAYS INTO THE NOSE DAILY. 16 g 5  . ibuprofen (ADVIL,MOTRIN) 800 MG tablet Take by mouth.     No current facility-administered medications on file prior to visit.     Allergies  Allergen Reactions  . Codeine Nausea Only    Past Medical History:  Diagnosis Date  . Arthritis    "all over" (07/04/2104)  . Chronic lower back pain   . Coronary artery disease    Mild  . Depression   . Dysrhythmia   . Fibromyalgia   . GERD (gastroesophageal reflux disease)   . Hard of hearing   . Hepatitis C    "I took the treatment and was cleared" (07/05/2014)  . History of hiatal hernia   . Myocardial infarction [facility])    "I was told I'd had a light heart attack one time; maybe 2001"  . Pancreatic cyst   . PONV (postoperative nausea and vomiting)   . Sleep apnea    "have mask; haven't worn it in years" (07/05/2014)    Past Surgical History:  Procedure Laterality Date  . CARDIOVASCULAR STRESS TEST  06/08/2009   No scintigraphic evidence of inducible myocardial ischemia.  Marland Kitchen CATARACT EXTRACTION W/ INTRAOCULAR LENS  IMPLANT, BILATERAL Bilateral   . CESAREAN SECTION  X 3  . FOOT SURGERY Left 04/28/2012   EXCISION OF PLANTAR FIBROMA , PLANTAR ASPECT LEFT ARCH  . GANGLION CYST EXCISION Bilateral    "2 on the right; 1 on the left"  . ORIF HUMERUS FRACTURE Right 07/05/2014   Procedure: OPEN REDUCTION INTERNAL FIXATION (ORIF) PROXIMAL HUMERUS FRACTURE;  Surgeon: Tania Ade, MD;  Location: [place];  Service: Orthopedics;  Laterality: Right;  Open reduction internal fixation right shoulder   . ORIF PROXIMAL HUMERUS FRACTURE Right 07/05/2014  . TRANSTHORACIC ECHOCARDIOGRAM  06/08/2009   EF >55%, normal LV systolic function    Family History  Problem Relation Age of Onset  . Arthritis Mother   . Heart disease Mother 17  . Stroke Mother 68  . Cancer Father        Colon cancer  . Stroke Father 2    Social History   Social History  . Marital status: Married    Spouse name: N/A  . Number of children: N/A  . Years of education: N/A   Occupational History  . Not on file.   Social History Main Topics  . Smoking status: Former Smoker    Packs/day: 0.50    Years: 8.00    Types: Cigarettes  . Smokeless tobacco: Never Used     Comment: "quit smoking cigarettes in the 1980's"  . Alcohol use Yes     Comment: "used to drink when I was younger"  . Drug use: No  . Sexual activity: Yes   Other Topics Concern  . Not on file  Social History Narrative  . No narrative on file   The PMH, PSH, Social History, Family History, Medications, and allergies have been reviewed in [facility], and have been updated if relevant.  OBJECTIVE:  BP 120/80   Pulse 87   Ht 5\' 1"  (1.549 m)   Wt 138 lb (62.6 kg)   SpO2 100%   BMI 26.07 kg/m   Appears well, in no apparent distress.  Vital signs are normal. The abdomen is soft without tenderness, guarding, mass, rebound or organomegaly. No CVA tenderness or inguinal adenopathy noted. Urine dipstick shows negative for all components.    ASSESSMENT: Dysuria- neg UA  PLAN: Frequency may be due to bladder irritants... Drink water, avoid alcohol, caffeine, soda, citris, tomato, spicy foods.  Treatment per orders - also push fluids, may use Pyridium OTC prn. Call or return to clinic prn if these symptoms worsen or fail to improve as  anticipated.

## 2016-06-27 NOTE — Progress Notes (Deleted)
Subjective:   Patient ID: Nicole Calhoun, female    DOB: 1955/09/20, 61 y.o.   MRN: [id number]  Nicole Calhoun is a pleasant 61 y.o. year old female who presents to clinic today with DISCOLORED SKIN ("TOES BLACK AND PURPLE") and Dysuria  on 06/27/2016  HPI:  Dysuria- a couple of weeks of intermittent dysuria.   Current Outpatient Prescriptions on File Prior to Visit  Medication Sig Dispense Refill  . buPROPion (WELLBUTRIN XL) 150 MG 24 hr tablet Take 1 tablet (150 mg total) by mouth daily. 90 tablet 3  . cyclobenzaprine (FLEXERIL) 5 MG tablet Take by mouth.    . docusate sodium (COLACE) 100 MG capsule Take 1 capsule (100 mg total) by mouth 3 (three) times daily as needed. 20 capsule 0  . fluticasone (FLONASE) 50 MCG/ACT nasal spray PLACE 2 SPRAYS INTO THE NOSE DAILY. 16 g 5  . ibuprofen (ADVIL,MOTRIN) 800 MG tablet Take by mouth.     No current facility-administered medications on file prior to visit.     Allergies  Allergen Reactions  . Codeine Nausea Only    Past Medical History:  Diagnosis Date  . Arthritis    "all over" (07/04/2104)  . Chronic lower back pain   . Coronary artery disease    Mild  . Depression   . Dysrhythmia   . Fibromyalgia   . GERD (gastroesophageal reflux disease)   . Hard of hearing   . Hepatitis C    "I took the treatment and was cleared" (07/05/2014)  . History of hiatal hernia   . Myocardial infarction [facility])    "I was told I'd had a light heart attack one time; maybe 2001"  . Pancreatic cyst   . PONV (postoperative nausea and vomiting)   . Sleep apnea    "have mask; haven't worn it in years" (07/05/2014)    Past Surgical History:  Procedure Laterality Date  . CARDIOVASCULAR STRESS TEST  06/08/2009   No scintigraphic evidence of inducible myocardial ischemia.  Marland Kitchen CATARACT EXTRACTION W/ INTRAOCULAR LENS  IMPLANT, BILATERAL Bilateral   . CESAREAN SECTION  X 3  . FOOT SURGERY Left 04/28/2012   EXCISION OF PLANTAR FIBROMA , PLANTAR  ASPECT LEFT ARCH  . GANGLION CYST EXCISION Bilateral    "2 on the right; 1 on the left"  . ORIF HUMERUS FRACTURE Right 07/05/2014   Procedure: OPEN REDUCTION INTERNAL FIXATION (ORIF) PROXIMAL HUMERUS FRACTURE;  Surgeon: Tania Ade, MD;  Location: [place];  Service: Orthopedics;  Laterality: Right;  Open reduction internal fixation right shoulder   . ORIF PROXIMAL HUMERUS FRACTURE Right 07/05/2014  . TRANSTHORACIC ECHOCARDIOGRAM  06/08/2009   EF >55%, normal LV systolic function    Family History  Problem Relation Age of Onset  . Arthritis Mother   . Heart disease Mother 53  . Stroke Mother 13  . Cancer Father        Colon cancer  . Stroke Father 70    Social History   Social History  . Marital status: Married    Spouse name: N/A  . Number of children: N/A  . Years of education: N/A   Occupational History  . Not on file.   Social History Main Topics  . Smoking status: Former Smoker    Packs/day: 0.50    Years: 8.00    Types: Cigarettes  . Smokeless tobacco: Never Used     Comment: "quit smoking cigarettes in the 1980's"  . Alcohol use Yes  Comment: "used to drink when I was younger"  . Drug use: No  . Sexual activity: Yes   Other Topics Concern  . Not on file   Social History Narrative  . No narrative on file   The PMH, PSH, Social History, Family History, Medications, and allergies have been reviewed in [facility], and have been updated if relevant.   Review of Systems     Objective:    BP 120/80   Pulse 87   Ht 5\' 1"  (1.549 m)   Wt 138 lb (62.6 kg)   SpO2 100%   BMI 26.07 kg/m    Physical Exam        Assessment & Plan:   Dysuria  Discoloration of skin of toe No Follow-up on file.

## 2016-06-27 NOTE — Progress Notes (Signed)
Pre visit review using our clinic review tool, if applicable. No additional management support is needed unless otherwise documented below in the visit note. 

## 2016-08-15 ENCOUNTER — Telehealth: Payer: Self-pay | Admitting: Family Medicine

## 2016-08-15 NOTE — Telephone Encounter (Signed)
Patient brought in a Proof of Physical form to be filled out by the Provider.  The form was placed in the Provider's prescription incoming box.

## 2016-08-16 NOTE — Telephone Encounter (Signed)
Placed in Dr. Aron's in-basket for signature.  

## 2016-08-20 NOTE — Telephone Encounter (Signed)
Patient was notified, form placed in yellow folder.

## 2016-08-20 NOTE — Telephone Encounter (Signed)
Yes form is ready.

## 2016-08-20 NOTE — Telephone Encounter (Signed)
Patient is calling to find out if proof of physical form is ready.  Patient can be reached at [phone].

## 2016-08-28 ENCOUNTER — Other Ambulatory Visit: Payer: Self-pay | Admitting: Surgery

## 2016-08-28 DIAGNOSIS — S39012S Strain of muscle, fascia and tendon of lower back, sequela: Secondary | ICD-10-CM

## 2016-09-17 ENCOUNTER — Ambulatory Visit
Admission: RE | Admit: 2016-09-17 | Discharge: 2016-09-17 | Disposition: A | Discharge status: Home / Self Care | Payer: Worker's Compensation | Source: Ambulatory Visit | Attending: Surgery | Admitting: Surgery

## 2016-09-17 DIAGNOSIS — S39012S Strain of muscle, fascia and tendon of lower back, sequela: Secondary | ICD-10-CM

## 2016-09-25 ENCOUNTER — Telehealth: Payer: Self-pay

## 2016-09-25 NOTE — Telephone Encounter (Signed)
PA initiated for Bupropion HCL ER 24 hr tablet, 150 mg through [email or web address] Awaiting approval

## 2016-09-27 NOTE — Telephone Encounter (Addendum)
Received fax from [employer] that states Bupropion XL 150 mg is on patient's list of covered drugs.  Prior authorization is not required at this time.  [facility] [address] notified via fax.

## 2016-10-02 NOTE — Telephone Encounter (Signed)
Pt left v/m wanting to get bupropion refilled today. I spoke with Olivia Mackie at [employer] and she said Insurance is requiring a PA. Call [phone]. If no PA cost to pt is $97.59.

## 2016-10-02 NOTE — Telephone Encounter (Signed)
Called the number provided and spoke with Pieter Partridge at [facility] and he states this number is to bill for workers comp, and wanted to know were we trying to bill through this and I stated no, he states the pharmacy then needs to bill to the correct insurance.   Called the pharmacy and spoke with Summer the pharmacist, who ran it under other insurance and she states it would be $21.13. Spoke with pt and she is ok with that total. She had no additional questions at this time. She will contact us if she has any trouble at the pharmacy. Nothing further is needed

## 2016-10-10 ENCOUNTER — Other Ambulatory Visit: Payer: Self-pay | Admitting: Obstetrics and Gynecology

## 2016-10-10 DIAGNOSIS — Z1231 Encounter for screening mammogram for malignant neoplasm of breast: Secondary | ICD-10-CM

## 2016-10-11 DIAGNOSIS — M720 Palmar fascial fibromatosis [Dupuytren]: Secondary | ICD-10-CM | POA: Insufficient documentation

## 2016-10-11 DIAGNOSIS — M19042 Primary osteoarthritis, left hand: Secondary | ICD-10-CM

## 2016-10-11 DIAGNOSIS — M19041 Primary osteoarthritis, right hand: Secondary | ICD-10-CM | POA: Insufficient documentation

## 2016-10-20 IMAGING — CT CT SHOULDER*R* W/O CM
1 of 2 series · 9 of 14 positions shown, 12 images · non-contrast
Comparison: 06/17/2014

CLINICAL DATA: Comminuted fracture of the right proximal humerus.

EXAM:
CT OF THE RIGHT SHOULDER WITHOUT CONTRAST
TECHNIQUE: Multidetector CT imaging was performed according to the standard
protocol. Multiplanar CT image reconstructions were also generated.

[Series 3: shoulder 3.0 b41s · axial · 0.51mm/px · z∈[-229,-61]mm · 9 of 71 slices shown, 12 images]
[im 8/71  soft-tissue]
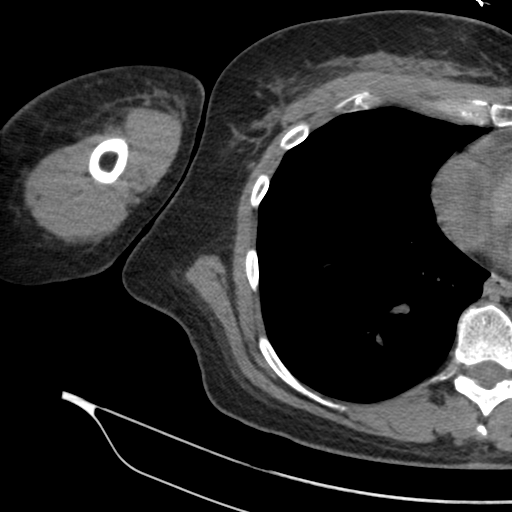
[im 8/71  bone]
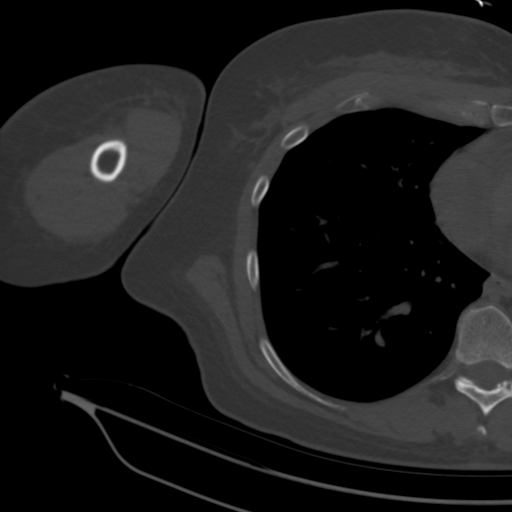
[im 15/71  bone]
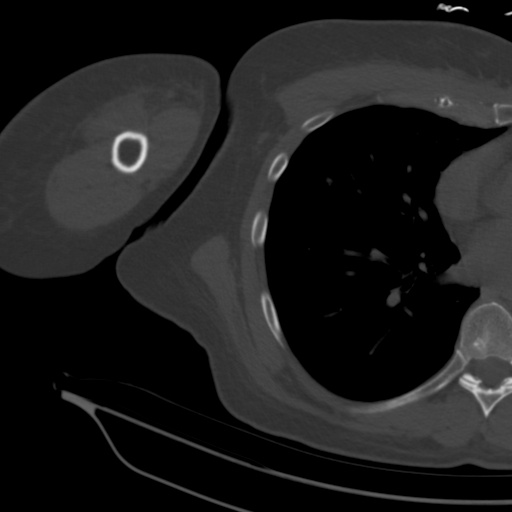
[im 22/71  bone]
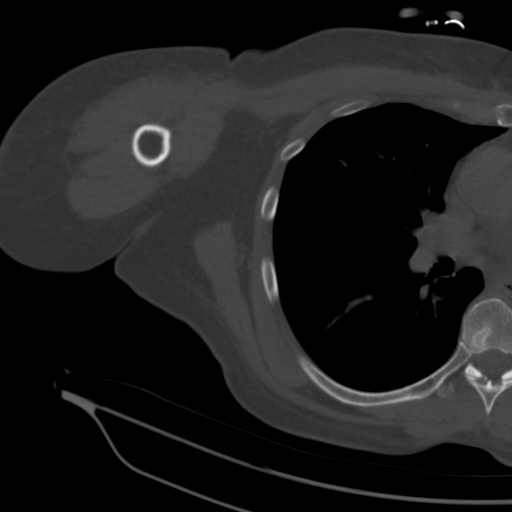
[im 29/71  bone]
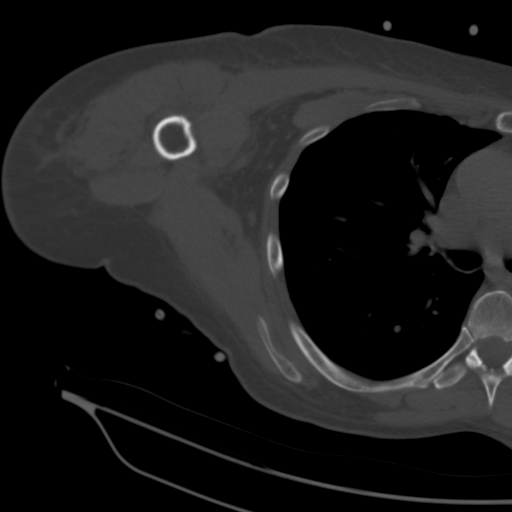
[im 36/71  soft-tissue]
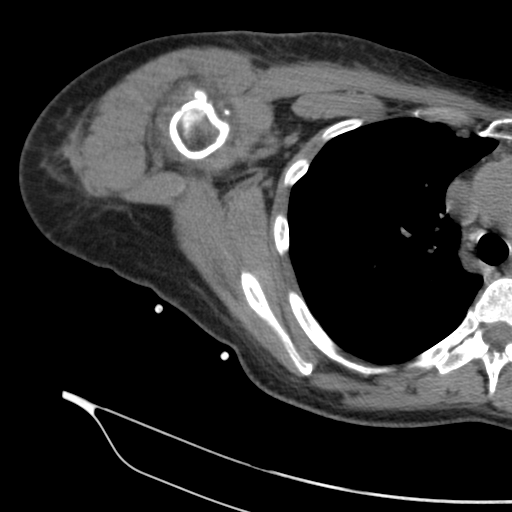
[im 36/71  bone]
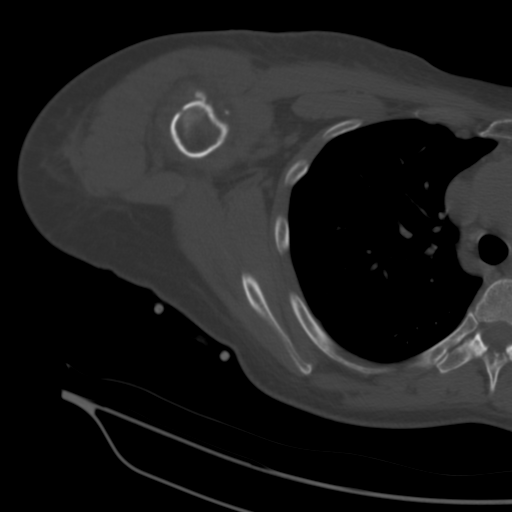
[im 43/71  bone]
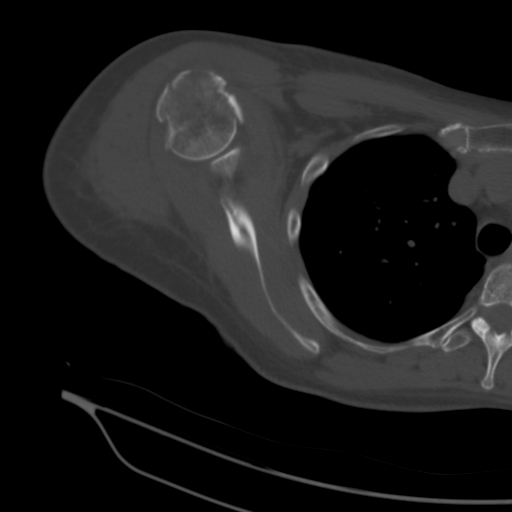
[im 50/71  bone]
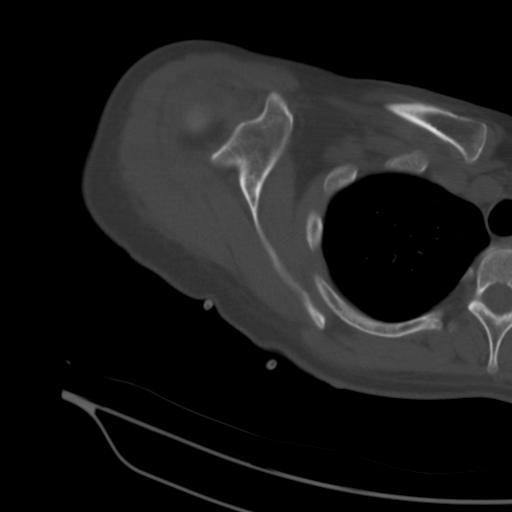
[im 57/71  bone]
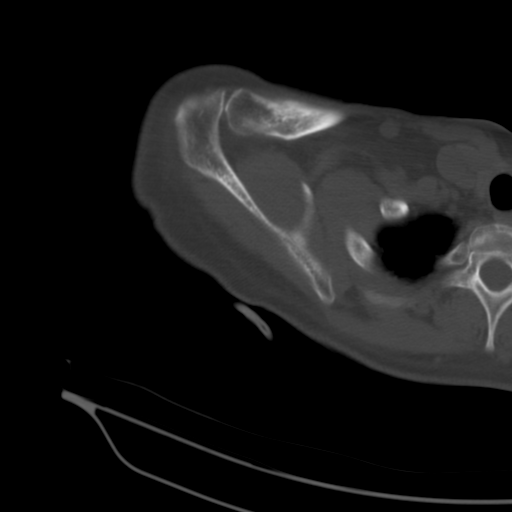
[im 64/71  soft-tissue]
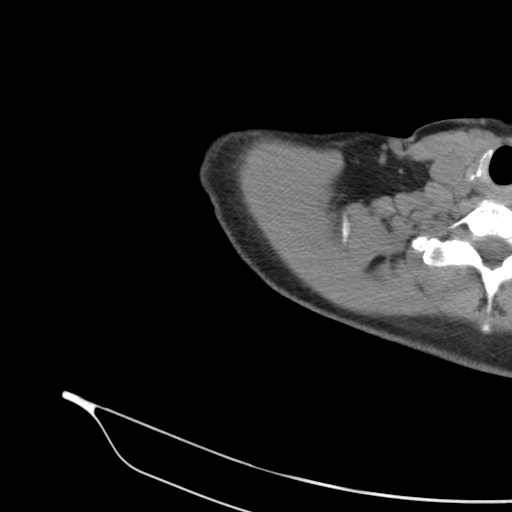
[im 64/71  bone]
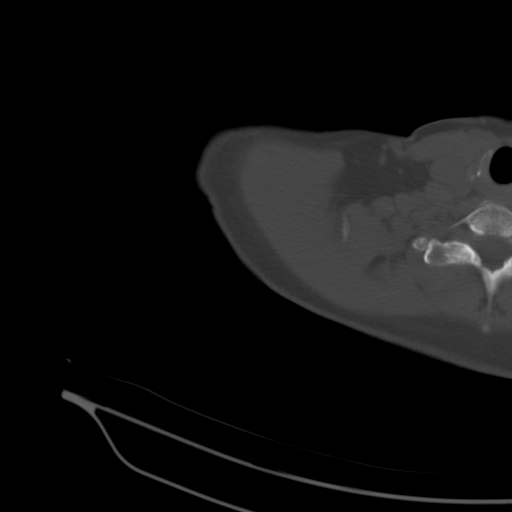

[9 of 14 positions shown; findings below may reference images not displayed]

FINDINGS: Fracture of the surgical neck right humerus noted with 1.1 cm medial
displacement of the shaft fracture fragment with respect to the
humeral head fragment. Obliquely longitudinal extension of the
fracture in the shaft is nondisplaced on image 19 of series 603.

There is a nondisplaced fracture the greater tuberosity of the
humerus. Similarly, the lesser tuberosity is fractured but in a
nondisplaced fashion.

Lipohemarthrosis noted. No glenoid fracture ; the inferior glenoid
is lots along the fracture plane with the medial lip of the shaft
component of the fracture extending just below the glenoid, as on
image 35 series 602.
IMPRESSION: 1. Displaced surgical neck fracture of the proximal humerus by about
1.1 cm. Nondisplaced fractures of the greater and lesser tuberosity.
Because the tuberosity fractures are not displaced, they do not
qualify as separate "parts" and accordingly this only qualifies for
classification as a 2-part fracture despite the tuberosity
involvement.
2. Linear nondisplaced extension of the fracture longitudinally in
the metadiaphysis.
3. Lipohemarthrosis.

## 2016-11-07 ENCOUNTER — Ambulatory Visit (INDEPENDENT_AMBULATORY_CARE_PROVIDER_SITE_OTHER): Payer: Commercial Managed Care - PPO | Admitting: Family Medicine

## 2016-11-07 ENCOUNTER — Encounter: Payer: Self-pay | Admitting: Family Medicine

## 2016-11-07 DIAGNOSIS — L918 Other hypertrophic disorders of the skin: Secondary | ICD-10-CM | POA: Insufficient documentation

## 2016-11-07 NOTE — Progress Notes (Signed)
Subjective:   Patient ID: Nicole Calhoun, female    DOB: Feb 07, 1955, 61 y.o.   MRN: [id number]  Nicole Calhoun is a pleasant 61 y.o. year old female who presents to clinic today with Skin Problem (Patient is here today to have a mole checked on her right clavicle area.  It has grown in size and became sore over 3 weeks.  H/O Basal Cell CA on leg.)  on 11/07/2016  HPI:  Mole on her right clavicle- has grown in size and become sore over the past 3 weeks.  Remote h/o basal cell CA on her leg.   Current Outpatient Prescriptions on File Prior to Visit  Medication Sig Dispense Refill  . buPROPion (WELLBUTRIN XL) 150 MG 24 hr tablet Take 1 tablet (150 mg total) by mouth daily. 90 tablet 3  . cyclobenzaprine (FLEXERIL) 5 MG tablet Take by mouth.    . docusate sodium (COLACE) 100 MG capsule Take 1 capsule (100 mg total) by mouth 3 (three) times daily as needed. 20 capsule 0  . fluticasone (FLONASE) 50 MCG/ACT nasal spray PLACE 2 SPRAYS INTO THE NOSE DAILY. 16 g 5  . ibuprofen (ADVIL,MOTRIN) 800 MG tablet Take by mouth.     No current facility-administered medications on file prior to visit.     Allergies  Allergen Reactions  . Codeine Nausea Only    Past Medical History:  Diagnosis Date  . Arthritis    "all over" (07/04/2104)  . Chronic lower back pain   . Coronary artery disease    Mild  . Depression   . Dysrhythmia   . Fibromyalgia   . GERD (gastroesophageal reflux disease)   . Hard of hearing   . Hepatitis C    "I took the treatment and was cleared" (07/05/2014)  . History of hiatal hernia   . Myocardial infarction [facility])    "I was told I'd had a light heart attack one time; maybe 2001"  . Pancreatic cyst   . PONV (postoperative nausea and vomiting)   . Sleep apnea    "have mask; haven't worn it in years" (07/05/2014)    Past Surgical History:  Procedure Laterality Date  . CARDIOVASCULAR STRESS TEST  06/08/2009   No scintigraphic evidence of inducible myocardial  ischemia.  Marland Kitchen CATARACT EXTRACTION W/ INTRAOCULAR LENS  IMPLANT, BILATERAL Bilateral   . CESAREAN SECTION  X 3  . FOOT SURGERY Left 04/28/2012   EXCISION OF PLANTAR FIBROMA , PLANTAR ASPECT LEFT ARCH  . GANGLION CYST EXCISION Bilateral    "2 on the right; 1 on the left"  . ORIF HUMERUS FRACTURE Right 07/05/2014   Procedure: OPEN REDUCTION INTERNAL FIXATION (ORIF) PROXIMAL HUMERUS FRACTURE;  Surgeon: Tania Ade, MD;  Location: [place];  Service: Orthopedics;  Laterality: Right;  Open reduction internal fixation right shoulder   . ORIF PROXIMAL HUMERUS FRACTURE Right 07/05/2014  . TRANSTHORACIC ECHOCARDIOGRAM  06/08/2009   EF >55%, normal LV systolic function    Family History  Problem Relation Age of Onset  . Arthritis Mother   . Heart disease Mother 72  . Stroke Mother 72  . Cancer Father        Colon cancer  . Stroke Father 78    Social History   Social History  . Marital status: Married    Spouse name: N/A  . Number of children: N/A  . Years of education: N/A   Occupational History  . Not on file.   Social History Main Topics  .  Smoking status: Former Smoker    Packs/day: 0.50    Years: 8.00    Types: Cigarettes  . Smokeless tobacco: Never Used     Comment: "quit smoking cigarettes in the 1980's"  . Alcohol use Yes     Comment: "used to drink when I was younger"  . Drug use: No  . Sexual activity: Yes   Other Topics Concern  . Not on file   Social History Narrative  . No narrative on file   The PMH, PSH, Social History, Family History, Medications, and allergies have been reviewed in [facility], and have been updated if relevant.   Review of Systems  All other systems reviewed and are negative.      Objective:    BP 136/60 (BP Location: Left Arm, Patient Position: Sitting, Cuff Size: Normal)   Pulse 74   Temp 98.2 F (36.8 C) (Oral)   Ht 5\' 1"  (1.549 m)   Wt 143 lb (64.9 kg)   SpO2 99%   BMI 27.02 kg/m    Physical Exam  Constitutional: She is  oriented to person, place, and time. She appears well-developed and well-nourished. No distress.  HENT:  Head: Normocephalic and atraumatic.  Eyes: Conjunctivae are normal.  Cardiovascular: Normal rate.   Pulmonary/Chest: Effort normal.  Musculoskeletal: Normal range of motion.  Neurological: She is alert and oriented to person, place, and time.  Skin: Skin is warm and dry. She is not diaphoretic.  inflamed skin tag right clavicle  Psychiatric: She has a normal mood and affect. Her behavior is normal. Judgment and thought content normal.  Nursing note and vitals reviewed.         Assessment & Plan:   Inflamed skin tag No Follow-up on file.

## 2016-11-07 NOTE — Assessment & Plan Note (Signed)
   Skin tags is snipped off using Betadine for cleansing and sterile iris scissors. Local anesthesia was  used. These pathognomonic lesions are not sent for pathology.

## 2016-11-21 ENCOUNTER — Ambulatory Visit: Payer: Self-pay

## 2016-12-20 ENCOUNTER — Other Ambulatory Visit: Payer: Self-pay | Admitting: Obstetrics

## 2016-12-20 DIAGNOSIS — Z139 Encounter for screening, unspecified: Secondary | ICD-10-CM

## 2017-01-23 ENCOUNTER — Ambulatory Visit
Admission: RE | Admit: 2017-01-23 | Discharge: 2017-01-23 | Disposition: A | Discharge status: Home / Self Care | Payer: Commercial Managed Care - PPO | Source: Ambulatory Visit | Attending: Obstetrics | Admitting: Obstetrics

## 2017-01-23 DIAGNOSIS — Z139 Encounter for screening, unspecified: Secondary | ICD-10-CM

## 2017-02-10 ENCOUNTER — Other Ambulatory Visit: Payer: Self-pay | Admitting: Family Medicine

## 2017-02-21 ENCOUNTER — Ambulatory Visit: Payer: Commercial Managed Care - PPO | Admitting: Family Medicine

## 2017-02-21 ENCOUNTER — Encounter: Payer: Self-pay | Admitting: Family Medicine

## 2017-02-21 VITALS — BP 118/66 | HR 83 | Temp 98.5°F | Wt 143.5 lb

## 2017-02-21 DIAGNOSIS — F32A Depression, unspecified: Secondary | ICD-10-CM

## 2017-02-21 DIAGNOSIS — M1990 Unspecified osteoarthritis, unspecified site: Secondary | ICD-10-CM | POA: Diagnosis not present

## 2017-02-21 DIAGNOSIS — F329 Major depressive disorder, single episode, unspecified: Secondary | ICD-10-CM

## 2017-02-21 DIAGNOSIS — Z7689 Persons encountering health services in other specified circumstances: Secondary | ICD-10-CM | POA: Diagnosis not present

## 2017-02-21 MED ORDER — GABAPENTIN 100 MG PO CAPS: ORAL_CAPSULE | ORAL | 3 refills | Status: DC

## 2017-02-21 MED ORDER — BUPROPION HCL ER (XL) 150 MG PO TB24: 150.0000 mg | ORAL_TABLET | Freq: Every day | ORAL | 1 refills | Status: DC

## 2017-02-21 NOTE — Patient Instructions (Signed)
Look at foam roller exercises for your back/hips- look at You tube videos  Gabapentin capsules or tablets What is this medicine? GABAPENTIN (GA ba pen tin) is used to control partial seizures in adults with epilepsy. It is also used to treat certain types of nerve pain. This medicine may be used for other purposes; ask your health care provider or pharmacist if you have questions. COMMON BRAND NAME(S): Active-PAC with Gabapentin, Gabarone, Neurontin What should I tell my health care provider before I take this medicine? They need to know if you have any of these conditions: -kidney disease -suicidal thoughts, plans, or attempt; a previous suicide attempt by you or a family member -an unusual or allergic reaction to gabapentin, other medicines, foods, dyes, or preservatives -pregnant or trying to get pregnant -breast-feeding How should I use this medicine? Take this medicine by mouth with a glass of water. Follow the directions on the prescription label. You can take it with or without food. If it upsets your stomach, take it with food.Take your medicine at regular intervals. Do not take it more often than directed. Do not stop taking except on your doctor's advice. If you are directed to break the 600 or 800 mg tablets in half as part of your dose, the extra half tablet should be used for the next dose. If you have not used the extra half tablet within 28 days, it should be thrown away. A special MedGuide will be given to you by the pharmacist with each prescription and refill. Be sure to read this information carefully each time. Talk to your pediatrician regarding the use of this medicine in children. Special care may be needed. Overdosage: If you think you have taken too much of this medicine contact a poison control center or emergency room at once. NOTE: This medicine is only for you. Do not share this medicine with others. What if I miss a dose? If you miss a dose, take it as soon as you  can. If it is almost time for your next dose, take only that dose. Do not take double or extra doses. What may interact with this medicine? Do not take this medicine with any of the following medications: -other gabapentin products This medicine may also interact with the following medications: -alcohol -antacids -antihistamines for allergy, cough and cold -certain medicines for anxiety or sleep -certain medicines for depression or psychotic disturbances -homatropine; hydrocodone -naproxen -narcotic medicines (opiates) for pain -phenothiazines like chlorpromazine, mesoridazine, prochlorperazine, thioridazine This list may not describe all possible interactions. Give your health care provider a list of all the medicines, herbs, non-prescription drugs, or dietary supplements you use. Also tell them if you smoke, drink alcohol, or use illegal drugs. Some items may interact with your medicine. What should I watch for while using this medicine? Visit your doctor or health care professional for regular checks on your progress. You may want to keep a record at home of how you feel your condition is responding to treatment. You may want to share this information with your doctor or health care professional at each visit. You should contact your doctor or health care professional if your seizures get worse or if you have any new types of seizures. Do not stop taking this medicine or any of your seizure medicines unless instructed by your doctor or health care professional. Stopping your medicine suddenly can increase your seizures or their severity. Wear a medical identification bracelet or chain if you are taking this medicine for seizures,  and carry a card that lists all your medications. You may get drowsy, dizzy, or have blurred vision. Do not drive, use machinery, or do anything that needs mental alertness until you know how this medicine affects you. To reduce dizzy or fainting spells, do not sit or  stand up quickly, especially if you are an older patient. Alcohol can increase drowsiness and dizziness. Avoid alcoholic drinks. Your mouth may get dry. Chewing sugarless gum or sucking hard candy, and drinking plenty of water will help. The use of this medicine may increase the chance of suicidal thoughts or actions. Pay special attention to how you are responding while on this medicine. Any worsening of mood, or thoughts of suicide or dying should be reported to your health care professional right away. Women who become pregnant while using this medicine may enroll in the [place] Pregnancy Registry by calling [phone]. This registry collects information about the safety of antiepileptic drug use during pregnancy. What side effects may I notice from receiving this medicine? Side effects that you should report to your doctor or health care professional as soon as possible: -allergic reactions like skin rash, itching or hives, swelling of the face, lips, or tongue -worsening of mood, thoughts or actions of suicide or dying Side effects that usually do not require medical attention (report to your doctor or health care professional if they continue or are bothersome): -constipation -difficulty walking or controlling muscle movements -dizziness -nausea -slurred speech -tiredness -tremors -weight gain This list may not describe all possible side effects. Call your doctor for medical advice about side effects. You may report side effects to FDA at 1-800-FDA-1088. Where should I keep my medicine? Keep out of reach of children. This medicine may cause accidental overdose and death if it taken by other adults, children, or pets. Mix any unused medicine with a substance like cat litter or coffee grounds. Then throw the medicine away in a sealed container like a sealed bag or a coffee can with a lid. Do not use the medicine after the expiration date. Store at room  temperature between 15 and 30 degrees C (59 and 86 degrees F). NOTE: This sheet is a summary. It may not cover all possible information. If you have questions about this medicine, talk to your doctor, pharmacist, or health care provider.  2018 Elsevier/Gold Standard (2013-02-26 15:26:50)

## 2017-02-21 NOTE — Progress Notes (Signed)
est

## 2017-02-21 NOTE — Progress Notes (Signed)
Subjective:    Patient ID: [name], female    DOB: 02-Jul-1955, 62 y.o.   MRN: [id number]  HPI This is a 62 yo female who presents today to establish care. She is a Secretary/administrator at [employer]. Currently living with husband, son/girlfriend/grand daughter-they will be moving out soon. Also has another son and 7 month old grandson.   She has chronic low back pain and takes flexeril and ibuprofen. Does back exercises at home. Has seen chiropractor in past with some relief.   Takes bupropion for her anxiety. Has been battling for about 8 years. Doesn't sleep well between anxiety and arthritis pain.     Last CPE- 03/28/16 Mammo- 01/23/17 Pap- 03/17/14 negative for malignancy, negative for HPV Colonoscopy- 10/2007 Tdap- unknown Flu- annual Eye-annual Dental-regular Exercise- walking, house work Past Medical History:  Diagnosis Date  . Arthritis    "all over" (07/04/2104)  . Chronic lower back pain   . Coronary artery disease    Mild  . Depression   . Dysrhythmia   . Fibromyalgia   . GERD (gastroesophageal reflux disease)   . Hard of hearing   . Hepatitis C    "I took the treatment and was cleared" (07/05/2014)  . History of hiatal hernia   . Myocardial infarction [facility])    "I was told I'd had a light heart attack one time; maybe 2001"  . Pancreatic cyst   . PONV (postoperative nausea and vomiting)   . Sleep apnea    "have mask; haven't worn it in years" (07/05/2014)   Past Surgical History:  Procedure Laterality Date  . CARDIOVASCULAR STRESS TEST  06/08/2009   No scintigraphic evidence of inducible myocardial ischemia.  Marland Kitchen CATARACT EXTRACTION W/ INTRAOCULAR LENS  IMPLANT, BILATERAL Bilateral   . CESAREAN SECTION  X 3  . FOOT SURGERY Left 04/28/2012   EXCISION OF PLANTAR FIBROMA , PLANTAR ASPECT LEFT ARCH  . GANGLION CYST EXCISION Bilateral    "2 on the right; 1 on the left"  . ORIF HUMERUS FRACTURE Right 07/05/2014   Procedure: OPEN REDUCTION INTERNAL FIXATION (ORIF)  PROXIMAL HUMERUS FRACTURE;  Surgeon: Tania Ade, MD;  Location: [place];  Service: Orthopedics;  Laterality: Right;  Open reduction internal fixation right shoulder   . ORIF PROXIMAL HUMERUS FRACTURE Right 07/05/2014  . TRANSTHORACIC ECHOCARDIOGRAM  06/08/2009   EF >55%, normal LV systolic function   Family History  Problem Relation Age of Onset  . Arthritis Mother   . Heart disease Mother 104  . Stroke Mother 108  . Cancer Father        Colon cancer  . Stroke Father 40   Social History   Tobacco Use  . Smoking status: Former Smoker    Packs/day: 0.50    Years: 8.00    Pack years: 4.00    Types: Cigarettes  . Smokeless tobacco: Never Used  . Tobacco comment: "quit smoking cigarettes in the 1980's"  Substance Use Topics  . Alcohol use: Yes    Comment: "used to drink when I was younger"  . Drug use: No        Review of Systems  Constitutional: Negative for fever and unexpected weight change.  Respiratory: Negative for cough, shortness of breath and wheezing.   Cardiovascular: Negative for chest pain, palpitations and leg swelling.  Gastrointestinal: Negative for abdominal pain, constipation, diarrhea, nausea and vomiting.  Genitourinary: Negative for dysuria.  Musculoskeletal: Positive for arthralgias, back pain, joint swelling and myalgias.  Psychiatric/Behavioral: Positive for  sleep disturbance.       Objective:   Physical Exam  Constitutional: She is oriented to person, place, and time. She appears well-developed and well-nourished.  HENT:  Head: Normocephalic and atraumatic.  Cardiovascular: Normal rate, regular rhythm and normal heart sounds.  Pulmonary/Chest: Effort normal and breath sounds normal.  Neurological: She is oriented to person, place, and time.  Skin: Skin is warm and dry.  Psychiatric: She has a normal mood and affect. Her behavior is normal. Judgment and thought content normal.  Vitals reviewed.     BP 118/66   Pulse 83   Temp 98.5 F  (36.9 C) (Oral)   Wt 143 lb 8 oz (65.1 kg)   SpO2 97%   BMI 27.11 kg/m  Wt Readings from Last 3 Encounters:  02/21/17 143 lb 8 oz (65.1 kg)  11/07/16 143 lb (64.9 kg)  06/27/16 138 lb (62.6 kg)        Assessment & Plan:  1. Encounter to establish care - Discussed and encouraged healthy lifestyle choices- adequate sleep, regular exercise, stress management and healthy food choices.   2. Arthritis - discussed role of exercise/ massage - gabapentin (NEURONTIN) 100 MG capsule; Take one tablet at bedtime x 4 days, then increase to 1 tablet twice a day. After 4 days can increase to three times a day.  Dispense: 90 capsule; Refill: 3  3. Depression, unspecified depression type - buPROPion (WELLBUTRIN XL) 150 MG 24 hr tablet; Take 1 tablet (150 mg total) by mouth daily.  Dispense: 90 tablet; Refill: 1  - follow up in 2-3 months for CPE/ assess pain/sleep   Clarene Reamer, FNP-BC  [facility], [place]  02/23/2017 8:23 PM

## 2017-02-23 ENCOUNTER — Encounter: Payer: Self-pay | Admitting: Family Medicine

## 2017-04-02 ENCOUNTER — Ambulatory Visit: Payer: Commercial Managed Care - PPO | Admitting: Family Medicine

## 2017-04-02 ENCOUNTER — Encounter: Payer: Self-pay | Admitting: Family Medicine

## 2017-04-02 VITALS — BP 138/78 | HR 82 | Temp 98.6°F | Wt 140.5 lb

## 2017-04-02 DIAGNOSIS — J069 Acute upper respiratory infection, unspecified: Secondary | ICD-10-CM

## 2017-04-02 DIAGNOSIS — B9789 Other viral agents as the cause of diseases classified elsewhere: Secondary | ICD-10-CM | POA: Diagnosis not present

## 2017-04-02 NOTE — Patient Instructions (Signed)
Take a daily over the counter antihistamine like Allegra, Zyrtec or Claritin- generic is fine  For nasal congestion you can use Afrin nasal spray for 3 days max, Sudafed, saline nasal spray (generic is fine for all). For cough you can try Delsym. Drink enough fluids to make your urine light yellow. For fever/chill/muscle aches you can take over the counter acetaminophen or ibuprofen.  Please come back in if you are not better in 5-7 days or if you develop wheezing, shortness of breath or persistent vomiting.

## 2017-04-02 NOTE — Progress Notes (Signed)
Subjective:    Patient ID: Nicole Calhoun, female    DOB: 18-Jul-1955, 62 y.o.   MRN: [id number]  HPI This is a 62 yo female who presents today with sore throat, nasal congestion, dry cough  X 3 days. No fever, no ear pain, no headache, no muscle aches, no SOB or wheee. Has been taking an OTC cold preparation with a little relief and using saline nasal spray. Her granddaughter, who lives with her, has been sick with similar symptoms this week and the patient works at [employer] where there have been many sick residents recently with cough and runny nose.   Past Medical History:  Diagnosis Date  . Arthritis    "all over" (07/04/2104)  . Chronic lower back pain   . Coronary artery disease    Mild  . Depression   . Dysrhythmia   . Fibromyalgia   . GERD (gastroesophageal reflux disease)   . Hard of hearing   . Hepatitis C    "I took the treatment and was cleared" (07/05/2014)  . History of hiatal hernia   . Myocardial infarction [facility])    "I was told I'd had a light heart attack one time; maybe 2001"  . Pancreatic cyst   . PONV (postoperative nausea and vomiting)   . Sleep apnea    "have mask; haven't worn it in years" (07/05/2014)   Past Surgical History:  Procedure Laterality Date  . CARDIOVASCULAR STRESS TEST  06/08/2009   No scintigraphic evidence of inducible myocardial ischemia.  Marland Kitchen CATARACT EXTRACTION W/ INTRAOCULAR LENS  IMPLANT, BILATERAL Bilateral   . CESAREAN SECTION  X 3  . FOOT SURGERY Left 04/28/2012   EXCISION OF PLANTAR FIBROMA , PLANTAR ASPECT LEFT ARCH  . GANGLION CYST EXCISION Bilateral    "2 on the right; 1 on the left"  . ORIF HUMERUS FRACTURE Right 07/05/2014   Procedure: OPEN REDUCTION INTERNAL FIXATION (ORIF) PROXIMAL HUMERUS FRACTURE;  Surgeon: Tania Ade, MD;  Location: [place];  Service: Orthopedics;  Laterality: Right;  Open reduction internal fixation right shoulder   . ORIF PROXIMAL HUMERUS FRACTURE Right 07/05/2014  . TRANSTHORACIC ECHOCARDIOGRAM   06/08/2009   EF >55%, normal LV systolic function   Family History  Problem Relation Age of Onset  . Arthritis Mother   . Heart disease Mother 104  . Stroke Mother 79  . Cancer Father        Colon cancer  . Stroke Father 43      Review of Systems Per HPI    Objective:   Physical Exam  Constitutional: She is oriented to person, place, and time. She appears well-developed and well-nourished. No distress.  HENT:  Head: Normocephalic and atraumatic.  Right Ear: Tympanic membrane, external ear and ear canal normal.  Left Ear: Tympanic membrane, external ear and ear canal normal.  Nose: Mucosal edema and rhinorrhea present.  Mouth/Throat: Uvula is midline and mucous membranes are normal. Posterior oropharyngeal erythema (post nasal drainage) present. No oropharyngeal exudate, posterior oropharyngeal edema or tonsillar abscesses.  Eyes: Conjunctivae are normal.  Neck: Neck supple.  Cardiovascular: Normal rate, regular rhythm and normal heart sounds.  Pulmonary/Chest: Effort normal and breath sounds normal.  Lymphadenopathy:    She has no cervical adenopathy.  Neurological: She is alert and oriented to person, place, and time.  Skin: Skin is warm and dry. She is not diaphoretic.  Psychiatric: She has a normal mood and affect. Her behavior is normal. Judgment and thought content normal.  Vitals  reviewed.     BP 138/78   Pulse 82   Temp 98.6 F (37 C) (Oral)   Wt 140 lb 8 oz (63.7 kg)   SpO2 97%   BMI 26.55 kg/m      Assessment & Plan:  1. Viral URI with cough - Provided written and verbal information regarding diagnosis and treatment. -  Patient Instructions  Take a daily over the counter antihistamine like Allegra, Zyrtec or Claritin- generic is fine  For nasal congestion you can use Afrin nasal spray for 3 days max, Sudafed, saline nasal spray (generic is fine for all). For cough you can try Delsym. Drink enough fluids to make your urine light yellow. For  fever/chill/muscle aches you can take over the counter acetaminophen or ibuprofen.  Please come back in if you are not better in 5-7 days or if you develop wheezing, shortness of breath or persistent vomiting.     Clarene Reamer, FNP-BC  [facility], [facility]  04/02/2017 3:35 PM

## 2017-05-20 ENCOUNTER — Other Ambulatory Visit: Payer: Self-pay | Admitting: Family Medicine

## 2017-05-20 DIAGNOSIS — M1990 Unspecified osteoarthritis, unspecified site: Secondary | ICD-10-CM

## 2017-05-30 ENCOUNTER — Encounter: Payer: Commercial Managed Care - PPO | Admitting: Family Medicine

## 2017-06-13 ENCOUNTER — Encounter: Payer: Self-pay | Admitting: Family Medicine

## 2017-06-13 ENCOUNTER — Ambulatory Visit (INDEPENDENT_AMBULATORY_CARE_PROVIDER_SITE_OTHER): Payer: Commercial Managed Care - PPO | Admitting: Family Medicine

## 2017-06-13 VITALS — BP 108/60 | HR 81 | Temp 98.2°F | Ht 60.25 in | Wt 137.0 lb

## 2017-06-13 DIAGNOSIS — E7800 Pure hypercholesterolemia, unspecified: Secondary | ICD-10-CM | POA: Diagnosis not present

## 2017-06-13 DIAGNOSIS — Z0000 Encounter for general adult medical examination without abnormal findings: Secondary | ICD-10-CM | POA: Diagnosis not present

## 2017-06-13 LAB — LIPID PANEL
Cholesterol: 210 mg/dL — ABNORMAL HIGH (ref 0–200)
HDL: 62.6 mg/dL (ref >39.00)
LDL Cholesterol: 129 mg/dL — ABNORMAL HIGH (ref 0–99)
NonHDL: 147.56
Total CHOL/HDL Ratio: 3
Triglycerides: 93 mg/dL (ref 0.0–149.0)
VLDL: 18.6 mg/dL (ref 0.0–40.0)

## 2017-06-13 NOTE — Patient Instructions (Addendum)
Good to see you today  Please follow up in 6 months, sooner if any problems  Keeping You Healthy  Get These Tests  Blood Pressure- Have your blood pressure checked by your healthcare provider at least once a year.  Normal blood pressure is 120/80.  Weight- Have your body mass index (BMI) calculated to screen for obesity.  BMI is a measure of body fat based on height and weight.  You can calculate your own BMI at [employer].it  Cholesterol- Have your cholesterol checked every year.  Diabetes- Have your blood sugar checked every year if you have high blood pressure, high cholesterol, a family history of diabetes or if you are overweight.  Pap Test - Have a pap test every 1 to 5 years if you have been sexually active.  If you are older than 65 and recent pap tests have been normal you may not need additional pap tests.  In addition, if you have had a hysterectomy  for benign disease additional pap tests are not necessary.  Mammogram-Yearly mammograms are essential for early detection of breast cancer  Screening for Colon Cancer- Colonoscopy starting at age 44. Screening may begin sooner depending on your family history and other health conditions.  Follow up colonoscopy as directed by your Gastroenterologist.  Screening for Osteoporosis- Screening begins at age 51 with bone density scanning, sooner if you are at higher risk for developing Osteoporosis.  Get these medicines  Calcium with Vitamin D- Your body requires 1200-1500 mg of Calcium a day and [phone] IU of Vitamin D a day.  You can only absorb 500 mg of Calcium at a time therefore Calcium must be taken in 2 or 3 separate doses throughout the day.  Hormones- Hormone therapy has been associated with increased risk for certain cancers and heart disease.  Talk to your healthcare provider about if you need relief from menopausal symptoms.  Aspirin- Ask your healthcare provider about taking Aspirin to prevent Heart Disease  and Stroke.  Get these Immuniztions  Flu shot- Every fall  Pneumonia shot- Once after the age of 69; if you are younger ask your healthcare provider if you need a pneumonia shot.  Tetanus- Every ten years.  Zostavax- Once after the age of 91 to prevent shingles.  Take these steps  Don't smoke- Your healthcare provider can help you quit. For tips on how to quit, ask your healthcare provider or go to www.smokefree.gov or call 1-800 QUIT-NOW.  Be physically active- Exercise 5 days a week for a minimum of 30 minutes.  If you are not already physically active, start slow and gradually work up to 30 minutes of moderate physical activity.  Try walking, dancing, bike riding, swimming, etc.  Eat a healthy diet- Eat a variety of healthy foods such as fruits, vegetables, whole grains, low fat milk, low fat cheeses, yogurt, lean meats, chicken, fish, eggs, dried beans, tofu, etc.  For more information go to www.thenutritionsource.org  Dental visit- Brush and floss teeth twice daily; visit your dentist twice a year.  Eye exam- Visit your Optometrist or Ophthalmologist yearly.  Drink alcohol in moderation- Limit alcohol intake to one drink or less a day.  Never drink and drive.  Depression- Your emotional health is as important as your physical health.  If you're feeling down or losing interest in things you normally enjoy, please talk to your healthcare provider.  Seat Belts- can save your life; always wear one  Smoke/Carbon Monoxide detectors- These detectors need to be installed on  the appropriate level of your home.  Replace batteries at least once a year.  Violence- If anyone is threatening or hurting you, please tell your healthcare provider.  Living Will/ Health care power of attorney- Discuss with your healthcare provider and family.

## 2017-06-13 NOTE — Progress Notes (Signed)
Subjective:    Patient ID: Nicole Calhoun, female    DOB: 03/27/1955, 61 y.o.   MRN: [id number]  HPI This is a 62 yo female who presents today for CPE.   Last CPE- 03/28/16 Mammo- 01/23/17 Pap- saw gyn 3/19 Colonoscopy- 10/2007 Tdap- unknown Flu- annual Eye-annual Dental-regular Exercise- walking, house work  Chronic back pain- didn't start gabapentin, has been seeing chiropractor twice a week with improvement.   Sleep- a little better. Is contemplating trying CBD oil.   Past Medical History:  Diagnosis Date  . Arthritis    "all over" (07/04/2104)  . Chronic lower back pain   . Coronary artery disease    Mild  . Depression   . Dysrhythmia   . Fibromyalgia   . GERD (gastroesophageal reflux disease)   . Hard of hearing   . Hepatitis C    "I took the treatment and was cleared" (07/05/2014)  . History of hiatal hernia   . Myocardial infarction [facility])    "I was told I'd had a light heart attack one time; maybe 2001"  . Pancreatic cyst   . PONV (postoperative nausea and vomiting)   . Sleep apnea    "have mask; haven't worn it in years" (07/05/2014)   Past Surgical History:  Procedure Laterality Date  . CARDIOVASCULAR STRESS TEST  06/08/2009   No scintigraphic evidence of inducible myocardial ischemia.  Marland Kitchen CATARACT EXTRACTION W/ INTRAOCULAR LENS  IMPLANT, BILATERAL Bilateral   . CESAREAN SECTION  X 3  . FOOT SURGERY Left 04/28/2012   EXCISION OF PLANTAR FIBROMA , PLANTAR ASPECT LEFT ARCH  . GANGLION CYST EXCISION Bilateral    "2 on the right; 1 on the left"  . ORIF HUMERUS FRACTURE Right 07/05/2014   Procedure: OPEN REDUCTION INTERNAL FIXATION (ORIF) PROXIMAL HUMERUS FRACTURE;  Surgeon: Tania Ade, MD;  Location: [place];  Service: Orthopedics;  Laterality: Right;  Open reduction internal fixation right shoulder   . ORIF PROXIMAL HUMERUS FRACTURE Right 07/05/2014  . TRANSTHORACIC ECHOCARDIOGRAM  06/08/2009   EF >55%, normal LV systolic function   Family History    Problem Relation Age of Onset  . Arthritis Mother   . Heart disease Mother 35  . Stroke Mother 47  . Cancer Father        Colon cancer  . Stroke Father 60   Social History   Tobacco Use  . Smoking status: Former Smoker    Packs/day: 0.50    Years: 8.00    Pack years: 4.00    Types: Cigarettes  . Smokeless tobacco: Never Used  . Tobacco comment: "quit smoking cigarettes in the 1980's"  Substance Use Topics  . Alcohol use: Yes    Comment: "used to drink when I was younger"  . Drug use: No      Review of Systems  Constitutional: Negative.   HENT: Negative.   Eyes: Negative.   Respiratory: Negative.   Cardiovascular: Negative.   Gastrointestinal: Negative.   Endocrine: Negative.   Genitourinary: Negative.   Musculoskeletal: Positive for back pain (chronic).  Skin: Negative.   Allergic/Immunologic: Negative.   Neurological: Negative.   Hematological: Negative.   Psychiatric/Behavioral: Positive for sleep disturbance (difficulty staying asleep). Negative for dysphoric mood (doing well on current meds).       Objective:   Physical Exam Physical Exam  Constitutional: She is oriented to person, place, and time. She appears well-developed and well-nourished. No distress.  HENT:  Head: Normocephalic and atraumatic.  Right Ear: External ear  normal.  Left Ear: External ear normal.  Nose: Nose normal.  Mouth/Throat: Oropharynx is clear and moist. No oropharyngeal exudate.  Eyes: Conjunctivae are normal. Pupils are equal, round, and reactive to light.  Neck: Normal range of motion. Neck supple. No JVD present. No thyromegaly present.  Cardiovascular: Normal rate, regular rhythm, normal heart sounds and intact distal pulses.   Pulmonary/Chest: Effort normal and breath sounds normal. Right breast exhibits no inverted nipple, no mass, no nipple discharge, no skin change and no tenderness. Left breast exhibits no inverted nipple, no mass, no nipple discharge, no skin change  and no tenderness. Breasts are symmetrical.  Abdominal: Soft. Bowel sounds are normal. She exhibits no distension and no mass. There is no tenderness. There is no rebound and no guarding.  Musculoskeletal: Normal range of motion. Mild tenderness of low back. Normal ROM and strength.  Lymphadenopathy:    She has no cervical adenopathy.  Neurological: She is alert and oriented to person, place, and time. Skin: Skin is warm and dry. She is not diaphoretic.  Psychiatric: She has a normal mood and affect. Her behavior is normal. Judgment and thought content normal.  Vitals reviewed.     BP 108/60 (BP Location: Right Arm, Patient Position: Sitting, Cuff Size: Normal)   Pulse 81   Temp 98.2 F (36.8 C) (Oral)   Ht 5' 0.25" (1.53 m)   Wt 137 lb (62.1 kg)   SpO2 98%   BMI 26.53 kg/m  Wt Readings from Last 3 Encounters:  06/13/17 137 lb (62.1 kg)  04/02/17 140 lb 8 oz (63.7 kg)  02/21/17 143 lb 8 oz (65.1 kg)   Depression screen [facility] 2/9 06/13/2017 03/23/2015  Decreased Interest 0 0  Down, Depressed, Hopeless 0 0  PHQ - 2 Score 0 0       Assessment & Plan:  1. Annual physical exam - Discussed and encouraged healthy lifestyle choices- adequate sleep, regular exercise, stress management and healthy food choices.   2. Elevated LDL cholesterol level - Lipid Panel  - follow up in 6 months  Clarene Reamer, FNP-BC   Primary Care at [facility], [place]  06/17/2017 8:18 PM

## 2017-06-17 ENCOUNTER — Encounter: Payer: Self-pay | Admitting: Family Medicine

## 2017-06-21 ENCOUNTER — Other Ambulatory Visit: Payer: Self-pay | Admitting: Family Medicine

## 2017-06-21 DIAGNOSIS — M1990 Unspecified osteoarthritis, unspecified site: Secondary | ICD-10-CM

## 2017-07-31 DIAGNOSIS — Z800 Family history of malignant neoplasm of digestive organs: Secondary | ICD-10-CM

## 2017-07-31 DIAGNOSIS — K648 Other hemorrhoids: Secondary | ICD-10-CM | POA: Insufficient documentation

## 2017-07-31 HISTORY — DX: Family history of malignant neoplasm of digestive organs: Z80.0

## 2017-07-31 LAB — HM COLONOSCOPY

## 2017-08-05 ENCOUNTER — Encounter: Payer: Self-pay | Admitting: Family Medicine

## 2017-10-03 ENCOUNTER — Telehealth: Payer: Self-pay | Admitting: Family Medicine

## 2017-10-03 ENCOUNTER — Other Ambulatory Visit: Payer: Self-pay | Admitting: *Deleted

## 2017-10-03 DIAGNOSIS — F32A Depression, unspecified: Secondary | ICD-10-CM

## 2017-10-03 DIAGNOSIS — F329 Major depressive disorder, single episode, unspecified: Secondary | ICD-10-CM

## 2017-10-03 MED ORDER — BUPROPION HCL ER (XL) 150 MG PO TB24: 150.0000 mg | ORAL_TABLET | Freq: Every day | ORAL | 1 refills | Status: DC

## 2017-10-03 NOTE — Telephone Encounter (Signed)
Copied from [place] [id number]. Topic: Quick Communication - Rx Refill/Question >> Oct 03, 2017  8:39 AM Bea Graff, NT wrote: Medication: buPROPion (WELLBUTRIN XL) 150 MG 24 hr tablet  Has the patient contacted their pharmacy? Yes.   (Agent: If no, request that the patient contact the pharmacy for the refill.) (Agent: If yes, when and what did the pharmacy advise?)  Preferred Pharmacy (with phone number or street name): [id number] [address], [place] [phone] (Phone) [phone] (Fax)    Agent: Please be advised that RX refills may take up to 3 business days. We ask that you follow-up with your pharmacy.

## 2017-11-12 ENCOUNTER — Other Ambulatory Visit: Payer: Self-pay | Admitting: Family Medicine

## 2017-11-12 DIAGNOSIS — M1990 Unspecified osteoarthritis, unspecified site: Secondary | ICD-10-CM

## 2017-11-12 NOTE — Telephone Encounter (Signed)
Please clarify whether or not she is taking and how many times a day.

## 2017-11-12 NOTE — Telephone Encounter (Signed)
Gabapentin [facility]mg  [facility] [address] [place] 06/23/17 #90 LOV 06/13/17 Next OV- none scheduled No controlled substance agreement No UDS

## 2017-11-18 NOTE — Telephone Encounter (Signed)
Called and spoke to patient she states that she has not been taking this medication. She thought that they were requesting the muscle relaxer. Patient does not need this medication filled at this time. Understanding verbalized nothing further needed at this time.

## 2017-12-17 ENCOUNTER — Other Ambulatory Visit: Payer: Self-pay | Admitting: Obstetrics

## 2017-12-17 DIAGNOSIS — Z1231 Encounter for screening mammogram for malignant neoplasm of breast: Secondary | ICD-10-CM

## 2018-01-19 ENCOUNTER — Telehealth: Payer: Self-pay | Admitting: Family Medicine

## 2018-01-19 ENCOUNTER — Other Ambulatory Visit: Payer: Self-pay | Admitting: Family Medicine

## 2018-01-19 DIAGNOSIS — E2839 Other primary ovarian failure: Secondary | ICD-10-CM

## 2018-01-19 NOTE — Telephone Encounter (Signed)
Pt called to request referral for bone density scan. She has had scans in the past due to a medication and the multiple dx she has. Last scan 07/2013 at [facility].

## 2018-01-19 NOTE — Telephone Encounter (Signed)
Debbie notified as instructed by telephone.  Patient states understanding. 

## 2018-01-19 NOTE — Telephone Encounter (Signed)
Please call patient and let her know that I have put in the order for her bone density test. She can call the [facility] to schedule.

## 2018-01-26 ENCOUNTER — Ambulatory Visit
Admission: RE | Admit: 2018-01-26 | Discharge: 2018-01-26 | Disposition: A | Discharge status: Home / Self Care | Payer: Commercial Managed Care - PPO | Source: Ambulatory Visit | Attending: Obstetrics | Admitting: Obstetrics

## 2018-01-26 DIAGNOSIS — Z1231 Encounter for screening mammogram for malignant neoplasm of breast: Secondary | ICD-10-CM

## 2018-02-26 ENCOUNTER — Other Ambulatory Visit: Payer: Self-pay | Admitting: Family Medicine

## 2018-02-26 DIAGNOSIS — F329 Major depressive disorder, single episode, unspecified: Secondary | ICD-10-CM

## 2018-02-26 DIAGNOSIS — F32A Depression, unspecified: Secondary | ICD-10-CM

## 2018-03-09 ENCOUNTER — Encounter: Payer: Self-pay | Admitting: Family Medicine

## 2018-03-09 ENCOUNTER — Telehealth: Payer: Self-pay

## 2018-03-09 ENCOUNTER — Ambulatory Visit: Payer: Commercial Managed Care - PPO | Admitting: Family Medicine

## 2018-03-09 VITALS — BP 136/78 | HR 88 | Temp 98.2°F | Resp 14 | Ht 60.25 in | Wt 140.0 lb

## 2018-03-09 DIAGNOSIS — J101 Influenza due to other identified influenza virus with other respiratory manifestations: Secondary | ICD-10-CM | POA: Diagnosis not present

## 2018-03-09 DIAGNOSIS — Z20828 Contact with and (suspected) exposure to other viral communicable diseases: Secondary | ICD-10-CM | POA: Diagnosis not present

## 2018-03-09 LAB — POC INFLUENZA A&B (BINAX/QUICKVUE)
Influenza A, POC: POSITIVE — AB
Influenza B, POC: NEGATIVE

## 2018-03-09 MED ORDER — OSELTAMIVIR PHOSPHATE 75 MG PO CAPS: 75.0000 mg | ORAL_CAPSULE | Freq: Two times a day (BID) | ORAL | 0 refills | Status: DC

## 2018-03-09 NOTE — Telephone Encounter (Signed)
[place] note faxed to office pt requesting an appt due to possible flu symptoms. Pt already scheduled appt today and was seen by Glenda Chroman FNP 03/09/18 at [place].

## 2018-03-09 NOTE — Patient Instructions (Signed)
Good to see you today  I have sent in Tamiflu to your pharmacy  For nasal congestion you can use Afrin nasal spray for 3 days max, saline nasal spray (generic is fine for all). For cough you can try Delsym. Drink enough fluids to make your urine light yellow. For fever/chill/muscle aches you can take over the counter acetaminophen or ibuprofen.  Please come back in if you are not better in 5-7 days or if you develop wheezing, shortness of breath or persistent vomiting.   Influenza, Adult Influenza is also called "the flu." It is an infection in the lungs, nose, and throat (respiratory tract). It is caused by a virus. The flu causes symptoms that are similar to symptoms of a cold. It also causes a high fever and body aches. The flu spreads easily from person to person (is contagious). Getting a flu shot (influenza vaccination) every year is the best way to prevent the flu. What are the causes? This condition is caused by the influenza virus. You can get the virus by:  Breathing in droplets that are in the air from the cough or sneeze of a person who has the virus.  Touching something that has the virus on it (is contaminated) and then touching your mouth, nose, or eyes. What increases the risk? Certain things may make you more likely to get the flu. These include:  Not washing your hands often.  Having close contact with many people during cold and flu season.  Touching your mouth, eyes, or nose without first washing your hands.  Not getting a flu shot every year. You may have a higher risk for the flu, along with serious problems such as a lung infection (pneumonia), if you:  Are older than 65.  Are pregnant.  Have a weakened disease-fighting system (immune system) because of a disease or taking certain medicines.  Have a long-term (chronic) illness, such as: ? Heart, kidney, or lung disease. ? Diabetes. ? Asthma.  Have a liver disorder.  Are very overweight (morbidly  obese).  Have anemia. This is a condition that affects your red blood cells. What are the signs or symptoms? Symptoms usually begin suddenly and last 4-14 days. They may include:  Fever and chills.  Headaches, body aches, or muscle aches.  Sore throat.  Cough.  Runny or stuffy (congested) nose.  Chest discomfort.  Not wanting to eat as much as normal (poor appetite).  Weakness or feeling tired (fatigue).  Dizziness.  Feeling sick to your stomach (nauseous) or throwing up (vomiting). How is this treated? If the flu is found early, you can be treated with medicine that can help reduce how bad the illness is and how long it lasts (antiviral medicine). This may be given by mouth (orally) or through an IV tube. Taking care of yourself at home can help your symptoms get better. Your doctor may suggest:  Taking over-the-counter medicines.  Drinking plenty of fluids. The flu often goes away on its own. If you have very bad symptoms or other problems, you may be treated in a hospital. Follow these instructions at home:     Activity  Rest as needed. Get plenty of sleep.  Stay home from work or school as told by your doctor. ? Do not leave home until you do not have a fever for 24 hours without taking medicine. ? Leave home only to visit your doctor. Eating and drinking  Take an ORS (oral rehydration solution). This is a drink that is  sold at pharmacies and stores.  Drink enough fluid to keep your pee (urine) pale yellow.  Drink clear fluids in small amounts as you are able. Clear fluids include: ? Water. ? Ice chips. ? Fruit juice that has water added (diluted fruit juice). ? Low-calorie sports drinks.  Eat bland, easy-to-digest foods in small amounts as you are able. These foods include: ? Bananas. ? Applesauce. ? Rice. ? Lean meats. ? Toast. ? Crackers.  Do not eat or drink: ? Fluids that have a lot of sugar or caffeine. ? Alcohol. ? Spicy or fatty  foods. General instructions  Take over-the-counter and prescription medicines only as told by your doctor.  Use a cool mist humidifier to add moisture to the air in your home. This can make it easier for you to breathe.  Cover your mouth and nose when you cough or sneeze.  Wash your hands with soap and water often, especially after you cough or sneeze. If you cannot use soap and water, use alcohol-based hand sanitizer.  Keep all follow-up visits as told by your doctor. This is important. How is this prevented?   Get a flu shot every year. You may get the flu shot in late summer, fall, or winter. Ask your doctor when you should get your flu shot.  Avoid contact with people who are sick during fall and winter (cold and flu season). Contact a doctor if:  You get new symptoms.  You have: ? Chest pain. ? Watery poop (diarrhea). ? A fever.  Your cough gets worse.  You start to have more mucus.  You feel sick to your stomach.  You throw up. Get help right away if you:  Have shortness of breath.  Have trouble breathing.  Have skin or nails that turn a bluish color.  Have very bad pain or stiffness in your neck.  Get a sudden headache.  Get sudden pain in your face or ear.  Cannot eat or drink without throwing up. Summary  Influenza ("the flu") is an infection in the lungs, nose, and throat. It is caused by a virus.  Take over-the-counter and prescription medicines only as told by your doctor.  Getting a flu shot every year is the best way to avoid getting the flu. This information is not intended to replace advice given to you by your health care provider. Make sure you discuss any questions you have with your health care provider. Document Released: 10/10/2007 Document Revised: 06/18/2017 Document Reviewed: 06/18/2017 Elsevier Interactive Patient Education  2019 [employer].

## 2018-03-09 NOTE — Progress Notes (Addendum)
Subjective:    Patient ID: Nicole Calhoun, female    DOB: 1955-10-17, 63 y.o.   MRN: [id number]  HPI This is a 63 yo female being see in the office today for flu exposure. Symptoms started Saturday afternoon, stuffy nose, clear drainage, performed sinus lavage rinse x1, no fever, + for body aches, chills, no n/v/d, no headaches,+ sinus pressure, right ear painful. Symptoms mgt. At home: Alka-Seltzer Cold Plus x1. Nothing taken this morning. . Past Medical History:  Diagnosis Date  . Arthritis    "all over" (07/04/2104)  . Chronic lower back pain   . Coronary artery disease    Mild  . Depression   . Dysrhythmia   . Fibromyalgia   . GERD (gastroesophageal reflux disease)   . Hard of hearing   . Hepatitis C    "I took the treatment and was cleared" (07/05/2014)  . History of hiatal hernia   . Myocardial infarction [facility])    "I was told I'd had a light heart attack one time; maybe 2001"  . Pancreatic cyst   . PONV (postoperative nausea and vomiting)   . Sleep apnea    "have mask; haven't worn it in years" (07/05/2014)   Past Surgical History:  Procedure Laterality Date  . CARDIOVASCULAR STRESS TEST  06/08/2009   No scintigraphic evidence of inducible myocardial ischemia.  Marland Kitchen CATARACT EXTRACTION W/ INTRAOCULAR LENS  IMPLANT, BILATERAL Bilateral   . CESAREAN SECTION  X 3  . FOOT SURGERY Left 04/28/2012   EXCISION OF PLANTAR FIBROMA , PLANTAR ASPECT LEFT ARCH  . GANGLION CYST EXCISION Bilateral    "2 on the right; 1 on the left"  . ORIF HUMERUS FRACTURE Right 07/05/2014   Procedure: OPEN REDUCTION INTERNAL FIXATION (ORIF) PROXIMAL HUMERUS FRACTURE;  Surgeon: Tania Ade, MD;  Location: [place];  Service: Orthopedics;  Laterality: Right;  Open reduction internal fixation right shoulder   . ORIF PROXIMAL HUMERUS FRACTURE Right 07/05/2014  . TRANSTHORACIC ECHOCARDIOGRAM  06/08/2009   EF >55%, normal LV systolic function   Family History  Problem Relation Age of Onset  . Arthritis  Mother   . Heart disease Mother 75  . Stroke Mother 27  . Cancer Father        Colon cancer  . Stroke Father 34   Social History   Tobacco Use  . Smoking status: Former Smoker    Packs/day: 0.50    Years: 8.00    Pack years: 4.00    Types: Cigarettes  . Smokeless tobacco: Never Used  . Tobacco comment: "quit smoking cigarettes in the 1980's"  Substance Use Topics  . Alcohol use: Yes    Comment: "used to drink when I was younger"  . Drug use: No       Review of Systems  Constitutional: Positive for chills and fatigue.  HENT: Positive for congestion, ear pain, postnasal drip, rhinorrhea, sinus pressure, sinus pain and sore throat.        Right ear pain  Respiratory: Positive for cough.   Cardiovascular: Negative.   Gastrointestinal: Negative.   Genitourinary: Negative.   Musculoskeletal: Positive for myalgias.  Skin: Negative.   Neurological: Negative.   Psychiatric/Behavioral: Negative.       per HPI Objective:   Physical Exam Vitals signs reviewed.  Constitutional:      Appearance: Normal appearance. She is ill-appearing.  HENT:     Head: Normocephalic and atraumatic.     Right Ear: Ear canal and external ear normal.  Ears:     Comments: Slight buldge to right TM    Nose: Congestion and rhinorrhea present.     Mouth/Throat:     Mouth: Mucous membranes are moist.     Pharynx: Posterior oropharyngeal erythema present.  Cardiovascular:     Rate and Rhythm: Normal rate and regular rhythm.     Heart sounds: Normal heart sounds.  Pulmonary:     Effort: Pulmonary effort is normal.     Breath sounds: Normal breath sounds.  Skin:    General: Skin is warm and dry.  Neurological:     Mental Status: She is alert and oriented to person, place, and time.  Psychiatric:        Attention and Perception: Attention normal.        Mood and Affect: Mood normal.        Behavior: Behavior normal. Behavior is cooperative.    BP 136/78 (BP Location: Left Arm, Patient  Position: Sitting)   Pulse 88   Temp 98.2 F (36.8 C) (Oral)   Resp 14   Ht 5' 0.25" (1.53 m)   Wt 140 lb (63.5 kg)   SpO2 98%   BMI 27.12 kg/m  BP Readings from Last 3 Encounters:  03/09/18 136/78  06/13/17 108/60  04/02/17 138/78   Wt Readings from Last 3 Encounters:  03/09/18 140 lb (63.5 kg)  06/13/17 137 lb (62.1 kg)  04/02/17 140 lb 8 oz (63.7 kg)    Results for orders placed or performed in visit on 03/09/18  POC Influenza A&B (Binax test)  Result Value Ref Range   Influenza A, POC Positive (A) Negative   Influenza B, POC Negative Negative         Assessment & Plan:  Exposure to the flu - Plan: POC Influenza A&B (Binax test)  Influenza A - Plan: oseltamivir (TAMIFLU) 75 MG capsule   Patient Instructions  Good to see you today  I have sent in Tamiflu to your pharmacy  For nasal congestion you can use Afrin nasal spray for 3 days max, saline nasal spray (generic is fine for all). For cough you can try Delsym. Drink enough fluids to make your urine light yellow. For fever/chill/muscle aches you can take over the counter acetaminophen or ibuprofen.  Please come back in if you are not better in 5-7 days or if you develop wheezing, shortness of breath or persistent vomiting.   Influenza, Adult Influenza is also called "the flu." It is an infection in the lungs, nose, and throat (respiratory tract). It is caused by a virus. The flu causes symptoms that are similar to symptoms of a cold. It also causes a high fever and body aches. The flu spreads easily from person to person (is contagious). Getting a flu shot (influenza vaccination) every year is the best way to prevent the flu. What are the causes? This condition is caused by the influenza virus. You can get the virus by:  Breathing in droplets that are in the air from the cough or sneeze of a person who has the virus.  Touching something that has the virus on it (is contaminated) and then touching your  mouth, nose, or eyes. What increases the risk? Certain things may make you more likely to get the flu. These include:  Not washing your hands often.  Having close contact with many people during cold and flu season.  Touching your mouth, eyes, or nose without first washing your hands.  Not getting a flu shot  every year. You may have a higher risk for the flu, along with serious problems such as a lung infection (pneumonia), if you:  Are older than 65.  Are pregnant.  Have a weakened disease-fighting system (immune system) because of a disease or taking certain medicines.  Have a long-term (chronic) illness, such as: ? Heart, kidney, or lung disease. ? Diabetes. ? Asthma.  Have a liver disorder.  Are very overweight (morbidly obese).  Have anemia. This is a condition that affects your red blood cells. What are the signs or symptoms? Symptoms usually begin suddenly and last 4-14 days. They may include:  Fever and chills.  Headaches, body aches, or muscle aches.  Sore throat.  Cough.  Runny or stuffy (congested) nose.  Chest discomfort.  Not wanting to eat as much as normal (poor appetite).  Weakness or feeling tired (fatigue).  Dizziness.  Feeling sick to your stomach (nauseous) or throwing up (vomiting). How is this treated? If the flu is found early, you can be treated with medicine that can help reduce how bad the illness is and how long it lasts (antiviral medicine). This may be given by mouth (orally) or through an IV tube. Taking care of yourself at home can help your symptoms get better. Your doctor may suggest:  Taking over-the-counter medicines.  Drinking plenty of fluids. The flu often goes away on its own. If you have very bad symptoms or other problems, you may be treated in a hospital. Follow these instructions at home:     Activity  Rest as needed. Get plenty of sleep.  Stay home from work or school as told by your doctor. ? Do not leave  home until you do not have a fever for 24 hours without taking medicine. ? Leave home only to visit your doctor. Eating and drinking  Take an ORS (oral rehydration solution). This is a drink that is sold at pharmacies and stores.  Drink enough fluid to keep your pee (urine) pale yellow.  Drink clear fluids in small amounts as you are able. Clear fluids include: ? Water. ? Ice chips. ? Fruit juice that has water added (diluted fruit juice). ? Low-calorie sports drinks.  Eat bland, easy-to-digest foods in small amounts as you are able. These foods include: ? Bananas. ? Applesauce. ? Rice. ? Lean meats. ? Toast. ? Crackers.  Do not eat or drink: ? Fluids that have a lot of sugar or caffeine. ? Alcohol. ? Spicy or fatty foods. General instructions  Take over-the-counter and prescription medicines only as told by your doctor.  Use a cool mist humidifier to add moisture to the air in your home. This can make it easier for you to breathe.  Cover your mouth and nose when you cough or sneeze.  Wash your hands with soap and water often, especially after you cough or sneeze. If you cannot use soap and water, use alcohol-based hand sanitizer.  Keep all follow-up visits as told by your doctor. This is important. How is this prevented?   Get a flu shot every year. You may get the flu shot in late summer, fall, or winter. Ask your doctor when you should get your flu shot.  Avoid contact with people who are sick during fall and winter (cold and flu season). Contact a doctor if:  You get new symptoms.  You have: ? Chest pain. ? Watery poop (diarrhea). ? A fever.  Your cough gets worse.  You start to have more mucus.  You feel sick to your stomach.  You throw up. Get help right away if you:  Have shortness of breath.  Have trouble breathing.  Have skin or nails that turn a bluish color.  Have very bad pain or stiffness in your neck.  Get a sudden headache.  Get  sudden pain in your face or ear.  Cannot eat or drink without throwing up. Summary  Influenza ("the flu") is an infection in the lungs, nose, and throat. It is caused by a virus.  Take over-the-counter and prescription medicines only as told by your doctor.  Getting a flu shot every year is the best way to avoid getting the flu. This information is not intended to replace advice given to you by your health care provider. Make sure you discuss any questions you have with your health care provider. Document Released: 10/10/2007 Document Revised: 06/18/2017 Document Reviewed: 06/18/2017 Elsevier Interactive Patient Education  2019 [employer].

## 2018-03-09 NOTE — Progress Notes (Signed)
Subjective:    Patient ID: Nicole Calhoun, female    DOB: 16-Oct-1955, 63 y.o.   MRN: [id number]  HPI This is a 63 yo female who presents today with flu like symptoms x 2 days. Body aches, dry cough, no wheeze or SOB, some ear pain. Has taken 1 dose of Alka seltzer with little relief. Saline lavage caused pain in right ear. Little nasal drainage, feels congested. She requests Tamiflu.   Past Medical History:  Diagnosis Date  . Arthritis    "all over" (07/04/2104)  . Chronic lower back pain   . Coronary artery disease    Mild  . Depression   . Dysrhythmia   . Fibromyalgia   . GERD (gastroesophageal reflux disease)   . Hard of hearing   . Hepatitis C    "I took the treatment and was cleared" (07/05/2014)  . History of hiatal hernia   . Myocardial infarction [facility])    "I was told I'd had a light heart attack one time; maybe 2001"  . Pancreatic cyst   . PONV (postoperative nausea and vomiting)   . Sleep apnea    "have mask; haven't worn it in years" (07/05/2014)   Past Surgical History:  Procedure Laterality Date  . CARDIOVASCULAR STRESS TEST  06/08/2009   No scintigraphic evidence of inducible myocardial ischemia.  Marland Kitchen CATARACT EXTRACTION W/ INTRAOCULAR LENS  IMPLANT, BILATERAL Bilateral   . CESAREAN SECTION  X 3  . FOOT SURGERY Left 04/28/2012   EXCISION OF PLANTAR FIBROMA , PLANTAR ASPECT LEFT ARCH  . GANGLION CYST EXCISION Bilateral    "2 on the right; 1 on the left"  . ORIF HUMERUS FRACTURE Right 07/05/2014   Procedure: OPEN REDUCTION INTERNAL FIXATION (ORIF) PROXIMAL HUMERUS FRACTURE;  Surgeon: Tania Ade, MD;  Location: [place];  Service: Orthopedics;  Laterality: Right;  Open reduction internal fixation right shoulder   . ORIF PROXIMAL HUMERUS FRACTURE Right 07/05/2014  . TRANSTHORACIC ECHOCARDIOGRAM  06/08/2009   EF >55%, normal LV systolic function   Family History  Problem Relation Age of Onset  . Arthritis Mother   . Heart disease Mother 63  . Stroke Mother 60    . Cancer Father        Colon cancer  . Stroke Father 51   Social History   Tobacco Use  . Smoking status: Former Smoker    Packs/day: 0.50    Years: 8.00    Pack years: 4.00    Types: Cigarettes  . Smokeless tobacco: Never Used  . Tobacco comment: "quit smoking cigarettes in the 1980's"  Substance Use Topics  . Alcohol use: Yes    Comment: "used to drink when I was younger"  . Drug use: No      Review of Systems Per HPI    Objective:   Physical Exam Vitals signs reviewed.  Constitutional:      General: She is not in acute distress.    Appearance: Normal appearance. She is normal weight. She is not ill-appearing or toxic-appearing.  HENT:     Head: Normocephalic and atraumatic.     Right Ear: Ear canal and external ear normal.     Left Ear: Tympanic membrane, ear canal and external ear normal.     Ears:     Comments: Left TM dull.     Nose: Congestion and rhinorrhea present.     Mouth/Throat:     Mouth: Mucous membranes are moist.     Pharynx: No oropharyngeal exudate or  posterior oropharyngeal erythema.     Comments: Visible post nasal drainage.  Eyes:     Conjunctiva/sclera: Conjunctivae normal.  Neck:     Musculoskeletal: Normal range of motion. No neck rigidity or muscular tenderness.  Cardiovascular:     Rate and Rhythm: Normal rate and regular rhythm.     Heart sounds: Normal heart sounds.  Pulmonary:     Effort: Pulmonary effort is normal.     Breath sounds: Normal breath sounds.  Lymphadenopathy:     Cervical: No cervical adenopathy.  Skin:    General: Skin is warm and dry.  Neurological:     Mental Status: She is alert and oriented to person, place, and time.  Psychiatric:        Mood and Affect: Mood normal.        Behavior: Behavior normal.        Thought Content: Thought content normal.        Judgment: Judgment normal.       BP 136/78 (BP Location: Left Arm, Patient Position: Sitting)   Pulse 88   Temp 98.2 F (36.8 C) (Oral)    Resp 14   Ht 5' 0.25" (1.53 m)   Wt 140 lb (63.5 kg)   SpO2 98%   BMI 27.12 kg/m  Wt Readings from Last 3 Encounters:  03/09/18 140 lb (63.5 kg)  06/13/17 137 lb (62.1 kg)  04/02/17 140 lb 8 oz (63.7 kg)   Results for orders placed or performed in visit on 03/09/18  POC Influenza A&B (Binax test)  Result Value Ref Range   Influenza A, POC Positive (A) Negative   Influenza B, POC Negative Negative       Assessment & Plan:  1. Exposure to the flu - POC Influenza A&B (Binax test)  2. Influenza A - Provided written and verbal information regarding diagnosis and treatment. - RTC precautions reviewed - symptomatic measures reviewed and encouraged her to rest and drink adequate fluids.  - oseltamivir (TAMIFLU) 75 MG capsule; Take 1 capsule (75 mg total) by mouth 2 (two) times daily.  Dispense: 10 capsule; Refill: 0   Clarene Reamer, FNP-BC  [facility], [facility]  03/09/2018 9:47 AM

## 2018-03-13 ENCOUNTER — Ambulatory Visit
Admission: RE | Admit: 2018-03-13 | Discharge: 2018-03-13 | Disposition: A | Discharge status: Home / Self Care | Payer: Commercial Managed Care - PPO | Source: Ambulatory Visit | Attending: Family Medicine | Admitting: Family Medicine

## 2018-03-13 DIAGNOSIS — E2839 Other primary ovarian failure: Secondary | ICD-10-CM

## 2018-03-15 ENCOUNTER — Encounter: Payer: Self-pay | Admitting: Family Medicine

## 2018-03-18 ENCOUNTER — Telehealth: Payer: Self-pay | Admitting: *Deleted

## 2018-03-18 NOTE — Telephone Encounter (Signed)
Left message on voicemail for patient to call back. See result note and schedule appointment as instructed.

## 2018-03-18 NOTE — Telephone Encounter (Signed)
Pt returned your call.  She didn't want to make appointment until she got the results from bone density

## 2018-03-18 NOTE — Telephone Encounter (Signed)
Called patient back and results were given and appointment scheduled.

## 2018-03-23 ENCOUNTER — Telehealth: Payer: Self-pay

## 2018-03-23 ENCOUNTER — Ambulatory Visit: Payer: Self-pay | Admitting: Family Medicine

## 2018-03-23 NOTE — Telephone Encounter (Signed)
Appt is  rescheduled.

## 2018-03-23 NOTE — Telephone Encounter (Signed)
[place] Night - Client Nonclinical Telephone Record [facility] Night - Client Client Site [place] Physician Tor Netters- NP Contact Type Call Who Is Calling Patient / Member / Family / Caregiver Caller Name Chelyan Phone Number [phone] Patient Name Nicole Calhoun Patient DOB [birth year] Call Type Message Only Information Provided Reason for Call Request to Reschedule Office Appointment Initial Comment Caller states she needs to reschedule her 215p appointment. Additional Comment Please call as soon as possible. Call Closed By: [place] [facility] Date/Time: 03/23/2018 7:45:32 AM (ET)

## 2018-04-01 ENCOUNTER — Encounter: Payer: Self-pay | Admitting: Family Medicine

## 2018-04-01 ENCOUNTER — Other Ambulatory Visit: Payer: Self-pay

## 2018-04-01 ENCOUNTER — Ambulatory Visit: Payer: Commercial Managed Care - PPO | Admitting: Family Medicine

## 2018-04-01 ENCOUNTER — Ambulatory Visit: Payer: Self-pay | Admitting: Family Medicine

## 2018-04-01 VITALS — BP 118/72 | HR 80 | Temp 97.8°F | Ht 60.0 in | Wt 140.0 lb

## 2018-04-01 DIAGNOSIS — M810 Age-related osteoporosis without current pathological fracture: Secondary | ICD-10-CM | POA: Diagnosis not present

## 2018-04-01 DIAGNOSIS — I7300 Raynaud's syndrome without gangrene: Secondary | ICD-10-CM

## 2018-04-01 LAB — COMPREHENSIVE METABOLIC PANEL
ALT: 15 U/L (ref 0–35)
AST: 23 U/L (ref 0–37)
Albumin: 4.3 g/dL (ref 3.5–5.2)
Alkaline Phosphatase: 59 U/L (ref 39–117)
BUN: 13 mg/dL (ref 6–23)
CO2: 32 mEq/L (ref 19–32)
Calcium: 9.3 mg/dL (ref 8.4–10.5)
Chloride: 100 mEq/L (ref 96–112)
Creatinine, Ser: 0.66 mg/dL (ref 0.40–1.20)
GFR: 90.68 mL/min (ref >60.00)
Glucose, Bld: 96 mg/dL (ref 70–99)
Potassium: 4.4 mEq/L (ref 3.5–5.1)
Sodium: 138 mEq/L (ref 135–145)
Total Bilirubin: 0.6 mg/dL (ref 0.2–1.2)
Total Protein: 7.4 g/dL (ref 6.0–8.3)

## 2018-04-01 LAB — CBC WITH DIFFERENTIAL/PLATELET
Basophils Absolute: 0 10*3/uL (ref 0.0–0.1)
Basophils Relative: 0.6 % (ref 0.0–3.0)
Eosinophils Absolute: 0.1 10*3/uL (ref 0.0–0.7)
Eosinophils Relative: 1.2 % (ref 0.0–5.0)
HCT: 39.8 % (ref 36.0–46.0)
Hemoglobin: 13.5 g/dL (ref 12.0–15.0)
Lymphocytes Relative: 36.8 % (ref 12.0–46.0)
Lymphs Abs: 1.9 10*3/uL (ref 0.7–4.0)
MCHC: 33.9 g/dL (ref 30.0–36.0)
MCV: 91.3 fl (ref 78.0–100.0)
Monocytes Absolute: 0.4 10*3/uL (ref 0.1–1.0)
Monocytes Relative: 7.7 % (ref 3.0–12.0)
Neutro Abs: 2.8 10*3/uL (ref 1.4–7.7)
Neutrophils Relative %: 53.7 % (ref 43.0–77.0)
Platelets: 233 10*3/uL (ref 150.0–400.0)
RBC: 4.37 Mil/uL (ref 3.87–5.11)
RDW: 13 % (ref 11.5–15.5)
WBC: 5.2 10*3/uL (ref 4.0–10.5)

## 2018-04-01 LAB — VITAMIN D 25 HYDROXY (VIT D DEFICIENCY, FRACTURES): VITD: 27.92 ng/mL — ABNORMAL LOW (ref 30.00–100.00)

## 2018-04-01 NOTE — Progress Notes (Signed)
Subjective:    Patient ID: Nicole Calhoun, female    DOB: 10-15-1955, 63 y.o.   MRN: [id number]  HPI This is a 63 yo female who present today to discuss recent bone density study results. She has testing on 03/13/2018 which showed T score of - 2.9 with diagnosis of osteoporosis. She reports that she was previously on Boniva.   She has been having more problems with her back, started about 4 years ago. Seeing Dr. Roland Rack. Has had PT. She is walking at least 11,000 steps daily, seeing chiropractor, stretching, heat and ice. Trying to get disability.   Thinks she has Raynaud's, having some paleness and numbness in tips of middle fingers bilaterally. It is worse during the cold. Not bothering her as much with weather getting warmer.   Past Medical History:  Diagnosis Date  . Arthritis    "all over" (07/04/2104)  . Chronic lower back pain   . Coronary artery disease    Mild  . Depression   . Dysrhythmia   . Fibromyalgia   . GERD (gastroesophageal reflux disease)   . Hard of hearing   . Hepatitis C    "I took the treatment and was cleared" (07/05/2014)  . History of hiatal hernia   . Myocardial infarction [facility])    "I was told I'd had a light heart attack one time; maybe 2001"  . Pancreatic cyst   . PONV (postoperative nausea and vomiting)   . Sleep apnea    "have mask; haven't worn it in years" (07/05/2014)   Past Surgical History:  Procedure Laterality Date  . CARDIOVASCULAR STRESS TEST  06/08/2009   No scintigraphic evidence of inducible myocardial ischemia.  Marland Kitchen CATARACT EXTRACTION W/ INTRAOCULAR LENS  IMPLANT, BILATERAL Bilateral   . CESAREAN SECTION  X 3  . FOOT SURGERY Left 04/28/2012   EXCISION OF PLANTAR FIBROMA , PLANTAR ASPECT LEFT ARCH  . GANGLION CYST EXCISION Bilateral    "2 on the right; 1 on the left"  . ORIF HUMERUS FRACTURE Right 07/05/2014   Procedure: OPEN REDUCTION INTERNAL FIXATION (ORIF) PROXIMAL HUMERUS FRACTURE;  Surgeon: Tania Ade, MD;  Location: [place];   Service: Orthopedics;  Laterality: Right;  Open reduction internal fixation right shoulder   . ORIF PROXIMAL HUMERUS FRACTURE Right 07/05/2014  . TRANSTHORACIC ECHOCARDIOGRAM  06/08/2009   EF >55%, normal LV systolic function   Family History  Problem Relation Age of Onset  . Arthritis Mother   . Heart disease Mother 50  . Stroke Mother 46  . Cancer Father        Colon cancer  . Stroke Father 48   Social History   Tobacco Use  . Smoking status: Former Smoker    Packs/day: 0.50    Years: 8.00    Pack years: 4.00    Types: Cigarettes  . Smokeless tobacco: Never Used  . Tobacco comment: "quit smoking cigarettes in the 1980's"  Substance Use Topics  . Alcohol use: Yes    Comment: "used to drink when I was younger"  . Drug use: No      Review of Systems Per HPI    Objective:   Physical Exam Vitals signs reviewed.  Constitutional:      General: She is not in acute distress.    Appearance: Normal appearance. She is normal weight. She is not ill-appearing or diaphoretic.  Cardiovascular:     Rate and Rhythm: Normal rate.  Pulmonary:     Effort: Pulmonary effort is normal.  Neurological:     Mental Status: She is alert and oriented to person, place, and time.  Psychiatric:        Mood and Affect: Mood normal.        Behavior: Behavior normal.        Thought Content: Thought content normal.        Judgment: Judgment normal.       BP 118/72   Pulse 80   Temp 97.8 F (36.6 C)   Ht 5' (1.524 m)   Wt 140 lb (63.5 kg)   SpO2 97%   BMI 27.34 kg/m      Wt Readings from Last 3 Encounters:  04/01/18 140 lb (63.5 kg)  03/09/18 140 lb (63.5 kg)  06/13/17 137 lb (62.1 kg)    Assessment & Plan:  1. Age-related osteoporosis without current pathological fracture - discussed restarting bisphosphonate and provided information about alendronate  - she also wishes to discuss with Dr. Roland Rack - CBC with Differential - Comprehensive metabolic panel - VITAMIN D 25 Hydroxy  (Vit-D Deficiency, Fractures)  - follow up on file 6/20 for CPE Clarene Reamer, FNP-BC  [facility], [place]  04/01/2018 11:08 AM

## 2018-04-01 NOTE — Patient Instructions (Addendum)
I will notify you of lab results Consider alendronate for your osteoporosis  Raynaud Phenomenon  Raynaud phenomenon is a condition that affects the blood vessels (arteries) that carry blood to your fingers and toes. The arteries that supply blood to your ears, lips, nipples, or the tip of your nose might also be affected. Raynaud phenomenon causes the arteries to become narrow temporarily (spasm). As a result, the flow of blood to the affected areas is temporarily decreased. This usually occurs in response to cold temperatures or stress. During an attack, the skin in the affected areas turns white, then blue, and finally red. You may also feel tingling or numbness in those areas. Attacks usually last for only a brief period, and then the blood flow to the area returns to normal. In most cases, Raynaud phenomenon does not cause serious health problems. What are the causes? In many cases, the cause of this condition is not known. The condition may occur on its own (primary Raynaud phenomenon) or may be associated with other diseases or factors (secondary Raynaud phenomenon). Possible causes may include:  Diseases or medical conditions that damage the arteries.  Injuries and repetitive actions that hurt the hands or feet.  Being exposed to certain chemicals.  Taking medicines that narrow the arteries.  Other medical conditions, such as lupus, scleroderma, rheumatoid arthritis, thyroid problems, blood disorders, Sjogren syndrome, or atherosclerosis. What increases the risk? The following factors may make you more likely to develop this condition:  Being 37-56 years old.  Being female.  Having a family history of Raynaud phenomenon.  Living in a cold climate.  Smoking. What are the signs or symptoms? Symptoms of this condition usually occur when you are exposed to cold temperatures or when you have emotional stress. The symptoms may last for a few minutes or up to several hours. They  usually affect your fingers but may also affect your toes, nipples, lips, ears, or the tip of your nose. Symptoms may include:  Changes in skin color. The skin in the affected areas will turn pale or white. The skin may then change from white to bluish to red as normal blood flow returns to the area.  Numbness, tingling, or pain in the affected areas. In severe cases, symptoms may include:  Skin sores.  Tissues decaying and dying (gangrene). How is this diagnosed? This condition may be diagnosed based on:  Your symptoms and medical history.  A physical exam. During the exam, you may be asked to put your hands in cold water to check for a reaction to cold temperature.  Tests, such as: ? Blood tests to check for other diseases or conditions. ? A test to check the movement of blood through your arteries and veins (vascular ultrasound). ? A test in which the skin at the base of your fingernail is examined under a microscope (nailfold capillaroscopy). How is this treated? Treatment for this condition often involves making lifestyle changes and taking steps to control your exposure to cold temperatures. For more severe cases, medicine (calcium channel blockers) may be used to improve blood flow. Surgery is sometimes done to block the nerves that control the affected arteries, but this is rare. Follow these instructions at home: Avoiding cold temperatures Take these steps to avoid exposure to cold:  If possible, stay indoors during cold weather.  When you go outside during cold weather, dress in layers and wear mittens, a hat, a scarf, and warm footwear.  Wear mittens or gloves when handling ice or  frozen food.  Use holders for glasses or cans containing cold drinks.  Let warm water run for a while before taking a shower or bath.  Warm up the car before driving in cold weather. Lifestyle   If possible, avoid stressful and emotional situations. Try to find ways to manage your stress,  such as: ? Exercise. ? Yoga. ? Meditation. ? Biofeedback.  Do not use any products that contain nicotine or tobacco, such as cigarettes and e-cigarettes. If you need help quitting, ask your health care provider.  Avoid secondhand smoke.  Limit your use of caffeine. ? Switch to decaffeinated coffee, tea, and soda. ? Avoid chocolate.  Avoid vibrating tools and machinery. General instructions  Protect your hands and feet from injuries, cuts, or bruises.  Avoid wearing tight rings or wristbands.  Wear loose fitting socks and comfortable, roomy shoes.  Take over-the-counter and prescription medicines only as told by your health care provider. Contact a health care provider if:  Your discomfort becomes worse despite lifestyle changes.  You develop sores on your fingers or toes that do not heal.  Your fingers or toes turn black.  You have breaks in the skin on your fingers or toes.  You have a fever.  You have pain or swelling in your joints.  You have a rash.  Your symptoms occur on only one side of your body. Summary  Raynaud phenomenon is a condition that affects the arteries that carry blood to your fingers, toes, ears, lips, nipples, or the tip of your nose.  In many cases, the cause of this condition is not known.  Symptoms of this condition include changes in skin color, and numbness and tingling of the affected area.  Treatment for this condition includes lifestyle changes, reducing exposure to cold temperatures, and using medicines for severe cases of the condition.  Contact your health care provider if your condition worsens despite treatment. This information is not intended to replace advice given to you by your health care provider. Make sure you discuss any questions you have with your health care provider. Document Released: 12/29/1999 Document Revised: 07/16/2016 Document Reviewed: 02/12/2016 Elsevier Interactive Patient Education  2019 Elsevier  Inc. Alendronate oral solution What is this medicine? ALENDRONATE (a LEN droe nate) slows calcium loss from bones. It helps to make normal healthy bone and to slow bone loss in people with Paget's disease and osteoporosis. It may be used in others at risk for bone loss. This medicine may be used for other purposes; ask your health care provider or pharmacist if you have questions. COMMON BRAND NAME(S): Fosamax What should I tell my health care provider before I take this medicine? They need to know if you have any of these conditions: -esophagus, stomach, or intestine problems, like acid reflux or GERD -dental disease -kidney disease -low blood calcium -low vitamin D -problems swallowing -problems sitting or standing for 30 minutes -an unusual or allergic reaction to alendronate, other medicines, foods, dyes, or preservatives -pregnant or trying to get pregnant -breast-feeding How should I use this medicine? You must take this medicine exactly as directed or you will lower the amount of the medicine you absorb into your body or you may cause yourself harm. Take your dose by mouth first thing in the morning, after you are up for the day. Do not eat or drink anything before you take this solution. Use a specially marked spoon or container to measure the oral solution. Take the solution with 2 fluid ounces (1/4  cup) of plain water. Do not take the solution with any other drink. After taking this medicine do not eat breakfast, drink, or take any other medicines or vitamins for at least 30 minutes. Stand or sit up for at least 30 minutes after you take this medicine; do not lie down. Do not take your medicine more often than directed. Take this medicine on the same day every week. Talk to your pediatrician regarding the use of this medicine in children. Special care may be needed. Overdosage: If you think you have taken too much of this medicine contact a poison control center or emergency room at  once. NOTE: This medicine is only for you. Do not share this medicine with others. What if I miss a dose? If you miss a dose, take the dose on the morning after you remember. Then take your next dose on your regular day of the week. Never take 2 doses on the same day. Do not take double or extra doses. What may interact with this medicine? -aluminum hydroxide -antacids -aspirin -calcium supplements -drugs for inflammation like ibuprofen, naproxen, and others -iron supplements -magnesium supplements -vitamins with minerals This list may not describe all possible interactions. Give your health care provider a list of all the medicines, herbs, non-prescription drugs, or dietary supplements you use. Also tell them if you smoke, drink alcohol, or use illegal drugs. Some items may interact with your medicine. What should I watch for while using this medicine? Visit your doctor for regular check ups. It may be some time before you see benefit from this medicine. Do not stop taking your medicine unless your doctor tells you to. Your doctor or health care professional may order regular blood tests to see how you are doing. You should make sure you get enough calcium and vitamin D in your diet while you are taking this medicine, unless your doctor tells you not to. Discuss the foods you eat and the vitamins you take with your health care professional. Some people who take this medicine have severe bone, joint, and/or muscle pain. This medicine may also increase your risk for a broken thigh bone. Tell your doctor right away if you have pain in your upper leg or groin. Tell your doctor if you have any pain that does not go away or that gets worse. This medicine can make you more sensitive to the sun. If you get a rash while taking this medicine, sunlight may cause the rash to get worse. Keep out of the sun. If you cannot avoid being in the sun, wear protective clothing and use sunscreen. Do not use sun lamps  or tanning beds/booths. What side effects may I notice from receiving this medicine? Side effects that you should report to your doctor or health care professional as soon as possible: -allergic reactions like skin rash, itching or hives, swelling of the face, lips, or tongue -black or tarry stools -bone, muscle, or joint pain -changes in vision -chest pain -heartburn or stomach pain -jaw pain, especially after dental work -redness, blistering, peeling or loosening of the skin, including inside the mouth -trouble or pain when swallowing Side effects that usually do not require medical attention (report to your doctor or health care professional if they continue or are bothersome): -changes in taste -diarrhea or constipation -eye pain or itching -headache -nausea or vomiting -stomach gas or fullness This list may not describe all possible side effects. Call your doctor for medical advice about side effects. You may report  side effects to FDA at 1-800-FDA-1088. Where should I keep my medicine? Keep out of the reach of children. Store at room temperature of 15 and 30 degrees C (59 and 86 degrees F). Throw away any unused medicine after the expiration date. NOTE: This sheet is a summary. It may not cover all possible information. If you have questions about this medicine, talk to your doctor, pharmacist, or health care provider.  2019 Elsevier/Gold Standard (2010-06-29 08:54:51)

## 2018-04-02 ENCOUNTER — Telehealth: Payer: Self-pay

## 2018-04-02 ENCOUNTER — Other Ambulatory Visit: Payer: Self-pay | Admitting: Family Medicine

## 2018-04-02 DIAGNOSIS — M810 Age-related osteoporosis without current pathological fracture: Secondary | ICD-10-CM

## 2018-04-02 MED ORDER — ALENDRONATE SODIUM 70 MG PO TABS: 70.0000 mg | ORAL_TABLET | ORAL | 3 refills | Status: DC

## 2018-04-02 NOTE — Telephone Encounter (Signed)
Pt called given lab results  calcium, kidney function, liver function, blood counts and electrolytes are normal. Her vitamin D level is a little low. I want her to take vitamin D3, 1,000 IU daily. Pt agreed to plan to get and take Vit D

## 2018-04-02 NOTE — Telephone Encounter (Signed)
Pt left a message on triage line that she does want to take the Fosamax. Asked that it be called in to  [facility].

## 2018-04-02 NOTE — Telephone Encounter (Signed)
Medication sent to patient's pharmacy.

## 2018-04-15 ENCOUNTER — Telehealth: Payer: Self-pay | Admitting: Family Medicine

## 2018-04-15 NOTE — Telephone Encounter (Signed)
Pt called to request the bone density report from 2/28 at [facility] and 3/18 office visit AVS mailed to her house. Please call with any questions- said sheneeds this for LT disability request. [place] [id number]

## 2018-04-16 NOTE — Telephone Encounter (Signed)
Information mailed to pt as requested.

## 2018-06-08 ENCOUNTER — Other Ambulatory Visit: Payer: Self-pay | Admitting: Family Medicine

## 2018-06-08 DIAGNOSIS — E7800 Pure hypercholesterolemia, unspecified: Secondary | ICD-10-CM

## 2018-06-08 NOTE — Progress Notes (Signed)
lipili[

## 2018-06-10 ENCOUNTER — Other Ambulatory Visit (INDEPENDENT_AMBULATORY_CARE_PROVIDER_SITE_OTHER): Payer: Self-pay

## 2018-06-10 DIAGNOSIS — E7800 Pure hypercholesterolemia, unspecified: Secondary | ICD-10-CM

## 2018-06-10 LAB — LIPID PANEL
Cholesterol: 192 mg/dL (ref 0–200)
HDL: 60.9 mg/dL (ref >39.00)
LDL Cholesterol: 117 mg/dL — ABNORMAL HIGH (ref 0–99)
NonHDL: 130.71
Total CHOL/HDL Ratio: 3
Triglycerides: 67 mg/dL (ref 0.0–149.0)
VLDL: 13.4 mg/dL (ref 0.0–40.0)

## 2018-06-17 ENCOUNTER — Encounter: Payer: Self-pay | Admitting: Family Medicine

## 2018-07-20 ENCOUNTER — Other Ambulatory Visit: Payer: Self-pay | Admitting: Family Medicine

## 2018-07-20 DIAGNOSIS — F329 Major depressive disorder, single episode, unspecified: Secondary | ICD-10-CM

## 2018-07-20 DIAGNOSIS — F32A Depression, unspecified: Secondary | ICD-10-CM

## 2018-07-31 ENCOUNTER — Other Ambulatory Visit: Payer: Self-pay

## 2018-07-31 ENCOUNTER — Ambulatory Visit (INDEPENDENT_AMBULATORY_CARE_PROVIDER_SITE_OTHER): Payer: No Typology Code available for payment source | Admitting: Family Medicine

## 2018-07-31 ENCOUNTER — Other Ambulatory Visit (HOSPITAL_COMMUNITY)
Admission: RE | Admit: 2018-07-31 | Discharge: 2018-07-31 | Disposition: A | Discharge status: Home / Self Care | Payer: No Typology Code available for payment source | Source: Ambulatory Visit | Attending: Family Medicine | Admitting: Family Medicine

## 2018-07-31 VITALS — BP 130/62 | HR 87 | Temp 98.6°F | Resp 18 | Ht 60.5 in | Wt 139.5 lb

## 2018-07-31 DIAGNOSIS — N952 Postmenopausal atrophic vaginitis: Secondary | ICD-10-CM | POA: Diagnosis not present

## 2018-07-31 DIAGNOSIS — Z0000 Encounter for general adult medical examination without abnormal findings: Secondary | ICD-10-CM

## 2018-07-31 DIAGNOSIS — Z23 Encounter for immunization: Secondary | ICD-10-CM

## 2018-07-31 DIAGNOSIS — R300 Dysuria: Secondary | ICD-10-CM | POA: Diagnosis not present

## 2018-07-31 DIAGNOSIS — Z0001 Encounter for general adult medical examination with abnormal findings: Secondary | ICD-10-CM

## 2018-07-31 DIAGNOSIS — Z124 Encounter for screening for malignant neoplasm of cervix: Secondary | ICD-10-CM | POA: Insufficient documentation

## 2018-07-31 DIAGNOSIS — N393 Stress incontinence (female) (male): Secondary | ICD-10-CM

## 2018-07-31 LAB — POCT URINALYSIS DIPSTICK
Bilirubin, UA: NEGATIVE
Blood, UA: NEGATIVE
Glucose, UA: NEGATIVE
Ketones, UA: NEGATIVE
Leukocytes, UA: NEGATIVE
Nitrite, UA: NEGATIVE
Protein, UA: NEGATIVE
Spec Grav, UA: 1.015 (ref 1.010–1.025)
Urobilinogen, UA: 0.2 E.U./dL
pH, UA: 7 (ref 5.0–8.0)

## 2018-07-31 MED ORDER — ESTROGENS, CONJUGATED 0.625 MG/GM VA CREA: 1.0000 | TOPICAL_CREAM | VAGINAL | 3 refills | Status: DC

## 2018-07-31 NOTE — Progress Notes (Signed)
Subjective:    Patient ID: Nicole Calhoun, female    DOB: Dec 11, 1955, 63 y.o.   MRN: [id number]  HPI This is a 63 yo female who presents today for CPE.   Last CPE- 06/13/2017 Mammo- 01/27/18 Pap- 03/17/2014 Colonoscopy- 07/31/2017 Tdap- greater than 10 years per patient, will have today Flu- annual Exercise- walks which helps back  Chronic back pain- trying to get disability, doing exercises, taking vitamins. Takes ibuprofen 800 mg and flexeril.   Vitamin D deficient/osteoporosis- doesn't consume much dairy. Dexa showed T score of -2.9, we discussed starting alendronate but the patient was concerned about potential side effects and never started the medication.   Urinary incontinence- she has noticed a little bit of urinary leakage with strenuous activity, coughing, sneezing.  Intercourse is painful for her.  She has a little irritation intermittently with urinating.  Past Medical History:  Diagnosis Date  . Arthritis    "all over" (07/04/2104)  . Chronic lower back pain   . Coronary artery disease    Mild  . Depression   . Dysrhythmia   . Fibromyalgia   . GERD (gastroesophageal reflux disease)   . Hard of hearing   . Hepatitis C    "I took the treatment and was cleared" (07/05/2014)  . History of hiatal hernia   . Myocardial infarction [facility])    "I was told I'd had a light heart attack one time; maybe 2001"  . Pancreatic cyst   . PONV (postoperative nausea and vomiting)   . Sleep apnea    "have mask; haven't worn it in years" (07/05/2014)   Past Surgical History:  Procedure Laterality Date  . CARDIOVASCULAR STRESS TEST  06/08/2009   No scintigraphic evidence of inducible myocardial ischemia.  Marland Kitchen CATARACT EXTRACTION W/ INTRAOCULAR LENS  IMPLANT, BILATERAL Bilateral   . CESAREAN SECTION  X 3  . FOOT SURGERY Left 04/28/2012   EXCISION OF PLANTAR FIBROMA , PLANTAR ASPECT LEFT ARCH  . GANGLION CYST EXCISION Bilateral    "2 on the right; 1 on the left"  . ORIF HUMERUS  FRACTURE Right 07/05/2014   Procedure: OPEN REDUCTION INTERNAL FIXATION (ORIF) PROXIMAL HUMERUS FRACTURE;  Surgeon: Tania Ade, MD;  Location: [facility];  Service: Orthopedics;  Laterality: Right;  Open reduction internal fixation right shoulder   . ORIF PROXIMAL HUMERUS FRACTURE Right 07/05/2014  . TRANSTHORACIC ECHOCARDIOGRAM  06/08/2009   EF >55%, normal LV systolic function   Family History  Problem Relation Age of Onset  . Arthritis Mother   . Heart disease Mother 49  . Stroke Mother 49  . Cancer Father        Colon cancer  . Stroke Father 74   Social History   Tobacco Use  . Smoking status: Former Smoker    Packs/day: 0.50    Years: 8.00    Pack years: 4.00    Types: Cigarettes  . Smokeless tobacco: Never Used  . Tobacco comment: "quit smoking cigarettes in the 1980's"  Substance Use Topics  . Alcohol use: Yes    Comment: "used to drink when I was younger"  . Drug use: No      Review of Systems  Constitutional: Negative.   HENT: Negative.   Eyes: Negative.   Respiratory: Negative.   Cardiovascular: Negative.   Gastrointestinal: Negative.   Genitourinary: Positive for dysuria and vaginal pain (with intercourse). Negative for vaginal bleeding and vaginal discharge.  Musculoskeletal: Positive for back pain (chronic).  Skin: Negative.   Allergic/Immunologic: Negative.  Neurological: Negative.   Hematological: Negative.   Psychiatric/Behavioral: Negative.        Objective:   Physical Exam Vitals signs reviewed. Exam conducted with a chaperone present.  Constitutional:      General: She is not in acute distress.    Appearance: Normal appearance. She is normal weight. She is not ill-appearing, toxic-appearing or diaphoretic.  HENT:     Head: Normocephalic and atraumatic.     Right Ear: Tympanic membrane, ear canal and external ear normal.     Left Ear: Tympanic membrane, ear canal and external ear normal.  Eyes:     Conjunctiva/sclera: Conjunctivae normal.   Neck:     Musculoskeletal: Normal range of motion and neck supple.  Cardiovascular:     Rate and Rhythm: Normal rate and regular rhythm.     Heart sounds: Normal heart sounds.  Pulmonary:     Effort: Pulmonary effort is normal.     Breath sounds: Normal breath sounds.  Abdominal:     General: Abdomen is flat. Bowel sounds are normal.     Palpations: Abdomen is soft.  Genitourinary:    Exam position: Supine.     Pubic Area: No rash.      Labia:        Right: No rash, tenderness, lesion or injury.        Left: No rash, tenderness, lesion or injury.      Urethra: No prolapse, urethral pain, urethral swelling or urethral lesion.     Vagina: Tenderness present. No vaginal discharge, erythema or bleeding.     Cervix: No discharge, friability, lesion, erythema or cervical bleeding.     Comments: Vaginal walls pale, dry.  Musculoskeletal:     Right lower leg: No edema.     Left lower leg: No edema.  Skin:    General: Skin is warm and dry.  Neurological:     Mental Status: She is alert and oriented to person, place, and time.  Psychiatric:        Mood and Affect: Mood normal.        Behavior: Behavior normal.        Thought Content: Thought content normal.        Judgment: Judgment normal.       BP 130/62   Pulse 87   Temp 98.6 F (37 C)   Resp 18   Ht 5' 0.5" (1.537 m)   Wt 139 lb 8 oz (63.3 kg)   BMI 26.80 kg/m  Wt Readings from Last 3 Encounters:  07/31/18 139 lb 8 oz (63.3 kg)  04/01/18 140 lb (63.5 kg)  03/09/18 140 lb (63.5 kg)   Results for orders placed or performed in visit on 07/31/18  POCT urinalysis dipstick  Result Value Ref Range   Color, UA light yellow    Clarity, UA clear    Glucose, UA Negative Negative   Bilirubin, UA negative    Ketones, UA negative    Spec Grav, UA 1.015 1.010 - 1.025   Blood, UA negative    pH, UA 7.0 5.0 - 8.0   Protein, UA Negative Negative   Urobilinogen, UA 0.2 0.2 or 1.0 E.U./dL   Nitrite, UA negative    Leukocytes,  UA Negative Negative   Appearance     Odor         Assessment & Plan:  1. Annual physical exam - Discussed and encouraged healthy lifestyle choices- adequate sleep, regular exercise, stress management and healthy food choices.  2. Dysuria - urine normal - POCT urinalysis dipstick  3. Need for Tdap vaccination - Tdap vaccine greater than or equal to 7yo IM  4. Vaginal atrophy - discussed findings with patient and relationship between estrogen, vaginal symptoms and urinary incontinence. - conjugated estrogens (PREMARIN) vaginal cream; Place 1 Applicatorful vaginally 3 (three) times a week.  Dispense: 42.5 g; Refill: 3  5. Cervical cancer screening - Cytology - PAP([place])  6. Stress incontinence - conjugated estrogens (PREMARIN) vaginal cream; Place 1 Applicatorful vaginally 3 (three) times a week.  Dispense: 42.5 g; Refill: 3   Clarene Reamer, FNP-BC  [facility], [place]  08/01/2018 8:46 AM

## 2018-07-31 NOTE — Patient Instructions (Signed)
Good to see you today  I have sent a Premarin Cream to your pharmacy- look online for coupons/ copay cards. It will help with urine leaking and painful intercourse   Health Maintenance, Female Adopting a healthy lifestyle and getting preventive care are important in promoting health and wellness. Ask your health care provider about:  The right schedule for you to have regular tests and exams.  Things you can do on your own to prevent diseases and keep yourself healthy. What should I know about diet, weight, and exercise? Eat a healthy diet   Eat a diet that includes plenty of vegetables, fruits, low-fat dairy products, and lean protein.  Do not eat a lot of foods that are high in solid fats, added sugars, or sodium. Maintain a healthy weight Body mass index (BMI) is used to identify weight problems. It estimates body fat based on height and weight. Your health care provider can help determine your BMI and help you achieve or maintain a healthy weight. Get regular exercise Get regular exercise. This is one of the most important things you can do for your health. Most adults should:  Exercise for at least 150 minutes each week. The exercise should increase your heart rate and make you sweat (moderate-intensity exercise).  Do strengthening exercises at least twice a week. This is in addition to the moderate-intensity exercise.  Spend less time sitting. Even light physical activity can be beneficial. Watch cholesterol and blood lipids Have your blood tested for lipids and cholesterol at 63 years of age, then have this test every 5 years. Have your cholesterol levels checked more often if:  Your lipid or cholesterol levels are high.  You are older than 63 years of age.  You are at high risk for heart disease. What should I know about cancer screening? Depending on your health history and family history, you may need to have cancer screening at various ages. This may include screening  for:  Breast cancer.  Cervical cancer.  Colorectal cancer.  Skin cancer.  Lung cancer. What should I know about heart disease, diabetes, and high blood pressure? Blood pressure and heart disease  High blood pressure causes heart disease and increases the risk of stroke. This is more likely to develop in people who have high blood pressure readings, are of African descent, or are overweight.  Have your blood pressure checked: ? Every 3-5 years if you are 72-79 years of age. ? Every year if you are 71 years old or older. Diabetes Have regular diabetes screenings. This checks your fasting blood sugar level. Have the screening done:  Once every three years after age 64 if you are at a normal weight and have a low risk for diabetes.  More often and at a younger age if you are overweight or have a high risk for diabetes. What should I know about preventing infection? Hepatitis B If you have a higher risk for hepatitis B, you should be screened for this virus. Talk with your health care provider to find out if you are at risk for hepatitis B infection. Hepatitis C Testing is recommended for:  Everyone born from [birth year] through 1965.  Anyone with known risk factors for hepatitis C. Sexually transmitted infections (STIs)  Get screened for STIs, including gonorrhea and chlamydia, if: ? You are sexually active and are younger than 63 years of age. ? You are older than 63 years of age and your health care provider tells you that you are at risk  for this type of infection. ? Your sexual activity has changed since you were last screened, and you are at increased risk for chlamydia or gonorrhea. Ask your health care provider if you are at risk.  Ask your health care provider about whether you are at high risk for HIV. Your health care provider may recommend a prescription medicine to help prevent HIV infection. If you choose to take medicine to prevent HIV, you should first get tested for HIV.  You should then be tested every 3 months for as long as you are taking the medicine. Pregnancy  If you are about to stop having your period (premenopausal) and you may become pregnant, seek counseling before you get pregnant.  Take 400 to 800 micrograms (mcg) of folic acid every day if you become pregnant.  Ask for birth control (contraception) if you want to prevent pregnancy. Osteoporosis and menopause Osteoporosis is a disease in which the bones lose minerals and strength with aging. This can result in bone fractures. If you are 68 years old or older, or if you are at risk for osteoporosis and fractures, ask your health care provider if you should:  Be screened for bone loss.  Take a calcium or vitamin D supplement to lower your risk of fractures.  Be given hormone replacement therapy (HRT) to treat symptoms of menopause. Follow these instructions at home: Lifestyle  Do not use any products that contain nicotine or tobacco, such as cigarettes, e-cigarettes, and chewing tobacco. If you need help quitting, ask your health care provider.  Do not use street drugs.  Do not share needles.  Ask your health care provider for help if you need support or information about quitting drugs. Alcohol use  Do not drink alcohol if: ? Your health care provider tells you not to drink. ? You are pregnant, may be pregnant, or are planning to become pregnant.  If you drink alcohol: ? Limit how much you use to 0-1 drink a day. ? Limit intake if you are breastfeeding.  Be aware of how much alcohol is in your drink. In the [place] one drink equals one 12 oz bottle of beer (355 mL), one 5 oz glass of wine (148 mL), or one 1 oz glass of hard liquor (44 mL). General instructions  Schedule regular health, dental, and eye exams.  Stay current with your vaccines.  Tell your health care provider if: ? You often feel depressed. ? You have ever been abused or do not feel safe at home. Summary   Adopting a healthy lifestyle and getting preventive care are important in promoting health and wellness.  Follow your health care provider's instructions about healthy diet, exercising, and getting tested or screened for diseases.  Follow your health care provider's instructions on monitoring your cholesterol and blood pressure. This information is not intended to replace advice given to you by your health care provider. Make sure you discuss any questions you have with your health care provider. Document Released: 07/16/2010 Document Revised: 12/24/2017 Document Reviewed: 12/24/2017 Elsevier Patient Education  2020 [employer].

## 2018-08-01 ENCOUNTER — Encounter: Payer: Self-pay | Admitting: Family Medicine

## 2018-08-04 LAB — CYTOLOGY - PAP
Diagnosis: NEGATIVE
HPV: NOT DETECTED

## 2018-08-05 ENCOUNTER — Telehealth: Payer: Self-pay | Admitting: Family Medicine

## 2018-08-05 NOTE — Telephone Encounter (Signed)
[facility] to the [facility] to [facility] Premarin cream. She stated that the coupons she has will not work at the pharmacy and without the coupons the cream would cost her over $300. Patient stated she could not afford that and would like to know if there is an alternative that could be sent in    C/B # [phone]

## 2018-08-07 ENCOUNTER — Other Ambulatory Visit: Payer: Self-pay

## 2018-08-07 ENCOUNTER — Other Ambulatory Visit: Payer: Self-pay | Admitting: Family Medicine

## 2018-08-07 DIAGNOSIS — N952 Postmenopausal atrophic vaginitis: Secondary | ICD-10-CM

## 2018-08-07 MED ORDER — NONFORMULARY OR COMPOUNDED ITEM: 1.0000 | 1 refills | Status: DC

## 2018-08-07 NOTE — Telephone Encounter (Signed)
Spoke with pt relaying Debbie's message.  Pt verbalizes understanding and requests rx be mailed to her.

## 2018-08-07 NOTE — Telephone Encounter (Signed)
Please call patient and tell her that I have written a prescription for a compounded version. She will need to get it filled at a compounding pharmacy like [facility] at [address] in [place]. I have printed the prescription. Please ask if she wants it mailed to her or she can call around and check prices and we can fax it to her preferred pharmacy.

## 2018-08-19 ENCOUNTER — Telehealth: Payer: Self-pay

## 2018-08-19 NOTE — Telephone Encounter (Signed)
Pt states that she was rx'd Estrogen Cream last OV.  Pt states that her pharmacy is not able to make the compound and they recommended - Alternative is "Travial .0002%"  Pt advised that I will call the pharmacy in the morning.

## 2018-08-19 NOTE — Telephone Encounter (Signed)
Pt left v/m that pharmacist at [facility] request cb at [phone]; pharmacist wants to talk with someone about rx that was brought to the pharmacy to discuss possible suggestions of what pt should take and how much.Pt request cb after pharmacy is contacted.

## 2018-08-19 NOTE — Telephone Encounter (Signed)
LM for call back

## 2018-08-21 NOTE — Telephone Encounter (Signed)
[facility] to [facility] Cream Spoke with Pharmacist and he stated that they are not able to do compound cream  The closest thing that they can get to what Nicole Calhoun is wanting is Premarin 0.625mg /g cream  He states that to get a compounded cream we would have to order it through a compounding company OR a pharmacy that does compounded solutions -- most chain pharmacy do not do compounds.   Please advise.

## 2018-08-21 NOTE — Telephone Encounter (Signed)
Please call patient and let her know that she has to go to a compounding pharmacy, the only ones I know of are in [place], [place]. I recommend she call them to verify they can compound it before she goes.

## 2018-08-21 NOTE — Telephone Encounter (Signed)
Pt aware of recommendations per Debbie. Pt states that she will take the Rx by either [facility] or [facility]- advised her to call first to ensure they do compounding. Pt to call back if any issues with the Rx. Nothing further needed.

## 2018-10-13 ENCOUNTER — Other Ambulatory Visit: Payer: Self-pay | Admitting: Family Medicine

## 2018-10-13 DIAGNOSIS — F329 Major depressive disorder, single episode, unspecified: Secondary | ICD-10-CM

## 2018-10-13 DIAGNOSIS — F32A Depression, unspecified: Secondary | ICD-10-CM

## 2019-03-10 ENCOUNTER — Other Ambulatory Visit: Payer: Self-pay | Admitting: Family Medicine

## 2019-03-10 DIAGNOSIS — Z1231 Encounter for screening mammogram for malignant neoplasm of breast: Secondary | ICD-10-CM

## 2019-04-04 ENCOUNTER — Other Ambulatory Visit: Payer: Self-pay | Admitting: Family Medicine

## 2019-04-12 ENCOUNTER — Ambulatory Visit
Admission: RE | Admit: 2019-04-12 | Discharge: 2019-04-12 | Disposition: A | Discharge status: Home / Self Care | Payer: Self-pay | Source: Ambulatory Visit | Attending: Family Medicine | Admitting: Family Medicine

## 2019-04-12 ENCOUNTER — Other Ambulatory Visit: Payer: Self-pay

## 2019-04-12 DIAGNOSIS — Z1231 Encounter for screening mammogram for malignant neoplasm of breast: Secondary | ICD-10-CM

## 2019-04-19 ENCOUNTER — Telehealth: Payer: Self-pay | Admitting: Family Medicine

## 2019-04-19 NOTE — Telephone Encounter (Signed)
Pt states that she had her Mammogram done and there were densities found in both breasts and they recommended an ultrasound - she needs a referral for this.   Pt is also having pain at her collarbone on the right side -- fears may be related to the issues with her breasts?  ACR Breast Density Category c: The breast tissue is heterogeneously dense, which may obscure small masses.    Please advise, thanks.

## 2019-04-19 NOTE — Telephone Encounter (Signed)
Please call patient and let her know that I have reviewed her mammogram and I do not see a recommendation for an ultrasound, just repeat mammogram in 1 year and that breasts are dense. Please offer her an office visit for collarbone symptoms.

## 2019-04-19 NOTE — Telephone Encounter (Signed)
Patient states she has noticed a pea sized knot in her right breast, and she states this is the side she is having the collarbone pain also. Patient has been scheduled for 04/30/19 to discuss this is more detail and to have this evaluated.  Thanks

## 2019-04-30 ENCOUNTER — Ambulatory Visit (INDEPENDENT_AMBULATORY_CARE_PROVIDER_SITE_OTHER): Payer: Self-pay | Admitting: Family Medicine

## 2019-04-30 ENCOUNTER — Encounter: Payer: Self-pay | Admitting: Family Medicine

## 2019-04-30 ENCOUNTER — Other Ambulatory Visit: Payer: Self-pay

## 2019-04-30 ENCOUNTER — Ambulatory Visit (INDEPENDENT_AMBULATORY_CARE_PROVIDER_SITE_OTHER)
Admission: RE | Admit: 2019-04-30 | Discharge: 2019-04-30 | Disposition: A | Discharge status: Home / Self Care | Payer: Self-pay | Source: Ambulatory Visit | Attending: Family Medicine | Admitting: Family Medicine

## 2019-04-30 VITALS — BP 130/62 | HR 81 | Temp 97.4°F | Ht 60.5 in | Wt 142.0 lb

## 2019-04-30 DIAGNOSIS — G8929 Other chronic pain: Secondary | ICD-10-CM

## 2019-04-30 DIAGNOSIS — M25511 Pain in right shoulder: Secondary | ICD-10-CM

## 2019-04-30 DIAGNOSIS — M25561 Pain in right knee: Secondary | ICD-10-CM

## 2019-04-30 DIAGNOSIS — N649 Disorder of breast, unspecified: Secondary | ICD-10-CM

## 2019-04-30 NOTE — Patient Instructions (Signed)
Good to see you today  For knee, shoulder-  Can try Alleve 1-2 tablets every 12 hours with food. Use lowest dose possible. Can also apply Voltaran Gel up to 4 times a day  Consider regular massage   I will notify you of xray results

## 2019-04-30 NOTE — Progress Notes (Signed)
Subjective:    Patient ID: Nicole Calhoun, female    DOB: [birth year], 64 y.o.   MRN: [id number]  HPI  Chief Complaint  Patient presents with  . Breast Pain    Right breast/collarbone pain x3 months - Pea sized lump in right breast  . Knee Pain    Right knee swelling   Review of Systems  Constitutional: Negative.   HENT: Positive for hearing loss.   Eyes: Negative.   Respiratory: Negative.   Cardiovascular: Negative.   Gastrointestinal: Negative.   Genitourinary: Negative.   Musculoskeletal: Positive for joint pain and neck pain.  Neurological: Negative.   Psychiatric/Behavioral: Negative.        Review of Systems  Constitutional: Negative.   HENT: Positive for hearing loss.   Eyes: Negative.   Respiratory: Negative.   Cardiovascular: Negative.   Gastrointestinal: Negative.   Genitourinary: Negative.   Musculoskeletal: Positive for joint pain and neck pain.  Neurological: Negative.   Psychiatric/Behavioral: Negative.        Objective:   Physical Exam Constitutional:      Appearance: She is well-developed and well-nourished.  HENT:     Head: Normocephalic and atraumatic.  Eyes:     Extraocular Movements: EOM normal.     Conjunctiva/sclera: Conjunctivae normal.     Pupils: Pupils are equal, round, and reactive to light.  Neck:     Comments: Neck stiffness, limited ROM to the left and to the right sides Cardiovascular:     Rate and Rhythm: Normal rate and regular rhythm.     Heart sounds: Normal heart sounds.  Pulmonary:     Effort: Pulmonary effort is normal.     Breath sounds: Normal breath sounds.  Abdominal:     General: Bowel sounds are normal.     Palpations: Abdomen is soft.  Musculoskeletal:     Comments: Neck stiffness, limited ROM. Right knee is edematous, with noted tenderness with movements.  Skin:    General: Skin is warm and dry.  Neurological:     Mental Status: She is alert and oriented to person, place, and time.  Psychiatric:          Mood and Affect: Mood and affect normal.        Behavior: Behavior normal.        Thought Content: Thought content normal.     BP 130/62 (BP Location: Left Arm, Patient Position: Sitting, Cuff Size: Normal)   Pulse 81   Temp (!) 97.4 F (36.3 C) (Temporal)   Ht 5' 0.5" (1.537 m)   Wt 142 lb (64.4 kg)   SpO2 98%   BMI 27.28 kg/m  BP Readings from Last 3 Encounters:  04/30/19 130/62  07/31/18 130/62  04/01/18 118/72   Wt Readings from Last 3 Encounters:  04/30/19 142 lb (64.4 kg)  07/31/18 139 lb 8 oz (63.3 kg)  04/01/18 140 lb (63.5 kg)   Physical Exam  Constitutional: She is oriented to person, place, and time. She appears well-developed and well-nourished.  HENT:  Head: Normocephalic and atraumatic.  Eyes: Pupils are equal, round, and reactive to light. Conjunctivae and EOM are normal.  Neck:  Neck stiffness, limited ROM to the left and to the right sides  Cardiovascular: Normal rate, regular rhythm and normal heart sounds.  Respiratory: Effort normal and breath sounds normal.  GI: Soft. Bowel sounds are normal.  Musculoskeletal:     Comments: Neck stiffness, limited ROM. Right knee is edematous, with noted tenderness with movements.  Neurological: She is alert and oriented to person, place, and time.  Skin: Skin is warm and dry.  Psychiatric: She has a normal mood and affect. Her behavior is normal. Thought content normal.          Assessment & Plan:  1.Right knee arthritis Will order Xray of the knee. Regular exercises recommended. Aleve for pain relive 1 tablet twice a day, might increase the dose up to 2 tablets twice a day.    2. Acute injury to the right trapezius muscle Alternate warm and cold compresses. Stretching exercises. Avoid to pull heavy objects.   3. Breast density Discussed the family history of the breast cancer. Addressed the patient's concerns regarding a small neoplasm in her right breast. Explained the results of the recent mammogram.  Patient demonstrated understanding.

## 2019-04-30 NOTE — Progress Notes (Signed)
Subjective:    Patient ID: Nicole Calhoun, female    DOB: 11-22-1955, 64 y.o.   MRN: [id number]  HPI Chief Complaint  Patient presents with  . Breast Pain    Right breast/collarbone pain x3 months - Pea sized lump in right breast  . Knee Pain    Right knee swelling     This is a 64 yo female who presents today with above cc.   Breast lesion- Has noticed a pea sized area of right breast. Had mammogram, told tech about area. Negative mammogram.   Right collar bone pain- saw ortho, didn't find exercises helpful, declined steroid injection. Always has tightness in neck. She is left handed but takes care of her 4 grandchildren (not all at one time) and holds/ lifts the 3 youngest.   Right knee- pain and swelling, intermittent, stopped taking her ibuprofen 800 mg bid/ tid when she got her Covid vaccine several weeks ago. Sometimes feels like it will give way. Has to be careful with stepping down.     Review of Systems Per HPI    Objective:   Physical Exam Vitals reviewed.  Constitutional:      General: She is not in acute distress.    Appearance: Normal appearance. She is normal weight. She is not ill-appearing, toxic-appearing or diaphoretic.  HENT:     Head: Normocephalic and atraumatic.  Neck:     Comments: Right shoulder with good rom, no clavicular tenderness, bilateral trapezius tenderness.  Cardiovascular:     Rate and Rhythm: Normal rate.  Pulmonary:     Effort: Pulmonary effort is normal.  Chest:    Musculoskeletal:     Right lower leg: No edema.     Left lower leg: No edema.  Skin:    General: Skin is warm and dry.  Neurological:     Mental Status: She is alert and oriented to person, place, and time.  Psychiatric:        Mood and Affect: Mood normal.        Behavior: Behavior normal.        Thought Content: Thought content normal.        Judgment: Judgment normal.       BP 130/62 (BP Location: Left Arm, Patient Position: Sitting, Cuff Size: Normal)    Pulse 81   Temp (!) 97.4 F (36.3 C) (Temporal)   Ht 5' 0.5" (1.537 m)   Wt 142 lb (64.4 kg)   SpO2 98%   BMI 27.28 kg/m  Wt Readings from Last 3 Encounters:  04/30/19 142 lb (64.4 kg)  07/31/18 139 lb 8 oz (63.3 kg)  04/01/18 140 lb (63.5 kg)       Assessment & Plan:  1. Chronic pain of right knee - will check xray, recommended she follow up with sports medicine, Dr. Lorelei Pont in office, discussed otc analgesics, slip on brace, heat/ice, topical treatments - DG Knee Complete 4 Views Right; Future  2. Breast lesion - has been present for awhile, had negative mammogram. It is very superficial, small, mobile and rubbery. She will continue to monitor for any changes  3. Chronic right shoulder pain - intermittent in nature, ROM preserved, suspect more from tight neck/ trapezius - encouraged stretching, interventions per #1  This visit occurred during the SARS-CoV-2 public health emergency.  Safety protocols were in place, including screening questions prior to the visit, additional usage of staff PPE, and extensive cleaning of exam room while observing appropriate contact time as indicated  for disinfecting solutions.      Nicole Reamer, FNP-BC  [facility] at [facility], [facility]  04/30/2019 10:24 AM

## 2019-05-31 ENCOUNTER — Other Ambulatory Visit: Payer: Self-pay | Admitting: Family Medicine

## 2019-05-31 DIAGNOSIS — F32A Depression, unspecified: Secondary | ICD-10-CM

## 2019-06-02 NOTE — Telephone Encounter (Signed)
Last refilled 10/15/2018 #90 x 2 refills Last OV 04/30/2019  Please advise, thanks.

## 2019-07-27 ENCOUNTER — Other Ambulatory Visit: Payer: Self-pay

## 2019-07-27 MED ORDER — VALACYCLOVIR HCL 500 MG PO TABS: ORAL_TABLET | ORAL | 2 refills | Status: DC

## 2019-07-27 NOTE — Telephone Encounter (Signed)
Pt left v/m that pharmacy advised pt to call PCP about refill on Valacyclovir. Pt last annual 07/31/18.[facility].  No future appt scheduled. Last refilled valacyclovir 500 mg on 05/15/2017.Please advise. I spoke with pt and now pt has break out in genital area and request refill of Valacyclovir.

## 2019-09-21 ENCOUNTER — Other Ambulatory Visit: Payer: Self-pay | Admitting: Family Medicine

## 2019-09-21 DIAGNOSIS — E7800 Pure hypercholesterolemia, unspecified: Secondary | ICD-10-CM

## 2019-09-21 DIAGNOSIS — M810 Age-related osteoporosis without current pathological fracture: Secondary | ICD-10-CM

## 2019-09-21 DIAGNOSIS — E663 Overweight: Secondary | ICD-10-CM

## 2019-09-22 ENCOUNTER — Other Ambulatory Visit: Payer: Self-pay

## 2019-09-22 ENCOUNTER — Other Ambulatory Visit (INDEPENDENT_AMBULATORY_CARE_PROVIDER_SITE_OTHER): Payer: Self-pay

## 2019-09-22 DIAGNOSIS — M810 Age-related osteoporosis without current pathological fracture: Secondary | ICD-10-CM

## 2019-09-22 DIAGNOSIS — E7800 Pure hypercholesterolemia, unspecified: Secondary | ICD-10-CM

## 2019-09-22 DIAGNOSIS — E663 Overweight: Secondary | ICD-10-CM

## 2019-09-22 LAB — COMPREHENSIVE METABOLIC PANEL
ALT: 16 U/L (ref 0–35)
AST: 22 U/L (ref 0–37)
Albumin: 3.9 g/dL (ref 3.5–5.2)
Alkaline Phosphatase: 59 U/L (ref 39–117)
BUN: 8 mg/dL (ref 6–23)
CO2: 34 mEq/L — ABNORMAL HIGH (ref 19–32)
Calcium: 9 mg/dL (ref 8.4–10.5)
Chloride: 104 mEq/L (ref 96–112)
Creatinine, Ser: 0.67 mg/dL (ref 0.40–1.20)
GFR: 88.7 mL/min (ref >60.00)
Glucose, Bld: 94 mg/dL (ref 70–99)
Potassium: 4 mEq/L (ref 3.5–5.1)
Sodium: 142 mEq/L (ref 135–145)
Total Bilirubin: 0.6 mg/dL (ref 0.2–1.2)
Total Protein: 6.4 g/dL (ref 6.0–8.3)

## 2019-09-22 LAB — CBC WITH DIFFERENTIAL/PLATELET
Basophils Absolute: 0 10*3/uL (ref 0.0–0.1)
Basophils Relative: 0.7 % (ref 0.0–3.0)
Eosinophils Absolute: 0.1 10*3/uL (ref 0.0–0.7)
Eosinophils Relative: 2.1 % (ref 0.0–5.0)
HCT: 39.1 % (ref 36.0–46.0)
Hemoglobin: 13.3 g/dL (ref 12.0–15.0)
Lymphocytes Relative: 39.7 % (ref 12.0–46.0)
Lymphs Abs: 2.2 10*3/uL (ref 0.7–4.0)
MCHC: 34.1 g/dL (ref 30.0–36.0)
MCV: 91.4 fl (ref 78.0–100.0)
Monocytes Absolute: 0.4 10*3/uL (ref 0.1–1.0)
Monocytes Relative: 7.3 % (ref 3.0–12.0)
Neutro Abs: 2.8 10*3/uL (ref 1.4–7.7)
Neutrophils Relative %: 50.2 % (ref 43.0–77.0)
Platelets: 219 10*3/uL (ref 150.0–400.0)
RBC: 4.27 Mil/uL (ref 3.87–5.11)
RDW: 13 % (ref 11.5–15.5)
WBC: 5.7 10*3/uL (ref 4.0–10.5)

## 2019-09-22 LAB — LIPID PANEL
Cholesterol: 178 mg/dL (ref 0–200)
HDL: 58.2 mg/dL (ref >39.00)
LDL Cholesterol: 102 mg/dL — ABNORMAL HIGH (ref 0–99)
NonHDL: 120.08
Total CHOL/HDL Ratio: 3
Triglycerides: 88 mg/dL (ref 0.0–149.0)
VLDL: 17.6 mg/dL (ref 0.0–40.0)

## 2019-09-22 LAB — TSH: TSH: 3.19 u[IU]/mL (ref 0.35–4.50)

## 2019-09-22 LAB — VITAMIN D 25 HYDROXY (VIT D DEFICIENCY, FRACTURES): VITD: 29.4 ng/mL — ABNORMAL LOW (ref 30.00–100.00)

## 2019-09-27 ENCOUNTER — Encounter: Payer: Self-pay | Admitting: Family Medicine

## 2019-09-27 ENCOUNTER — Ambulatory Visit: Payer: Self-pay | Admitting: Family Medicine

## 2019-09-27 ENCOUNTER — Other Ambulatory Visit: Payer: Self-pay

## 2019-09-27 VITALS — BP 120/72 | HR 87 | Temp 98.0°F | Ht 60.0 in | Wt 141.5 lb

## 2019-09-27 DIAGNOSIS — F32A Depression, unspecified: Secondary | ICD-10-CM

## 2019-09-27 DIAGNOSIS — Z0000 Encounter for general adult medical examination without abnormal findings: Secondary | ICD-10-CM

## 2019-09-27 DIAGNOSIS — F419 Anxiety disorder, unspecified: Secondary | ICD-10-CM

## 2019-09-27 DIAGNOSIS — Z23 Encounter for immunization: Secondary | ICD-10-CM

## 2019-09-27 DIAGNOSIS — F329 Major depressive disorder, single episode, unspecified: Secondary | ICD-10-CM

## 2019-09-27 MED ORDER — SERTRALINE HCL 50 MG PO TABS: 50.0000 mg | ORAL_TABLET | Freq: Every day | ORAL | 5 refills | Status: DC

## 2019-09-27 NOTE — Progress Notes (Addendum)
Subjective:    Patient ID: Nicole Calhoun, female    DOB: February 03, 1955, 64 y.o.   MRN: [id number]  HPI Chief Complaint  Patient presents with  . Annual Exam  . Anxiety   This is a 64 yo female who presents today for annual exam.  She was awarded disability status due to chronic back pain.  Last CPE- 07/31/18 Mammo- 04/12/19 Pap-07/31/2018 Colonoscopy- 07/31/2017 Tdap- 07/31/2018 Flu- annual Covid 19 vaccine-  Eye- regular Dental- regular Exercise- walking Diet-eating more carbs and sugars lately, drinking sweet tea daily has noticed a little weight gain  Anxiety- has been feeling more anxious, forgetful lately. Increased irritability. Not sleeping well. Racing thoughts. Regular schedule. Keeping her grand kids, 1,2 yo, sometimes 8 mos, 93 yo.  Has been on Wellbutrin XR 150 mg for several years.  Many years ago was on citalopram.  Does not know why she came off.   Review of Systems  Constitutional: Negative.   HENT: Negative.   Eyes: Negative.   Respiratory: Negative.   Cardiovascular: Negative.   Endocrine: Negative.   Genitourinary: Negative.   Musculoskeletal: Positive for back pain (chronic, follows with ortho).  Skin: Negative.   Allergic/Immunologic: Negative.   Neurological: Negative.   Hematological: Negative.   Psychiatric/Behavioral: Positive for decreased concentration, dysphoric mood and sleep disturbance. The patient is nervous/anxious.        Objective:   Physical Exam Physical Exam  Constitutional: She is oriented to person, place, and time. She appears well-developed and well-nourished. No distress.  HENT:  Head: Normocephalic and atraumatic.  Right Ear: External ear normal. TM normal.  Left Ear: External ear normal. TM normal.  Nose: Nose normal.  Mouth/Throat: Oropharynx is clear and moist. No oropharyngeal exudate.  Eyes: Conjunctivae are normal.   Neck: Normal range of motion. Neck supple. No JVD present. No thyromegaly present.  Cardiovascular:  Normal rate, regular rhythm, normal heart sounds and intact distal pulses.   Pulmonary/Chest: Effort normal and breath sounds normal. Right breast exhibits no inverted nipple, no mass, no nipple discharge, no skin change and no tenderness. Left breast exhibits no inverted nipple, no mass, no nipple discharge, no skin change and no tenderness. Breasts are symmetrical.  Abdominal: Soft. Bowel sounds are normal. She exhibits no distension and no mass. There is no tenderness. There is no rebound and no guarding.  Musculoskeletal: Normal range of motion. She exhibits no edema or tenderness.  Lymphadenopathy:    She has no cervical adenopathy.  Neurological: She is alert and oriented to person, place, and time.   Skin: Skin is warm and dry. She is not diaphoretic.  Psychiatric: She has a normal mood and affect. Her behavior is normal. Judgment and thought content normal.  Vitals reviewed.     BP 120/72   Pulse 87   Temp 98 F (36.7 C) (Temporal)   Ht 5' (1.524 m)   Wt 141 lb 8 oz (64.2 kg)   SpO2 97%   BMI 27.63 kg/m  Wt Readings from Last 3 Encounters:  09/27/19 141 lb 8 oz (64.2 kg)  04/30/19 142 lb (64.4 kg)  07/31/18 139 lb 8 oz (63.3 kg)   Depression screen [facility] 2/9 09/27/2019 06/13/2017 03/23/2015  Decreased Interest 1 0 0  Down, Depressed, Hopeless 2 0 0  PHQ - 2 Score 3 0 0  Altered sleeping 3 - -  Tired, decreased energy 1 - -  Change in appetite 2 - -  Feeling bad or failure about yourself  2 - -  Trouble concentrating 1 - -  Moving slowly or fidgety/restless 1 - -  Suicidal thoughts 0 - -  PHQ-9 Score 13 - -  Difficult doing work/chores Very difficult - -   GAD 7 : Generalized Anxiety Score 09/27/2019  Nervous, Anxious, on Edge 2  Control/stop worrying 2  Worry too much - different things 2  Trouble relaxing 2  Restless 1  Easily annoyed or irritable 2  Afraid - awful might happen 2  Total GAD 7 Score 13  Anxiety Difficulty Very difficult        Assessment &  Plan:  1. Annual physical exam - Discussed and encouraged healthy lifestyle choices- adequate sleep, regular exercise, stress management and healthy food choices.    2. Anxiety and depression - will add SSRI, provided written and verbal information regarding nonpharmacologic interventions for anxiety and depression -Follow-up in 8 weeks, can be virtual visit - sertraline (ZOLOFT) 50 MG tablet; Take 1 tablet (50 mg total) by mouth daily.  Dispense: 30 tablet; Refill: 5  3. Need for influenza vaccination - Flu Vaccine QUAD 6+ mos PF IM (Fluarix Quad PF)  This visit occurred during the SARS-CoV-2 public health emergency.  Safety protocols were in place, including screening questions prior to the visit, additional usage of staff PPE, and extensive cleaning of exam room while observing appropriate contact time as indicated for disinfecting solutions.     Clarene Reamer, FNP-BC  [facility] at [facility], [facility]  09/27/2019 4:40 PM

## 2019-09-27 NOTE — Patient Instructions (Signed)
Good to see you today  I have sent in sertraline for your anxiety. Take 1/2 tablet at bedtime for 5 days then increase to 1 tablet  Follow up with me in 8 weeks, virtual visit to see how anxiety is doing  Medication for depression and anxiety often takes 6-8 weeks to have a noticeable difference so stick with it. Also the best way for recovery is taking medication and seeing a therapist -- this is so important.      How to help anxiety and depression   1) Regular Exercise - walking, jogging, cycling, dancing, strength training - aiming for 150 minutes of exercise a week -- Yoga has been shown in research to reduce depression and anxiety -- with even just one hour long session per week -- Walk leisurely for 30 minutes every day  2)  Begin a Mindfulness/Meditation practice -- this can take a little as 3 minutes and is helpful for all kinds of mood issues -- You can find resources in books -- Or you can download apps like  -- Headspace App  -- Calm  -- Insignt Timer -- Stop, Breathe & Think   # With each of these Apps - you should decline the "start free trial" offer and as you search through the App should be able to access some of their free content. You can also chose to pay for the content if you find one that works well for you.    # Many of them also offer sleep specific content which may help with insomnia   3) Healthy Diet - Avoid fast foods and processed foods, eat mostly lean proteins, vegetables, fruits and whole grains -- Avoid or decrease Caffeine -- Avoid or decrease Alcohol -- Drink plenty of water, have a balanced diet -- Avoid cigarettes and marijuana (as well as other recreational drugs)   4) Consider contacting a professional therapist  - [place] [phone]

## 2019-11-24 ENCOUNTER — Encounter: Payer: Self-pay | Admitting: Family Medicine

## 2019-11-24 ENCOUNTER — Telehealth (INDEPENDENT_AMBULATORY_CARE_PROVIDER_SITE_OTHER): Payer: Self-pay | Admitting: Family Medicine

## 2019-11-24 VITALS — Ht 60.0 in | Wt 133.0 lb

## 2019-11-24 DIAGNOSIS — F419 Anxiety disorder, unspecified: Secondary | ICD-10-CM

## 2019-11-24 DIAGNOSIS — R922 Inconclusive mammogram: Secondary | ICD-10-CM | POA: Insufficient documentation

## 2019-11-24 DIAGNOSIS — R9230 Dense breasts, unspecified: Secondary | ICD-10-CM | POA: Insufficient documentation

## 2019-11-24 DIAGNOSIS — F32A Depression, unspecified: Secondary | ICD-10-CM

## 2019-11-24 MED ORDER — SERTRALINE HCL 50 MG PO TABS: 50.0000 mg | ORAL_TABLET | Freq: Every day | ORAL | 3 refills | Status: DC

## 2019-11-24 NOTE — Progress Notes (Signed)
Virtual Visit via Video Note  I connected with Nicole Calhoun on 11/24/19 at  2:30 PM EST by a video enabled telemedicine application and verified that I am speaking with the correct person using two identifiers.  Location: Patient: At her home Provider: [place] Persons participating in virtual visit: Patient, provider   I discussed the limitations of evaluation and management by telemedicine and the availability of in person appointments. The patient expressed understanding and agreed to proceed.  History of Present Illness: Chief Complaint  Patient presents with  . Follow-up     pt states zoloft is helping   This is a 64 yo female who presents today for follow-up of anxiety.  She was started on sertraline at her annual physical exam in September.  She reports that she has been doing much better as far as her anxiety goes.  She is happy at the current dose and other than having some occasional vivid dreams denies additional side effects.  Patient has a family history of breast cancer, her grandmother died in her 64s of breast cancer.  She has some other cancers in her family including brain and lung.  She has a history of dense breasts on mammogram.  She is asking today about the fast MRI of the breast.   Observations/Objective: Patient is alert and answers questions appropriately.  Visible skin is unremarkable.  She is normally conversive without increased work of breathing.  No audible wheeze or witnessed cough.  Mood and affect are appropriate.  Ht 5' (1.524 m)   Wt 133 lb (60.3 kg)   BMI 25.97 kg/m  Wt Readings from Last 3 Encounters:  11/24/19 133 lb (60.3 kg)  09/27/19 141 lb 8 oz (64.2 kg)  04/30/19 142 lb (64.4 kg)    Assessment and Plan: 1. Anxiety and depression -She will stay on sertraline 50 mg dose for now and let me know if it anytime she would like to increase. -90-day supply with refills sent to pharmacy, follow-up in 6 months - sertraline (ZOLOFT)  50 MG tablet; Take 1 tablet (50 mg total) by mouth daily.  Dispense: 90 tablet; Refill: 3  2. Dense breast tissue on mammogram -Reviewed most recent mammogram and discussed pros and cons of additional screening   Clarene Reamer, FNP-BC  [facility] at [facility], [facility]  11/24/2019 2:55 PM   Follow Up Instructions:    I discussed the assessment and treatment plan with the patient. The patient was provided an opportunity to ask questions and all were answered. The patient agreed with the plan and demonstrated an understanding of the instructions.   The patient was advised to call back or seek an in-person evaluation if the symptoms worsen or if the condition fails to improve as anticipated.    Elby Beck, FNP

## 2019-11-25 ENCOUNTER — Telehealth: Payer: Self-pay | Admitting: Family Medicine

## 2019-11-25 NOTE — Telephone Encounter (Signed)
Called pt and advised of msg from [employer].  Pt verbalized understanding.

## 2019-11-25 NOTE — Telephone Encounter (Signed)
Please call patient and let her know that I did some further investigation into screening MRI for dense breast tissue and the current guidelines do not recommend it. MRI for screening is used with certain genetic mutations and family history.

## 2020-02-21 ENCOUNTER — Telehealth: Payer: Self-pay

## 2020-02-21 NOTE — Telephone Encounter (Signed)
Received fax from [facility] stating that patient was requesting her prescription of sertraline 50 mg to be increased to 100 mg. Called patient to verify this. Patient denied wanting to increase dose and stated that she was doing fine for her current dose. Informed patient that she was prescribed a year supply of the medication on 11/24/2019 and that she would need to contact her pharmacy for refills on the medication. Patient verbalized understanding.

## 2020-02-21 NOTE — Telephone Encounter (Signed)
Caryl Pina from [facility] left a voicemail stating that she had faxed over a request for a new script for patient's Sertraline to be increased to 100 mg. Caryl Pina at [facility] requested a call back regarding this.

## 2020-02-22 NOTE — Telephone Encounter (Signed)
Called and spoke with pharmacy staff member Harmon Pier and explained the situation. Harmon Pier stated that she would inform Caryl Pina of this information.

## 2020-03-07 ENCOUNTER — Other Ambulatory Visit: Payer: Self-pay | Admitting: *Deleted

## 2020-03-07 DIAGNOSIS — F32A Depression, unspecified: Secondary | ICD-10-CM

## 2020-03-07 NOTE — Telephone Encounter (Signed)
Last office visit 11/24/2019 with Glenda Chroman for Anxiety and depression.  Last refilled 06/02/2019 for #90 with 2 refills.  No TOC appointment.

## 2020-03-08 MED ORDER — BUPROPION HCL ER (XL) 150 MG PO TB24: 150.0000 mg | ORAL_TABLET | Freq: Every day | ORAL | 0 refills | Status: DC

## 2020-03-08 NOTE — Telephone Encounter (Signed)
Sent. Thanks.  Reasonable for TOC later this year when possible (around May or June), sooner if needed.

## 2020-03-24 ENCOUNTER — Other Ambulatory Visit: Payer: Self-pay | Admitting: Ophthalmology

## 2020-03-24 ENCOUNTER — Other Ambulatory Visit: Payer: Self-pay | Admitting: Obstetrics

## 2020-03-24 DIAGNOSIS — Z1231 Encounter for screening mammogram for malignant neoplasm of breast: Secondary | ICD-10-CM

## 2020-05-08 ENCOUNTER — Other Ambulatory Visit: Payer: Self-pay

## 2020-05-08 NOTE — Telephone Encounter (Signed)
Pharmacy requests refill on: Valacyclovir 500 mg   LAST REFILL: 07/27/2019 (Q-18, R-2) LAST OV: 11/24/2019 NEXT OV: Not Scheduled  PHARMACY: [place], [place]

## 2020-05-09 MED ORDER — VALACYCLOVIR HCL 500 MG PO TABS: ORAL_TABLET | ORAL | 2 refills | Status: DC

## 2020-05-17 ENCOUNTER — Other Ambulatory Visit: Payer: Self-pay

## 2020-05-17 ENCOUNTER — Ambulatory Visit
Admission: RE | Admit: 2020-05-17 | Discharge: 2020-05-17 | Disposition: A | Discharge status: Home / Self Care | Payer: Self-pay | Source: Ambulatory Visit | Attending: Obstetrics | Admitting: Obstetrics

## 2020-05-17 DIAGNOSIS — Z1231 Encounter for screening mammogram for malignant neoplasm of breast: Secondary | ICD-10-CM | POA: Diagnosis not present

## 2020-06-02 ENCOUNTER — Other Ambulatory Visit: Payer: Self-pay | Admitting: Family Medicine

## 2020-06-02 DIAGNOSIS — F32A Depression, unspecified: Secondary | ICD-10-CM

## 2020-06-20 ENCOUNTER — Other Ambulatory Visit: Payer: Self-pay | Admitting: Family

## 2020-06-20 DIAGNOSIS — F32A Depression, unspecified: Secondary | ICD-10-CM

## 2020-08-14 ENCOUNTER — Other Ambulatory Visit: Payer: Self-pay | Admitting: *Deleted

## 2020-08-14 DIAGNOSIS — F32A Depression, unspecified: Secondary | ICD-10-CM

## 2020-08-14 NOTE — Telephone Encounter (Signed)
Caryl Pina at [facility] left a voicemail stating that they are trying to get a refill on Bupropion  Last refill 06/02/20 #90 Last office visit 12/02/19 video

## 2020-08-15 ENCOUNTER — Encounter: Payer: Self-pay | Admitting: Family Medicine

## 2020-08-15 ENCOUNTER — Other Ambulatory Visit: Payer: Self-pay

## 2020-08-15 ENCOUNTER — Ambulatory Visit (INDEPENDENT_AMBULATORY_CARE_PROVIDER_SITE_OTHER): Payer: Medicare Other | Admitting: Family Medicine

## 2020-08-15 VITALS — BP 120/68 | HR 77 | Temp 97.6°F | Ht 60.0 in | Wt 138.3 lb

## 2020-08-15 DIAGNOSIS — F419 Anxiety disorder, unspecified: Secondary | ICD-10-CM | POA: Diagnosis not present

## 2020-08-15 DIAGNOSIS — Z800 Family history of malignant neoplasm of digestive organs: Secondary | ICD-10-CM | POA: Diagnosis not present

## 2020-08-15 DIAGNOSIS — F32A Depression, unspecified: Secondary | ICD-10-CM

## 2020-08-15 MED ORDER — BUPROPION HCL ER (XL) 150 MG PO TB24: 150.0000 mg | ORAL_TABLET | Freq: Every day | ORAL | 3 refills | Status: DC

## 2020-08-15 MED ORDER — BUPROPION HCL ER (XL) 150 MG PO TB24: 150.0000 mg | ORAL_TABLET | Freq: Every day | ORAL | 0 refills | Status: DC

## 2020-08-15 MED ORDER — SERTRALINE HCL 50 MG PO TABS: 50.0000 mg | ORAL_TABLET | Freq: Every day | ORAL | 3 refills | Status: DC

## 2020-08-15 NOTE — Progress Notes (Signed)
Patient ID: Nicole Calhoun, female    DOB: May 26, 1955, 65 y.o.   MRN: [id number]  This visit was conducted in person.  BP 120/68   Pulse 77   Temp 97.6 F (36.4 C) (Temporal)   Ht 5' (1.524 m)   Wt 138 lb 5 oz (62.7 kg)   SpO2 97%   BMI 27.01 kg/m    CC: med refill visit  Subjective:   HPI: Nicole Calhoun is a 65 y.o. female presenting on 08/15/2020 for Medication Refill (Requests refill for Wellbutrin. )   Anxiety/Depression - long term wellbutrin XR '150mg'$  dose, sertraline '50mg'$  started at last physical 09/2019. Vivid dream side effect overall stable. No headaches or nausea/GI upset. Tolerating wellbutrin well without restless energy. No SI/HI.   Chronic back pain on disability.   Preventative:  Colonoscopy 07/2017 WNL, diverticulosis, likely rpt 5 yrs given fmhx colon cancer (father) ([facility]) Mammogram 05/2020 [facility] - last pap 07/2018 - WNL, atrophy of endocervical zone      Relevant past medical, surgical, family and social history reviewed and updated as indicated. Interim medical history since our last visit reviewed. Allergies and medications reviewed and updated. Outpatient Medications Prior to Visit  Medication Sig Dispense Refill   Calcium-Magnesium-Vitamin D (CALCIUM MAGNESIUM PO) Take by mouth.     cyclobenzaprine (FLEXERIL) 5 MG tablet TAKE 1 TABLET (5 MG TOTAL) BY MOUTH 2 (TWO) TIMES DAILY. *PA REQUIRED FOR WORKERS COMP - WCB*     ibuprofen (ADVIL) 800 MG tablet TAKE 1 TABLET (800 MG TOTAL) BY MOUTH 3 (THREE) TIMES DAILY WITH MEALS.     valACYclovir (VALTREX) 500 MG tablet TAKE 1 TABLET BY MOUTH 2 TIMES DAILY FOR 3 DAYS WITH SYMPTOMS 18 tablet 2   buPROPion (WELLBUTRIN XL) 150 MG 24 hr tablet TAKE 1 TABLET BY MOUTH ONCE A DAY 90 tablet 0   sertraline (ZOLOFT) 50 MG tablet Take 1 tablet (50 mg total) by mouth daily. 90 tablet 3   cholecalciferol (VITAMIN D3) 25 MCG (1000 UT) tablet Take 1,000 Units by mouth daily.     No  facility-administered medications prior to visit.     Per HPI unless specifically indicated in ROS section below Review of Systems  Objective:  BP 120/68   Pulse 77   Temp 97.6 F (36.4 C) (Temporal)   Ht 5' (1.524 m)   Wt 138 lb 5 oz (62.7 kg)   SpO2 97%   BMI 27.01 kg/m   Wt Readings from Last 3 Encounters:  08/15/20 138 lb 5 oz (62.7 kg)  11/24/19 133 lb (60.3 kg)  09/27/19 141 lb 8 oz (64.2 kg)      Physical Exam Vitals and nursing note reviewed.  Constitutional:      Appearance: Normal appearance. She is not ill-appearing.  Neck:     Thyroid: No thyroid mass or thyromegaly.  Cardiovascular:     Rate and Rhythm: Normal rate and regular rhythm.     Pulses: Normal pulses.     Heart sounds: Normal heart sounds. No murmur heard. Pulmonary:     Effort: Pulmonary effort is normal. No respiratory distress.     Breath sounds: Normal breath sounds. No wheezing, rhonchi or rales.  Skin:    General: Skin is warm and dry.     Findings: No rash.  Neurological:     Mental Status: She is alert.  Psychiatric:        Mood and Affect: Mood normal.  Behavior: Behavior normal.      Results for orders placed or performed in visit on 09/22/19  CBC with Differential/Platelet  Result Value Ref Range   WBC 5.7 4.0 - 10.5 K/uL   RBC 4.27 3.87 - 5.11 Mil/uL   Hemoglobin 13.3 12.0 - 15.0 g/dL   HCT 39.1 36.0 - 46.0 %   MCV 91.4 78.0 - 100.0 fl   MCHC 34.1 30.0 - 36.0 g/dL   RDW 13.0 11.5 - 15.5 %   Platelets 219.0 150.0 - 400.0 K/uL   Neutrophils Relative % 50.2 43.0 - 77.0 %   Lymphocytes Relative 39.7 12.0 - 46.0 %   Monocytes Relative 7.3 3.0 - 12.0 %   Eosinophils Relative 2.1 0.0 - 5.0 %   Basophils Relative 0.7 0.0 - 3.0 %   Neutro Abs 2.8 1.4 - 7.7 K/uL   Lymphs Abs 2.2 0.7 - 4.0 K/uL   Monocytes Absolute 0.4 0.1 - 1.0 K/uL   Eosinophils Absolute 0.1 0.0 - 0.7 K/uL   Basophils Absolute 0.0 0.0 - 0.1 K/uL  TSH  Result Value Ref Range   TSH 3.19 0.35 - 4.50  uIU/mL  Lipid panel  Result Value Ref Range   Cholesterol 178 0 - 200 mg/dL   Triglycerides 88.0 0.0 - 149.0 mg/dL   HDL 58.20 >39.00 mg/dL   VLDL 17.6 0.0 - 40.0 mg/dL   LDL Cholesterol 102 (H) 0 - 99 mg/dL   Total CHOL/HDL Ratio 3    NonHDL 120.08   Comprehensive metabolic panel  Result Value Ref Range   Sodium 142 135 - 145 mEq/L   Potassium 4.0 3.5 - 5.1 mEq/L   Chloride 104 96 - 112 mEq/L   CO2 34 (H) 19 - 32 mEq/L   Glucose, Bld 94 70 - 99 mg/dL   BUN 8 6 - 23 mg/dL   Creatinine, Ser 0.67 0.40 - 1.20 mg/dL   Total Bilirubin 0.6 0.2 - 1.2 mg/dL   Alkaline Phosphatase 59 39 - 117 U/L   AST 22 0 - 37 U/L   ALT 16 0 - 35 U/L   Total Protein 6.4 6.0 - 8.3 g/dL   Albumin 3.9 3.5 - 5.2 g/dL   GFR 88.70 >60.00 mL/min   Calcium 9.0 8.4 - 10.5 mg/dL  VITAMIN D 25 Hydroxy (Vit-D Deficiency, Fractures)  Result Value Ref Range   VITD 29.40 (L) 30.00 - 100.00 ng/mL   Depression screen [facility] 2/9 08/15/2020 09/27/2019 06/13/2017 03/23/2015  Decreased Interest 0 1 0 0  Down, Depressed, Hopeless 0 2 0 0  PHQ - 2 Score 0 3 0 0  Altered sleeping 0 3 - -  Tired, decreased energy 0 1 - -  Change in appetite 0 2 - -  Feeling bad or failure about yourself  0 2 - -  Trouble concentrating 0 1 - -  Moving slowly or fidgety/restless 0 1 - -  Suicidal thoughts 0 0 - -  PHQ-9 Score 0 13 - -  Difficult doing work/chores - Very difficult - -    GAD 7 : Generalized Anxiety Score 08/15/2020 09/27/2019  Nervous, Anxious, on Edge 0 2  Control/stop worrying 0 2  Worry too much - different things 0 2  Trouble relaxing 0 2  Restless 0 1  Easily annoyed or irritable 0 2  Afraid - awful might happen 0 2  Total GAD 7 Score 0 13  Anxiety Difficulty - Very difficult   Assessment & Plan:  This visit occurred during  the SARS-CoV-2 public health emergency.  Safety protocols were in place, including screening questions prior to the visit, additional usage of staff PPE, and extensive cleaning of exam room while  observing appropriate contact time as indicated for disinfecting solutions.   Problem List Items Addressed This Visit     Anxiety and depression - Primary    Stable period on wellbutrin XR '150mg'$  daily + sertraline '50mg'$  daily. Tolerating medications well, will refill x1 year supply. She will call back to schedule physical with one of our new providers in 6-9 months.        Relevant Medications   buPROPion (WELLBUTRIN XL) 150 MG 24 hr tablet   sertraline (ZOLOFT) 50 MG tablet   Family history of colon cancer in father     Meds ordered this encounter  Medications   buPROPion (WELLBUTRIN XL) 150 MG 24 hr tablet    Sig: Take 1 tablet (150 mg total) by mouth daily.    Dispense:  90 tablet    Refill:  3   sertraline (ZOLOFT) 50 MG tablet    Sig: Take 1 tablet (50 mg total) by mouth daily.    Dispense:  90 tablet    Refill:  3    No orders of the defined types were placed in this encounter.   Patient Instructions  You are doing well today  Sertraline and wellbutrin refilled for another year Schedule physical with one of our new providers in 6-9 months.   Follow up plan: Return in about 8 months (around 04/15/2021) for annual exam, prior fasting for blood work.  Ria Bush, MD

## 2020-08-15 NOTE — Patient Instructions (Signed)
You are doing well today  Sertraline and wellbutrin refilled for another year Schedule physical with one of our new providers in 6-9 months.

## 2020-08-15 NOTE — Assessment & Plan Note (Signed)
Stable period on wellbutrin XR '150mg'$  daily + sertraline '50mg'$  daily. Tolerating medications well, will refill x1 year supply. She will call back to schedule physical with one of our new providers in 6-9 months.

## 2020-11-20 DIAGNOSIS — D225 Melanocytic nevi of trunk: Secondary | ICD-10-CM | POA: Diagnosis not present

## 2020-11-20 DIAGNOSIS — B078 Other viral warts: Secondary | ICD-10-CM | POA: Diagnosis not present

## 2020-11-20 DIAGNOSIS — L821 Other seborrheic keratosis: Secondary | ICD-10-CM | POA: Diagnosis not present

## 2020-11-20 DIAGNOSIS — L570 Actinic keratosis: Secondary | ICD-10-CM | POA: Diagnosis not present

## 2020-11-20 DIAGNOSIS — R238 Other skin changes: Secondary | ICD-10-CM | POA: Diagnosis not present

## 2020-11-20 DIAGNOSIS — L218 Other seborrheic dermatitis: Secondary | ICD-10-CM | POA: Diagnosis not present

## 2020-11-20 DIAGNOSIS — L814 Other melanin hyperpigmentation: Secondary | ICD-10-CM | POA: Diagnosis not present

## 2020-11-20 DIAGNOSIS — L812 Freckles: Secondary | ICD-10-CM | POA: Diagnosis not present

## 2020-11-20 DIAGNOSIS — Z08 Encounter for follow-up examination after completed treatment for malignant neoplasm: Secondary | ICD-10-CM | POA: Diagnosis not present

## 2020-11-20 DIAGNOSIS — Z85828 Personal history of other malignant neoplasm of skin: Secondary | ICD-10-CM | POA: Diagnosis not present

## 2021-01-18 ENCOUNTER — Ambulatory Visit (INDEPENDENT_AMBULATORY_CARE_PROVIDER_SITE_OTHER): Payer: Medicare Other | Admitting: Family

## 2021-01-18 ENCOUNTER — Encounter: Payer: Self-pay | Admitting: Family

## 2021-01-18 ENCOUNTER — Other Ambulatory Visit: Payer: Self-pay

## 2021-01-18 VITALS — BP 134/72 | HR 60 | Temp 96.9°F | Ht 60.0 in | Wt 146.0 lb

## 2021-01-18 DIAGNOSIS — N6011 Diffuse cystic mastopathy of right breast: Secondary | ICD-10-CM | POA: Diagnosis not present

## 2021-01-18 DIAGNOSIS — N6012 Diffuse cystic mastopathy of left breast: Secondary | ICD-10-CM | POA: Diagnosis not present

## 2021-01-18 DIAGNOSIS — N6321 Unspecified lump in the left breast, upper outer quadrant: Secondary | ICD-10-CM | POA: Diagnosis not present

## 2021-01-18 DIAGNOSIS — Z803 Family history of malignant neoplasm of breast: Secondary | ICD-10-CM

## 2021-01-18 DIAGNOSIS — N6311 Unspecified lump in the right breast, upper outer quadrant: Secondary | ICD-10-CM | POA: Insufficient documentation

## 2021-01-18 HISTORY — DX: Family history of malignant neoplasm of breast: Z80.3

## 2021-01-18 NOTE — Assessment & Plan Note (Addendum)
Diagnostic imaging (L breast dx mammo and u/s left breast with axilla) ordered for work-up pending results and scheduling.  I have discussed with patient to decrease her caffeine intake as this may aggravate fibrocystic breast densities as well.

## 2021-01-18 NOTE — Patient Instructions (Signed)
As discussed try to decrease caffeine as this may aggravate fibrocystic breasts.   A diagnostic test was ordered for today, and requested to be performed at [facility] [place] [facility]. I have sent the order over to the facility.  Please give this center a call, and schedule your appointment to have this test completed. Once results are received, I will be in touch.   It was a pleasure seeing you today! Please do not hesitate to reach out with any questions and or concerns.  Regards,   Eugenia Pancoast FNP-C

## 2021-01-18 NOTE — Assessment & Plan Note (Signed)
Diagnostic imaging (R breast dx mammo and R breast u/s with axilla) ordered for work-up pending results and scheduling.  I have discussed with patient to decrease her caffeine intake as this may aggravate fibrocystic breast densities as well.

## 2021-01-18 NOTE — Progress Notes (Signed)
Established Patient Office Visit  Subjective:  Patient ID: Nicole Calhoun, female    DOB: 05-13-1955  Age: 66 y.o. MRN: [id number]  CC:  Chief Complaint  Patient presents with   Breast Pain    Bilateral, has like a burning sensation     HPI Nicole Calhoun is here today with c/o right sided breast burning sensation that she describes as aching/burning within the breast. She states this comes and goes. Starts maybe two months ago. She thought at one point she felt a hard lump, but then checked again and couldn't palpate. When she touches her breasts at times it is tender, but not currently.  Denies nipple discharge.  . Much less, but left breast does have an occasional aching burning sensation. Denies chest pain, palpitations and or shortness of breath.   Reviewed mammogram 05/17/20 with no evidence of malignancy.  Breast cancer is in the family, MGM had breast cancer who passed at age 38 y/o.  Not sure if this is related or not, but she does have c/o four months ago starting with burning sensation mid to lower back, this also comes and goes. Does have neck pain, but currently sees massage therapist for this at least once monthly. She does have an appt with an orthopedist tomorrow for further evaluation into back pain, because also with chronic low back pain as well.   No known h/o shingles.   Past Medical History:  Diagnosis Date   Arthritis    "all over" (07/04/2104)   Chronic lower back pain    Coronary artery disease    Mild   Depression    Dysrhythmia    Family history of colon cancer in father 07/31/2017   Fibromyalgia    GERD (gastroesophageal reflux disease)    Hard of hearing    Hepatitis C    "I took the treatment and was cleared" (07/05/2014)   History of hiatal hernia    Myocardial infarction ([place])    "I was told I'd had a light heart attack one time; maybe 2001"   Pancreatic cyst    PONV (postoperative nausea and vomiting)    Sleep apnea    "have mask;  haven't worn it in years" (07/05/2014)    Past Surgical History:  Procedure Laterality Date   CARDIOVASCULAR STRESS TEST  06/08/2009   No scintigraphic evidence of inducible myocardial ischemia.   CATARACT EXTRACTION W/ INTRAOCULAR LENS  IMPLANT, BILATERAL Bilateral    CESAREAN SECTION  X 3   FOOT SURGERY Left 04/28/2012   EXCISION OF PLANTAR FIBROMA , PLANTAR ASPECT LEFT ARCH   GANGLION CYST EXCISION Bilateral    "2 on the right; 1 on the left"   ORIF HUMERUS FRACTURE Right 07/05/2014   Procedure: OPEN REDUCTION INTERNAL FIXATION (ORIF) PROXIMAL HUMERUS FRACTURE;  Surgeon: Tania Ade, MD;  Location: [facility];  Service: Orthopedics;  Laterality: Right;  Open reduction internal fixation right shoulder    ORIF PROXIMAL HUMERUS FRACTURE Right 07/05/2014   TRANSTHORACIC ECHOCARDIOGRAM  06/08/2009   EF >55%, normal LV systolic function    Family History  Problem Relation Age of Onset   Arthritis Mother    Heart disease Mother 44   Stroke Mother 35   Cancer Father        Colon cancer   Stroke Father 77    Social History   Socioeconomic History   Marital status: Married    Spouse name: Not on file   Number of children: Not on file  Years of education: Not on file   Highest education level: Not on file  Occupational History   Not on file  Tobacco Use   Smoking status: Former    Packs/day: 0.50    Years: 8.00    Pack years: 4.00    Types: Cigarettes   Smokeless tobacco: Never   Tobacco comments:    "quit smoking cigarettes in the 1980's"  Substance and Sexual Activity   Alcohol use: Yes    Comment: "used to drink when I was younger"   Drug use: No   Sexual activity: Yes  Other Topics Concern   Not on file  Social History Narrative   Not on file   Social Determinants of Health   Financial Resource Strain: Not on file  Food Insecurity: Not on file  Transportation Needs: Not on file  Physical Activity: Not on file  Stress: Not on file  Social Connections: Not on  file  Intimate Partner Violence: Not on file    Outpatient Medications Prior to Visit  Medication Sig Dispense Refill   buPROPion (WELLBUTRIN XL) 150 MG 24 hr tablet Take 1 tablet (150 mg total) by mouth daily. 90 tablet 3   Calcium-Magnesium-Vitamin D (CALCIUM MAGNESIUM PO) Take by mouth.     cyclobenzaprine (FLEXERIL) 5 MG tablet TAKE 1 TABLET (5 MG TOTAL) BY MOUTH 2 (TWO) TIMES DAILY. *PA REQUIRED FOR WORKERS COMP - WCB*     sertraline (ZOLOFT) 50 MG tablet Take 1 tablet (50 mg total) by mouth daily. 90 tablet 3   valACYclovir (VALTREX) 500 MG tablet TAKE 1 TABLET BY MOUTH 2 TIMES DAILY FOR 3 DAYS WITH SYMPTOMS 18 tablet 2   ibuprofen (ADVIL) 800 MG tablet TAKE 1 TABLET (800 MG TOTAL) BY MOUTH 3 (THREE) TIMES DAILY WITH MEALS.     buPROPion (WELLBUTRIN XL) 150 MG 24 hr tablet Take 1 tablet (150 mg total) by mouth daily. 90 tablet 0   No facility-administered medications prior to visit.    Allergies  Allergen Reactions   Codeine Nausea Only    ROS Review of Systems  Respiratory:  Negative for shortness of breath.   Cardiovascular:  Negative for chest pain and palpitations.  Breasts: right breast tenderness with burning sensation intermittently, palpable mass unable to pinpoint. Left breast also with tenderness however right > left. No nipple discharge . No breast rash.    Objective:    Physical Exam Constitutional:      General: She is not in acute distress.    Appearance: Normal appearance. She is not ill-appearing, toxic-appearing or diaphoretic.  HENT:     Head: Normocephalic.  Chest:     Chest wall: Mass present.  Breasts:    Right: Mass (right inner lower quadrant and right outer quadrant) and tenderness present. No bleeding, inverted nipple, nipple discharge or skin change.     Left: Mass (outer middle quadrant with tenderness) and tenderness present. No bleeding, inverted nipple, nipple discharge or skin change.    Lymphadenopathy:     Upper Body:     Right  upper body: Axillary adenopathy present.     Left upper body: Axillary adenopathy present.  Neurological:     Mental Status: She is alert.   Breasts: breasts appear normal, no suspicious masses, no skin or nipple changes or axillary nodes, abnormal mass palpable right outer breast with tenderness on palpation, small pinpoint tender mass palpated right breast inner lower quadrant as well. Left breast with palpable tender mass upper outer quadrant  BP 134/72    Pulse 60    Temp (!) 96.9 F (36.1 C) (Temporal)    Ht 5' (1.524 m)    Wt 146 lb (66.2 kg)    SpO2 100%    BMI 28.51 kg/m  Wt Readings from Last 3 Encounters:  01/18/21 146 lb (66.2 kg)  08/15/20 138 lb 5 oz (62.7 kg)  11/24/19 133 lb (60.3 kg)     Health Maintenance Due  Topic Date Due   Pneumonia Vaccine 69+ Years old (1 - PCV) Never done   HIV Screening  Never done   Zoster Vaccines- Shingrix (1 of 2) Never done   COVID-19 Vaccine (2 - Moderna risk series) 12/22/2019   INFLUENZA VACCINE  08/14/2020    There are no preventive care reminders to display for this patient.  Lab Results  Component Value Date   TSH 3.19 09/22/2019   Lab Results  Component Value Date   WBC 5.7 09/22/2019   HGB 13.3 09/22/2019   HCT 39.1 09/22/2019   MCV 91.4 09/22/2019   PLT 219.0 09/22/2019   Lab Results  Component Value Date   NA 142 09/22/2019   K 4.0 09/22/2019   CO2 34 (H) 09/22/2019   GLUCOSE 94 09/22/2019   BUN 8 09/22/2019   CREATININE 0.67 09/22/2019   BILITOT 0.6 09/22/2019   ALKPHOS 59 09/22/2019   AST 22 09/22/2019   ALT 16 09/22/2019   PROT 6.4 09/22/2019   ALBUMIN 3.9 09/22/2019   CALCIUM 9.0 09/22/2019   ANIONGAP 9 07/04/2014   GFR 88.70 09/22/2019   No results found for: HGBA1C    Assessment & Plan:   Problem List Items Addressed This Visit       Other   Mass of upper outer quadrant of right breast    Diagnostic imaging (R breast dx mammo and R breast u/s with axilla) ordered for work-up pending  results and scheduling.  I have discussed with patient to decrease her caffeine intake as this may aggravate fibrocystic breast densities as well.       Relevant Orders   MM DIAG BREAST TOMO BILATERAL   US BREAST LTD UNI RIGHT INC AXILLA   Mass of upper outer quadrant of left breast - Primary    Diagnostic imaging (L breast dx mammo and u/s left breast with axilla) ordered for work-up pending results and scheduling.  I have discussed with patient to decrease her caffeine intake as this may aggravate fibrocystic breast densities as well.       Relevant Orders   MM DIAG BREAST TOMO BILATERAL   US BREAST LTD UNI LEFT INC AXILLA   Family history of breast cancer    No orders of the defined types were placed in this encounter.   Follow-up: Return in about 3 months (around 04/18/2021) for for annual wellness exam.    Eugenia Pancoast, FNP

## 2021-01-19 DIAGNOSIS — M47816 Spondylosis without myelopathy or radiculopathy, lumbar region: Secondary | ICD-10-CM | POA: Diagnosis not present

## 2021-01-19 DIAGNOSIS — M5489 Other dorsalgia: Secondary | ICD-10-CM | POA: Diagnosis not present

## 2021-02-06 ENCOUNTER — Other Ambulatory Visit: Payer: Self-pay

## 2021-02-06 ENCOUNTER — Ambulatory Visit
Admission: RE | Admit: 2021-02-06 | Discharge: 2021-02-06 | Disposition: A | Discharge status: Home / Self Care | Payer: Medicare Other | Source: Ambulatory Visit | Attending: Family | Admitting: Family

## 2021-02-06 DIAGNOSIS — N6321 Unspecified lump in the left breast, upper outer quadrant: Secondary | ICD-10-CM | POA: Diagnosis not present

## 2021-02-06 DIAGNOSIS — N6311 Unspecified lump in the right breast, upper outer quadrant: Secondary | ICD-10-CM | POA: Insufficient documentation

## 2021-02-06 DIAGNOSIS — R922 Inconclusive mammogram: Secondary | ICD-10-CM | POA: Diagnosis not present

## 2021-02-06 DIAGNOSIS — N644 Mastodynia: Secondary | ICD-10-CM | POA: Diagnosis not present

## 2021-02-07 NOTE — Progress Notes (Signed)
Please call and let pt know that her mammogram was without suspicious findings.

## 2021-02-07 NOTE — Progress Notes (Signed)
Please call and let pt know no suspicious findings on bilateral mammogram

## 2021-03-05 ENCOUNTER — Telehealth: Payer: Self-pay | Admitting: *Deleted

## 2021-03-05 NOTE — Telephone Encounter (Signed)
PLEASE NOTE: All timestamps contained within this report are represented as [place] Standard Time. CONFIDENTIALTY NOTICE: This fax transmission is intended only for the addressee. It contains information that is legally privileged, confidential or otherwise protected from use or disclosure. If you are not the intended recipient, you are strictly prohibited from reviewing, disclosing, copying using or disseminating any of this information or taking any action in reliance on or regarding this information. If you have received this fax in error, please notify us immediately by telephone so that we can arrange for its return to [place]. Phone: [phone], Toll-Free: [phone], Fax: [phone] Page: 1 of 1 Call Id: [id number] [facility] Night - Client Nonclinical Telephone Record  [facility] Client [facility] Night - Client Client Site [place] Provider [place] - PHYSICIAN, NOT LISTED- MD Contact Type Call Who Is Calling Patient / Member / Family / Caregiver Caller Name Nicole Calhoun Phone Number [phone] Patient Name Nicole Calhoun Patient DOB 19-Oct-1955 Call Type Message Only Information Provided Reason for Call Request to Schedule Office Appointment Initial Comment Caller states she wants an appt in April with Tabitha. Patient request to speak to RN No Disp. Time Disposition Final User 03/04/2021 8:16:54 AM General Information Provided Yes Mable Paris Call Closed By: Mable Paris Transaction Date/Time: 03/04/2021 8:15:24 AM (ET)

## 2021-04-20 ENCOUNTER — Encounter: Payer: Medicare Other | Admitting: Family

## 2021-04-23 ENCOUNTER — Encounter: Payer: Self-pay | Admitting: Family

## 2021-04-23 ENCOUNTER — Ambulatory Visit (INDEPENDENT_AMBULATORY_CARE_PROVIDER_SITE_OTHER): Payer: Medicare Other | Admitting: Family

## 2021-04-23 VITALS — BP 136/66 | HR 73 | Temp 98.3°F | Resp 16 | Ht 60.0 in | Wt 150.3 lb

## 2021-04-23 DIAGNOSIS — E559 Vitamin D deficiency, unspecified: Secondary | ICD-10-CM | POA: Diagnosis not present

## 2021-04-23 DIAGNOSIS — Z8261 Family history of arthritis: Secondary | ICD-10-CM

## 2021-04-23 DIAGNOSIS — M2550 Pain in unspecified joint: Secondary | ICD-10-CM | POA: Diagnosis not present

## 2021-04-23 DIAGNOSIS — Z13220 Encounter for screening for lipoid disorders: Secondary | ICD-10-CM | POA: Diagnosis not present

## 2021-04-23 DIAGNOSIS — N644 Mastodynia: Secondary | ICD-10-CM

## 2021-04-23 DIAGNOSIS — Z23 Encounter for immunization: Secondary | ICD-10-CM | POA: Insufficient documentation

## 2021-04-23 DIAGNOSIS — M816 Localized osteoporosis [Lequesne]: Secondary | ICD-10-CM | POA: Insufficient documentation

## 2021-04-23 DIAGNOSIS — N6011 Diffuse cystic mastopathy of right breast: Secondary | ICD-10-CM

## 2021-04-23 DIAGNOSIS — N6311 Unspecified lump in the right breast, upper outer quadrant: Secondary | ICD-10-CM

## 2021-04-23 DIAGNOSIS — N6012 Diffuse cystic mastopathy of left breast: Secondary | ICD-10-CM

## 2021-04-23 HISTORY — DX: Family history of arthritis: Z82.61

## 2021-04-23 HISTORY — DX: Mastodynia: N64.4

## 2021-04-23 LAB — COMPREHENSIVE METABOLIC PANEL
ALT: 16 U/L (ref 0–35)
AST: 24 U/L (ref 0–37)
Albumin: 4.4 g/dL (ref 3.5–5.2)
Alkaline Phosphatase: 71 U/L (ref 39–117)
BUN: 14 mg/dL (ref 6–23)
CO2: 32 mEq/L (ref 19–32)
Calcium: 9.3 mg/dL (ref 8.4–10.5)
Chloride: 99 mEq/L (ref 96–112)
Creatinine, Ser: 0.7 mg/dL (ref 0.40–1.20)
GFR: 90.85 mL/min (ref >60.00)
Glucose, Bld: 82 mg/dL (ref 70–99)
Potassium: 4.2 mEq/L (ref 3.5–5.1)
Sodium: 138 mEq/L (ref 135–145)
Total Bilirubin: 0.6 mg/dL (ref 0.2–1.2)
Total Protein: 7.2 g/dL (ref 6.0–8.3)

## 2021-04-23 LAB — CBC WITH DIFFERENTIAL/PLATELET
Basophils Absolute: 0 10*3/uL (ref 0.0–0.1)
Basophils Relative: 0.6 % (ref 0.0–3.0)
Eosinophils Absolute: 0.1 10*3/uL (ref 0.0–0.7)
Eosinophils Relative: 1.3 % (ref 0.0–5.0)
HCT: 40.9 % (ref 36.0–46.0)
Hemoglobin: 13.6 g/dL (ref 12.0–15.0)
Lymphocytes Relative: 34.2 % (ref 12.0–46.0)
Lymphs Abs: 1.9 10*3/uL (ref 0.7–4.0)
MCHC: 33.3 g/dL (ref 30.0–36.0)
MCV: 91.1 fl (ref 78.0–100.0)
Monocytes Absolute: 0.4 10*3/uL (ref 0.1–1.0)
Monocytes Relative: 7.9 % (ref 3.0–12.0)
Neutro Abs: 3.1 10*3/uL (ref 1.4–7.7)
Neutrophils Relative %: 56 % (ref 43.0–77.0)
Platelets: 246 10*3/uL (ref 150.0–400.0)
RBC: 4.49 Mil/uL (ref 3.87–5.11)
RDW: 13.3 % (ref 11.5–15.5)
WBC: 5.6 10*3/uL (ref 4.0–10.5)

## 2021-04-23 LAB — LIPID PANEL
Cholesterol: 227 mg/dL — ABNORMAL HIGH (ref 0–200)
HDL: 72.9 mg/dL (ref >39.00)
LDL Cholesterol: 139 mg/dL — ABNORMAL HIGH (ref 0–99)
NonHDL: 153.71
Total CHOL/HDL Ratio: 3
Triglycerides: 75 mg/dL (ref 0.0–149.0)
VLDL: 15 mg/dL (ref 0.0–40.0)

## 2021-04-23 LAB — SEDIMENTATION RATE: Sed Rate: 20 mm/hr (ref 0–30)

## 2021-04-23 LAB — VITAMIN D 25 HYDROXY (VIT D DEFICIENCY, FRACTURES): VITD: 30.49 ng/mL (ref 30.00–100.00)

## 2021-04-23 MED ORDER — PNEUMOVAX 23 25 MCG/0.5ML IJ INJ: 0.5000 mL | INJECTION | Freq: Once | INTRAMUSCULAR | 0 refills | Status: DC

## 2021-04-23 NOTE — Progress Notes (Signed)
Established Patient Office Visit  Subjective:  Patient ID: Nicole Calhoun, female    DOB: 07-Oct-1955  Age: 66 y.o. MRN: [id number]  CC:  Chief Complaint  Patient presents with   Annual Exam    HPI Nicole Calhoun is here today for follow up.  Pt is with acute concerns.   Takes about one hour for am for stiffness to work out in joints, mostly left hip, bil hands and low back. Some pain on feet as well, but did have cyst taken out in the past mom did have rheumatoid arthritis.   She did have right breast tenderness, which is ongoing a little bit better however she does feel at times a knot still at the base of her right inner breast quadrant. They did diagnostic mammogram back in January 2023, which was not suspicious however I had ordered an ultrasound which was not done. She did have bil breast densities.   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 Maybe 1-2 a year.   Neck and back chronic muscle pains, takes flexeril prn, 5 mg. Finds she is taking it once nightly sometimes during the day depending on pain. She has DDD in her neck and back, and osteoporosis. She used to see neurosurgeon and they recommend surgery/rehab and or spinal injections. She has been doing exercises on her own with mild relief. She did do physical therapy in the past.   Osteoporosis: last bone density scan, on vitamin D and calcium.   Anxiety and depression: taking Wellbutrin 150 mg once daily as well as sertraline 50 mg once daily.      04/23/2021   10:03 AM 08/15/2020    2:22 PM 09/27/2019    3:41 PM  GAD 7 : Generalized Anxiety Score  Nervous, Anxious, on Edge 0 0 2  Control/stop worrying 0 0 2  Worry too much - different things 1 0 2  Trouble relaxing 1 0 2  Restless 0 0 1  Easily annoyed or irritable 0 0 2  Afraid - awful might happen 0 0 2  Total GAD 7 Score 2 0 13  Anxiety Difficulty Not difficult at all  Very difficult   Flowsheet Row Office Visit from 04/23/2021 in [facility] Total Score 0       Gaining weight, however states hasn't been as active. Going to start walking often throughout the week and working on diet.   Dermatology screening, overdue hasn't been there in over a year.   Colonoscopy: 08/2017 , repeat in 5 years.  Mammogram: 02/06/2021  Bone density: 02/2018 with osteoporosis  Pap: 07/31/2018, every three years, due in July   Past Medical History:  Diagnosis Date   Arthritis    "all over" (07/04/2104)   Chronic lower back pain    Closed fracture of proximal end of right humerus 06/21/2014   Coronary artery disease    Mild   Depression    Dysrhythmia    Family history of breast cancer 01/18/2021   Family history of colon cancer in father 07/31/2017   Fibromyalgia    GERD (gastroesophageal reflux disease)    Hard of hearing    Hepatitis C    "I took the treatment and was cleared" (07/05/2014)   History of hiatal hernia    Myocardial infarction ([facility])    "I was told I'd had a light heart attack one time; maybe 2001"   Pancreatic cyst    PONV (postoperative nausea and  vomiting)    Sleep apnea    "have mask; haven't worn it in years" (07/05/2014)    Past Surgical History:  Procedure Laterality Date   CARDIOVASCULAR STRESS TEST  06/08/2009   No scintigraphic evidence of inducible myocardial ischemia.   CATARACT EXTRACTION W/ INTRAOCULAR LENS  IMPLANT, BILATERAL Bilateral    CESAREAN SECTION  X 3   FOOT SURGERY Left 04/28/2012   EXCISION OF PLANTAR FIBROMA , PLANTAR ASPECT LEFT ARCH   GANGLION CYST EXCISION Bilateral    "2 on the right; 1 on the left"   ORIF HUMERUS FRACTURE Right 07/05/2014   Procedure: OPEN REDUCTION INTERNAL FIXATION (ORIF) PROXIMAL HUMERUS FRACTURE;  Surgeon: Jones Broom, MD;  Location: [facility];  Service: Orthopedics;  Laterality: Right;  Open reduction internal fixation right shoulder    ORIF PROXIMAL HUMERUS FRACTURE Right 07/05/2014   TRANSTHORACIC ECHOCARDIOGRAM  06/08/2009   EF >55%,  normal LV systolic function    Family History  Problem Relation Age of Onset   Arthritis Mother    Heart disease Mother 33   Stroke Mother 86   Colon cancer Father    Stroke Father 26   Breast cancer Maternal Grandmother 53   Brain cancer Cousin     Social History   Socioeconomic History   Marital status: Married    Spouse name: Not on file   Number of children: 2   Years of education: Not on file   Highest education level: Not on file  Occupational History   Not on file  Tobacco Use   Smoking status: Former    Packs/day: 0.50    Years: 8.00    Pack years: 4.00    Types: Cigarettes   Smokeless tobacco: Never   Tobacco comments:    "quit smoking cigarettes in the 1980's"  Vaping Use   Vaping Use: Never used  Substance and Sexual Activity   Alcohol use: Yes    Comment: "used to drink when I was younger"   Drug use: No   Sexual activity: Yes  Other Topics Concern   Not on file  Social History Narrative   Two boys    Married    Social Determinants of Corporate investment banker Strain: Not on file  Food Insecurity: Not on file  Transportation Needs: Not on file  Physical Activity: Not on file  Stress: Not on file  Social Connections: Not on file  Intimate Partner Violence: Not on file    Outpatient Medications Prior to Visit  Medication Sig Dispense Refill   buPROPion (WELLBUTRIN XL) 150 MG 24 hr tablet Take 1 tablet (150 mg total) by mouth daily. 90 tablet 3   Calcium-Magnesium-Vitamin D (CALCIUM MAGNESIUM PO) Take by mouth.     cyclobenzaprine (FLEXERIL) 5 MG tablet TAKE 1 TABLET (5 MG TOTAL) BY MOUTH 2 (TWO) TIMES DAILY. *PA REQUIRED FOR WORKERS COMP - WCB*     sertraline (ZOLOFT) 50 MG tablet Take 1 tablet (50 mg total) by mouth daily. 90 tablet 3   valACYclovir (VALTREX) 500 MG tablet TAKE 1 TABLET BY MOUTH 2 TIMES DAILY FOR 3 DAYS WITH SYMPTOMS 18 tablet 2   No facility-administered medications prior to visit.    Allergies  Allergen Reactions    Codeine Nausea Only    ROS Review of Systems  Constitutional:  Positive for unexpected weight change (weight gain). Negative for chills, fatigue and fever.  Respiratory:  Negative for cough and shortness of breath.   Cardiovascular:  Negative for chest  pain, palpitations and leg swelling.  Musculoskeletal:  Positive for arthralgias (with stiffiness >1 hour in the am on usual), myalgias (chronic muscle spasms due to cervicalgia) and neck pain (chronic).  Skin:        Right breast tenderness at times, no nipple changes or discharge, no rash on breast      Objective:    Physical Exam Vitals reviewed.  Constitutional:      General: She is not in acute distress.    Appearance: Normal appearance. She is not ill-appearing or toxic-appearing.  HENT:     Right Ear: Tympanic membrane normal.     Left Ear: Tympanic membrane normal.     Mouth/Throat:     Mouth: Mucous membranes are moist.     Pharynx: No pharyngeal swelling.     Tonsils: No tonsillar exudate.  Eyes:     Extraocular Movements: Extraocular movements intact.     Conjunctiva/sclera: Conjunctivae normal.     Pupils: Pupils are equal, round, and reactive to light.  Neck:     Thyroid: No thyroid mass.  Cardiovascular:     Rate and Rhythm: Normal rate and regular rhythm.  Pulmonary:     Effort: Pulmonary effort is normal.     Breath sounds: Normal breath sounds.  Musculoskeletal:        General: Normal range of motion.  Lymphadenopathy:     Cervical:     Right cervical: No superficial cervical adenopathy.    Left cervical: No superficial cervical adenopathy.  Skin:    General: Skin is warm.     Capillary Refill: Capillary refill takes less than 2 seconds.  Neurological:     General: No focal deficit present.     Mental Status: She is alert and oriented to person, place, and time.  Psychiatric:        Mood and Affect: Mood normal.        Behavior: Behavior normal.        Thought Content: Thought content normal.         Judgment: Judgment normal.   Breasts: breasts appear normal, no skin or nipple changes or axillary nodes, symmetric fibrous changes in both upper outer quadrants. With right breast  tenderness and density palpated right upper outer quadrant    BP 136/66   Pulse 73   Temp 98.3 F (36.8 C)   Resp 16   Ht 5' (1.524 m)   Wt 150 lb 5 oz (68.2 kg)   SpO2 98%   BMI 29.36 kg/m  Wt Readings from Last 3 Encounters:  04/23/21 150 lb 5 oz (68.2 kg)  01/18/21 146 lb (66.2 kg)  08/15/20 138 lb 5 oz (62.7 kg)     Health Maintenance Due  Topic Date Due   Zoster Vaccines- Shingrix (1 of 2) Never done    There are no preventive care reminders to display for this patient.  Lab Results  Component Value Date   TSH 3.19 09/22/2019   Lab Results  Component Value Date   WBC 5.6 04/23/2021   HGB 13.6 04/23/2021   HCT 40.9 04/23/2021   MCV 91.1 04/23/2021   PLT 246.0 04/23/2021   Lab Results  Component Value Date   NA 138 04/23/2021   K 4.2 04/23/2021   CO2 32 04/23/2021   GLUCOSE 82 04/23/2021   BUN 14 04/23/2021   CREATININE 0.70 04/23/2021   BILITOT 0.6 04/23/2021   ALKPHOS 71 04/23/2021   AST 24 04/23/2021   ALT 16 04/23/2021  PROT 7.2 04/23/2021   ALBUMIN 4.4 04/23/2021   CALCIUM 9.3 04/23/2021   ANIONGAP 9 07/04/2014   GFR 90.85 04/23/2021   Lab Results  Component Value Date   CHOL 227 (H) 04/23/2021   Lab Results  Component Value Date   HDL 72.90 04/23/2021   Lab Results  Component Value Date   LDLCALC 139 (H) 04/23/2021   Lab Results  Component Value Date   TRIG 75.0 04/23/2021   Lab Results  Component Value Date   CHOLHDL 3 04/23/2021   No results found for: HGBA1C    Assessment & Plan:   Problem List Items Addressed This Visit       Musculoskeletal and Integument   Localized osteoporosis without current pathological fracture    Bone density ordered pending results.  Work on [employer] bearing exercises as tolerated Calcium vitamin d daily If  worse after dexa, may consider fosamax      Relevant Orders   DG Bone Density   Comprehensive metabolic panel (Completed)     Other   Mass of upper outer quadrant of right breast    Reviewed mammogram Ordering breast US right breast pending results including axilla      Relevant Orders   US BREAST LTD UNI RIGHT INC AXILLA   Fibrocystic breast changes of both breasts    Watch caffeine intake as this can aggravate      Relevant Orders   US BREAST LTD UNI RIGHT INC AXILLA   Breast pain, right - Primary    Ordering u/s pending results      Relevant Orders   US BREAST LTD UNI RIGHT INC AXILLA   CBC with Differential/Platelet (Completed)   Encounter for administration of vaccine    pneumonococcal 23 administered.  vaccine administered in office Pt tolerated procedure well  Verbal consent obtained prior to administration  Handout given in regards to vaccination.        Relevant Orders   Pneumococcal polysaccharide vaccine 23-valent greater than or equal to 2yo subcutaneous/IM (Completed)   Family history of rheumatoid arthritis    Ana rf sed rate ordered pending results Pt also with symtpoms      Relevant Orders   Sedimentation rate (Completed)   Rheumatoid factor (Completed)   ANA   Polyarthralgia    Autoimmune lab work pending and ordered      Relevant Orders   Sedimentation rate (Completed)   Rheumatoid factor (Completed)   ANA   Screening for lipoid disorders    Lipid panel ordered pending results.        Relevant Orders   Lipid panel (Completed)   Vitamin D deficiency    Ordered vitamin d pending results.        Relevant Orders   VITAMIN D 25 Hydroxy (Vit-D Deficiency, Fractures) (Completed)    Meds ordered this encounter  Medications   DISCONTD: pneumococcal 23 valent vaccine (PNEUMOVAX 23) 25 MCG/0.5ML injection    Sig: Inject 0.5 mLs into the muscle once for 1 dose.    Dispense:  0.5 mL    Refill:  0    Order Specific Question:    Supervising Provider    Answer:   BEDSOLE, AMY E [[id number]]    Follow-up: No follow-ups on file.    Mort Sawyers, FNP

## 2021-04-23 NOTE — Patient Instructions (Addendum)
Call [facility]/[place] [facility] to schedule your breast ultrasound as well as your bone density as I have sent the electronic order to their facility.  ?Here is their number : [phone]  ? ?I recommend the shingles vaccine, but I recommend you go the pharmacy.  ? ?It was a pleasure seeing you today! Please do not hesitate to reach out with any questions and or concerns. ? ?Regards,  ? ?Kayleigh Broadwell ?FNP-C ? ? ?

## 2021-04-24 NOTE — Progress Notes (Signed)
Cholesterol has improved a bit, work on low cholesterol diet and exercise as tolerated. ?We can repeat in three months to hopefully see some improvement.  ? ?Vitamin d improved. If not taking daily vitamin D I would make sure taking 2000 IU D3.  ?

## 2021-04-25 ENCOUNTER — Telehealth: Payer: Self-pay

## 2021-04-25 NOTE — Assessment & Plan Note (Signed)
pneumonococcal 23 administered. ? vaccine administered in office ?Pt tolerated procedure well  ?Verbal consent obtained prior to administration  ?Handout given in regards to vaccination.  ? ?

## 2021-04-25 NOTE — Assessment & Plan Note (Signed)
Ordering u/s pending results ?

## 2021-04-25 NOTE — Assessment & Plan Note (Signed)
Ordered vitamin d pending results.   

## 2021-04-25 NOTE — Assessment & Plan Note (Signed)
Autoimmune lab work pending and ordered ?

## 2021-04-25 NOTE — Assessment & Plan Note (Signed)
Bone density ordered pending results.  ?Work on weight bearing exercises as tolerated ?Calcium vitamin d daily ?If worse after dexa, may consider fosamax ?

## 2021-04-25 NOTE — Assessment & Plan Note (Signed)
Watch caffeine intake as this can aggravate ?

## 2021-04-25 NOTE — Telephone Encounter (Signed)
F/u   Returning the [place] called from today

## 2021-04-25 NOTE — Assessment & Plan Note (Signed)
Lipid panel ordered pending results.   

## 2021-04-25 NOTE — Assessment & Plan Note (Signed)
Nicole Calhoun rf sed rate ordered pending results ?Pt also with symtpoms ?

## 2021-04-25 NOTE — Assessment & Plan Note (Signed)
Reviewed mammogram ?Ordering breast US right breast pending results including axilla ?

## 2021-04-26 ENCOUNTER — Other Ambulatory Visit: Payer: Self-pay | Admitting: Family

## 2021-04-26 DIAGNOSIS — N6314 Unspecified lump in the right breast, lower inner quadrant: Secondary | ICD-10-CM

## 2021-04-26 LAB — ANTI-NUCLEAR AB-TITER (ANA TITER): ANA Titer 1: 1:80 {titer} — ABNORMAL HIGH

## 2021-04-26 LAB — ANA: Anti Nuclear Antibody (ANA): POSITIVE — AB

## 2021-04-26 LAB — RHEUMATOID FACTOR: Rheumatoid fact SerPl-aCnc: <14 IU/mL (ref <14)

## 2021-04-27 ENCOUNTER — Telehealth: Payer: Self-pay

## 2021-04-27 NOTE — Telephone Encounter (Signed)
Called pt and clarified the doses. She also stated that where she is supposed to get a bone density test and the place is supposed to call [facility] in reference to having a ultrasound and bone density at the same time.  ?

## 2021-04-27 NOTE — Telephone Encounter (Signed)
-----   Message from Eugenia Pancoast, [place] sent at 04/27/2021  8:17 AM EDT ----- ?I only want her taking once a day not three times a day.  ?Vitamin D3, 2000 IU ONCE DAILY.  ?Please clarify with patient.  ? ?----- Message ----- ?From: Interface, [facility] ?Sent: 04/23/2021   2:57 PM EDT ?To: Eugenia Pancoast, FNP ? ? ?

## 2021-05-15 ENCOUNTER — Ambulatory Visit
Admission: RE | Admit: 2021-05-15 | Discharge: 2021-05-15 | Disposition: A | Discharge status: Home / Self Care | Payer: Medicare Other | Source: Ambulatory Visit | Attending: Family | Admitting: Family

## 2021-05-15 DIAGNOSIS — N6311 Unspecified lump in the right breast, upper outer quadrant: Secondary | ICD-10-CM | POA: Insufficient documentation

## 2021-05-15 DIAGNOSIS — M816 Localized osteoporosis [Lequesne]: Secondary | ICD-10-CM | POA: Diagnosis not present

## 2021-05-15 DIAGNOSIS — N6011 Diffuse cystic mastopathy of right breast: Secondary | ICD-10-CM | POA: Insufficient documentation

## 2021-05-15 DIAGNOSIS — N6314 Unspecified lump in the right breast, lower inner quadrant: Secondary | ICD-10-CM | POA: Insufficient documentation

## 2021-05-15 DIAGNOSIS — N6012 Diffuse cystic mastopathy of left breast: Secondary | ICD-10-CM | POA: Insufficient documentation

## 2021-05-15 DIAGNOSIS — N644 Mastodynia: Secondary | ICD-10-CM | POA: Diagnosis not present

## 2021-05-15 DIAGNOSIS — M810 Age-related osteoporosis without current pathological fracture: Secondary | ICD-10-CM | POA: Diagnosis not present

## 2021-05-15 DIAGNOSIS — N6489 Other specified disorders of breast: Secondary | ICD-10-CM | POA: Diagnosis not present

## 2021-05-15 DIAGNOSIS — R928 Other abnormal and inconclusive findings on diagnostic imaging of breast: Secondary | ICD-10-CM | POA: Diagnosis not present

## 2021-05-16 NOTE — Progress Notes (Signed)
Noted  

## 2021-05-16 NOTE — Progress Notes (Signed)
Right hip and right forearm with osteoporosis ?Has pt been taking anything for osteoporosis such as something called fosamax? Is she taking daily vitamin d and calcium?  ? ?If she is not or has not would she be willing to start mediation to help strengthen her bones from worsening?

## 2021-05-21 ENCOUNTER — Other Ambulatory Visit: Payer: Self-pay | Admitting: Family

## 2021-05-21 DIAGNOSIS — M816 Localized osteoporosis [Lequesne]: Secondary | ICD-10-CM

## 2021-05-21 MED ORDER — ALENDRONATE SODIUM 70 MG PO TABS: 70.0000 mg | ORAL_TABLET | ORAL | 11 refills | Status: DC

## 2021-05-21 NOTE — Progress Notes (Signed)
am

## 2021-05-21 NOTE — Progress Notes (Signed)
Please let pt know I have sent in fosamax advise pt to follow directions to include drinking with a full glass of water, and sitting upright for thirty minutes after taking.this helps her to avoid heartburn with medication. Otherwise well tolerated.

## 2021-05-21 NOTE — Progress Notes (Signed)
Sent fosamax to [facility]. ?Please advise pt to follow directions of administration completely

## 2021-05-22 ENCOUNTER — Other Ambulatory Visit: Payer: Self-pay | Admitting: Family

## 2021-05-22 DIAGNOSIS — M816 Localized osteoporosis [Lequesne]: Secondary | ICD-10-CM

## 2021-05-22 MED ORDER — ALENDRONATE SODIUM 70 MG PO TABS: 70.0000 mg | ORAL_TABLET | ORAL | 11 refills | Status: DC

## 2021-05-22 NOTE — Progress Notes (Signed)
Sent to [facility].  ?[facility], in the future when the request a different pharmacy you can put in the desired pharmacy change and pend the refill

## 2021-07-30 ENCOUNTER — Ambulatory Visit (INDEPENDENT_AMBULATORY_CARE_PROVIDER_SITE_OTHER)
Admission: RE | Admit: 2021-07-30 | Discharge: 2021-07-30 | Disposition: A | Discharge status: Home / Self Care | Payer: Medicare Other | Source: Ambulatory Visit | Attending: Family | Admitting: Family

## 2021-07-30 ENCOUNTER — Encounter: Payer: Self-pay | Admitting: Family

## 2021-07-30 ENCOUNTER — Ambulatory Visit (INDEPENDENT_AMBULATORY_CARE_PROVIDER_SITE_OTHER): Payer: Medicare Other | Admitting: Family

## 2021-07-30 VITALS — BP 136/66 | HR 83 | Temp 98.7°F | Resp 16 | Ht 60.0 in | Wt 148.1 lb

## 2021-07-30 DIAGNOSIS — E7800 Pure hypercholesterolemia, unspecified: Secondary | ICD-10-CM | POA: Diagnosis not present

## 2021-07-30 DIAGNOSIS — R102 Pelvic and perineal pain: Secondary | ICD-10-CM | POA: Insufficient documentation

## 2021-07-30 DIAGNOSIS — M899 Disorder of bone, unspecified: Secondary | ICD-10-CM

## 2021-07-30 DIAGNOSIS — Z8261 Family history of arthritis: Secondary | ICD-10-CM

## 2021-07-30 DIAGNOSIS — E559 Vitamin D deficiency, unspecified: Secondary | ICD-10-CM

## 2021-07-30 DIAGNOSIS — M2550 Pain in unspecified joint: Secondary | ICD-10-CM

## 2021-07-30 DIAGNOSIS — R768 Other specified abnormal immunological findings in serum: Secondary | ICD-10-CM | POA: Diagnosis not present

## 2021-07-30 HISTORY — DX: Disorder of bone, unspecified: M89.9

## 2021-07-30 NOTE — Assessment & Plan Note (Signed)
Pt not fasting today  Will return for fasting labs

## 2021-07-30 NOTE — Assessment & Plan Note (Signed)
Heat/ice to site.

## 2021-07-30 NOTE — Progress Notes (Signed)
Established Patient Office Visit  Subjective:  Patient ID: Nicole Calhoun, female    DOB: Apr 29, 1955  Age: 66 y.o. MRN: [id number]  CC:  Chief Complaint  Patient presents with   Annual Exam    HPI Nicole Calhoun is here today for follow up.   Hyperlipidemia: trying to cut back on higher cholesterol food items, but could do a little better. Does exercise, goal is for 10000 steps a day.   Vitamin d def: still taking this.   Osteoporosis: fosamax started back in may 2023. Tolerating well. Denies GI or GERD sx.   Breast tenderness has pretty much resolved.   Suprapubic tenderness burning in the groin area. Not constant intermittent. Can be exacerbated by movement but can also just be standing and will feel the sensation, can be sharp and stabbing. Has been going on for one year.  Denies urinary frequency. Thinks may be related to her back? She gets a massage once a month with mild relief. She did get some inguinal massage work and she had some relief but it was tender in her groin.  No vaginal discharge.   Does take a daily metamucil otherwise constipated. Pain doesn't seem to be   Associated.    Denies feeling bloated.   No nausea  Doesn't burp or have excessive gas.  Did have positive ana  Mom had rheumatoid arthritis  Stiffness in joints in hands especially worse in the am.   Colonoscopy 2019, due next year 2024   Past Medical History:  Diagnosis Date   Arthritis    "all over" (07/04/2104)   Breast pain, right 04/23/2021   Chronic lower back pain    Closed fracture of proximal end of right humerus 06/21/2014   Coronary artery disease    Mild   Depression    Dysrhythmia    Family history of breast cancer 01/18/2021   Family history of colon cancer in father 07/31/2017   Fibromyalgia    GERD (gastroesophageal reflux disease)    Hard of hearing    Hepatitis C    "I took the treatment and was cleared" (07/05/2014)   History of hiatal hernia    Myocardial infarction  ([place])    "I was told I'd had a light heart attack one time; maybe 2001"   Pancreatic cyst    PONV (postoperative nausea and vomiting)    Sleep apnea    "have mask; haven't worn it in years" (07/05/2014)    Past Surgical History:  Procedure Laterality Date   CARDIOVASCULAR STRESS TEST  06/08/2009   No scintigraphic evidence of inducible myocardial ischemia.   CATARACT EXTRACTION W/ INTRAOCULAR LENS  IMPLANT, BILATERAL Bilateral    CESAREAN SECTION  X 3   FOOT SURGERY Left 04/28/2012   EXCISION OF PLANTAR FIBROMA , PLANTAR ASPECT LEFT ARCH   GANGLION CYST EXCISION Bilateral    "2 on the right; 1 on the left"   ORIF HUMERUS FRACTURE Right 07/05/2014   Procedure: OPEN REDUCTION INTERNAL FIXATION (ORIF) PROXIMAL HUMERUS FRACTURE;  Surgeon: Tania Ade, MD;  Location: [facility];  Service: Orthopedics;  Laterality: Right;  Open reduction internal fixation right shoulder    ORIF PROXIMAL HUMERUS FRACTURE Right 07/05/2014   TRANSTHORACIC ECHOCARDIOGRAM  06/08/2009   EF >55%, normal LV systolic function    Family History  Problem Relation Age of Onset   Arthritis Mother    Heart disease Mother 33   Stroke Mother 67   Colon cancer Father    Stroke  Father 22   Breast cancer Maternal Grandmother 65   Brain cancer Cousin     Social History   Socioeconomic History   Marital status: Married    Spouse name: Not on file   Number of children: 2   Years of education: Not on file   Highest education level: Not on file  Occupational History   Not on file  Tobacco Use   Smoking status: Former    Packs/day: 0.50    Years: 8.00    Total pack years: 4.00    Types: Cigarettes   Smokeless tobacco: Never   Tobacco comments:    "quit smoking cigarettes in the 1980's"  Vaping Use   Vaping Use: Never used  Substance and Sexual Activity   Alcohol use: Yes    Comment: "used to drink when I was younger"   Drug use: No   Sexual activity: Yes  Other Topics Concern   Not on file  Social History  Narrative   Two boys    Married    Social Determinants of Radio broadcast assistant Strain: Not on file  Food Insecurity: Not on file  Transportation Needs: Not on file  Physical Activity: Not on file  Stress: Not on file  Social Connections: Not on file  Intimate Partner Violence: Not on file    Outpatient Medications Prior to Visit  Medication Sig Dispense Refill   alendronate (FOSAMAX) 70 MG tablet Take 1 tablet (70 mg total) by mouth every 7 (seven) days. Take with a full glass of water on an empty stomach. 4 tablet 11   buPROPion (WELLBUTRIN XL) 150 MG 24 hr tablet Take 1 tablet (150 mg total) by mouth daily. 90 tablet 3   Calcium-Magnesium-Vitamin D (CALCIUM MAGNESIUM PO) Take by mouth.     cyclobenzaprine (FLEXERIL) 5 MG tablet TAKE 1 TABLET (5 MG TOTAL) BY MOUTH 2 (TWO) TIMES DAILY. *PA REQUIRED FOR WORKERS COMP - WCB*     sertraline (ZOLOFT) 50 MG tablet Take 1 tablet (50 mg total) by mouth daily. 90 tablet 3   valACYclovir (VALTREX) 500 MG tablet TAKE 1 TABLET BY MOUTH 2 TIMES DAILY FOR 3 DAYS WITH SYMPTOMS 18 tablet 2   No facility-administered medications prior to visit.    Allergies  Allergen Reactions   Codeine Nausea Only        Objective:    Physical Exam Vitals reviewed.  Constitutional:      General: She is not in acute distress.    Appearance: Normal appearance. She is obese. She is not ill-appearing, toxic-appearing or diaphoretic.  HENT:     Mouth/Throat:     Pharynx: No pharyngeal swelling.     Tonsils: No tonsillar exudate.  Neck:     Thyroid: No thyroid mass.  Cardiovascular:     Rate and Rhythm: Normal rate and regular rhythm.  Pulmonary:     Effort: Pulmonary effort is normal.     Breath sounds: Normal breath sounds.  Abdominal:     General: Abdomen is flat. Bowel sounds are normal.     Palpations: Abdomen is soft.     Tenderness: There is abdominal tenderness (mild left lower quadrant tenderness).  Genitourinary:    Comments:  Pubic bone on left lower to mid side with tenderness on palpation Musculoskeletal:        General: Normal range of motion.  Lymphadenopathy:     Cervical:     Right cervical: No superficial cervical adenopathy.    Left cervical:  No superficial cervical adenopathy.     Comments: Left sided inguinal tenderness   Skin:    General: Skin is warm.     Capillary Refill: Capillary refill takes less than 2 seconds.  Neurological:     General: No focal deficit present.     Mental Status: She is alert and oriented to person, place, and time.  Psychiatric:        Mood and Affect: Mood normal.        Behavior: Behavior normal.        Thought Content: Thought content normal.        Judgment: Judgment normal.       BP 136/66   Pulse 83   Temp 98.7 F (37.1 C)   Resp 16   Ht 5' (1.524 m)   Wt 148 lb 2 oz (67.2 kg)   SpO2 98%   BMI 28.93 kg/m  Wt Readings from Last 3 Encounters:  07/30/21 148 lb 2 oz (67.2 kg)  04/23/21 150 lb 5 oz (68.2 kg)  01/18/21 146 lb (66.2 kg)     Health Maintenance Due  Topic Date Due   Zoster Vaccines- Shingrix (1 of 2) Never done   PAP SMEAR-Modifier  07/30/2021    There are no preventive care reminders to display for this patient.  Lab Results  Component Value Date   TSH 3.19 09/22/2019   Lab Results  Component Value Date   WBC 5.6 04/23/2021   HGB 13.6 04/23/2021   HCT 40.9 04/23/2021   MCV 91.1 04/23/2021   PLT 246.0 04/23/2021   Lab Results  Component Value Date   NA 138 04/23/2021   K 4.2 04/23/2021   CO2 32 04/23/2021   GLUCOSE 82 04/23/2021   BUN 14 04/23/2021   CREATININE 0.70 04/23/2021   BILITOT 0.6 04/23/2021   ALKPHOS 71 04/23/2021   AST 24 04/23/2021   ALT 16 04/23/2021   PROT 7.2 04/23/2021   ALBUMIN 4.4 04/23/2021   CALCIUM 9.3 04/23/2021   ANIONGAP 9 07/04/2014   GFR 90.85 04/23/2021   Lab Results  Component Value Date   CHOL 227 (H) 04/23/2021   Lab Results  Component Value Date   HDL 72.90 04/23/2021    Lab Results  Component Value Date   LDLCALC 139 (H) 04/23/2021   Lab Results  Component Value Date   TRIG 75.0 04/23/2021   Lab Results  Component Value Date   CHOLHDL 3 04/23/2021   No results found for: "HGBA1C"    Assessment & Plan:   Problem List Items Addressed This Visit       Other   Family history of rheumatoid arthritis   Relevant Orders   Ambulatory referral to Rheumatology   Polyarthralgia   Relevant Orders   Ambulatory referral to Rheumatology   Vitamin D deficiency   Relevant Orders   VITAMIN D 25 Hydroxy (Vit-D Deficiency, Fractures)   Pubic bone pain    Heat/ice to site.       Relevant Orders   US PELVIS (TRANSABDOMINAL ONLY)   DG Pelvis 1-2 Views   Pelvic pain    Will assess as this is pelvic vs pubic bone Lymph node vs bone? Osteoporosis?  Ordering pelvic xray and u/s pelvis to look into this further.  Work on stretches. Warmth and ice to site.       Relevant Orders   US PELVIS (TRANSABDOMINAL ONLY)   DG Pelvis 1-2 Views   Positive ANA (antinuclear antibody)  Along with polyarthralgia and positive ANA, will refer pt to rheumatologist for work up. Mom with fmh RA as well.       Relevant Orders   Ambulatory referral to Rheumatology   Elevated LDL cholesterol level - Primary    Pt not fasting today  Will return for fasting labs      Relevant Orders   Lipid panel    No orders of the defined types were placed in this encounter.   Follow-up: Return for schedule pap smear when able.    Eugenia Pancoast, FNP

## 2021-07-30 NOTE — Patient Instructions (Addendum)
A referral was placed today for rheumatology.  Please let us know if you have not heard back within 2 weeks about the referral.  Complete xray(s) prior to leaving today. I will notify you of your results once received.  Schedule pap smear for your three year pap, due anytime after today.    Due to recent changes in healthcare laws, you may see results of your imaging and/or laboratory studies on MyChart before I have had a chance to review them.  I understand that in some cases there may be results that are confusing or concerning to you. Please understand that not all results are received at the same time and often I may need to interpret multiple results in order to provide you with the best plan of care or course of treatment. Therefore, I ask that you please give me 2 business days to thoroughly review all your results before contacting my office for clarification. Should we see a critical lab result, you will be contacted sooner.   It was a pleasure seeing you today! Please do not hesitate to reach out with any questions and or concerns.  Regards,   Eugenia Pancoast FNP-C

## 2021-07-30 NOTE — Assessment & Plan Note (Signed)
Will assess as this is pelvic vs pubic bone Lymph node vs bone? Osteoporosis?  Ordering pelvic xray and u/s pelvis to look into this further.  Work on stretches. Warmth and ice to site.

## 2021-07-30 NOTE — Assessment & Plan Note (Signed)
Along with polyarthralgia and positive ANA, will refer pt to rheumatologist for work up. Mom with fmh RA as well.

## 2021-07-31 ENCOUNTER — Other Ambulatory Visit (INDEPENDENT_AMBULATORY_CARE_PROVIDER_SITE_OTHER): Payer: Medicare Other

## 2021-07-31 DIAGNOSIS — E7800 Pure hypercholesterolemia, unspecified: Secondary | ICD-10-CM | POA: Diagnosis not present

## 2021-07-31 DIAGNOSIS — E559 Vitamin D deficiency, unspecified: Secondary | ICD-10-CM

## 2021-07-31 LAB — LIPID PANEL
Cholesterol: 222 mg/dL — ABNORMAL HIGH (ref 0–200)
HDL: 67.3 mg/dL (ref >39.00)
LDL Cholesterol: 139 mg/dL — ABNORMAL HIGH (ref 0–99)
NonHDL: 154.8
Total CHOL/HDL Ratio: 3
Triglycerides: 80 mg/dL (ref 0.0–149.0)
VLDL: 16 mg/dL (ref 0.0–40.0)

## 2021-07-31 LAB — VITAMIN D 25 HYDROXY (VIT D DEFICIENCY, FRACTURES): VITD: 96.11 ng/mL (ref 30.00–100.00)

## 2021-08-01 NOTE — Progress Notes (Signed)
Vitamin d high, we don't want it this high. What is your vitamin d dosing?

## 2021-08-01 NOTE — Progress Notes (Signed)
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 Also suspected something resulting from possible injury, are you sure you didn't hit your pubic area at one point or have an injury?

## 2021-08-01 NOTE — Progress Notes (Signed)
5000 daily still too high.  Recommend 2000 IU once daily. No more than this.  High vitamin D can cause heart problems (please advise pt of this), we don't want int he 90's

## 2021-08-02 ENCOUNTER — Other Ambulatory Visit: Payer: Self-pay | Admitting: Family

## 2021-08-02 DIAGNOSIS — M19041 Primary osteoarthritis, right hand: Secondary | ICD-10-CM

## 2021-08-02 DIAGNOSIS — M899 Disorder of bone, unspecified: Secondary | ICD-10-CM

## 2021-08-02 MED ORDER — MELOXICAM 7.5 MG PO TABS: 7.5000 mg | ORAL_TABLET | Freq: Every day | ORAL | 0 refills | Status: DC

## 2021-08-02 NOTE — Progress Notes (Signed)
I am sending in meloxicam to take daily as this seems to be osteoarthritis. If this pain is ongoing over two weeks please schedule follow up to investigate further.

## 2021-08-06 ENCOUNTER — Ambulatory Visit
Admission: RE | Admit: 2021-08-06 | Discharge: 2021-08-06 | Disposition: A | Discharge status: Home / Self Care | Payer: Medicare Other | Source: Ambulatory Visit | Attending: Family | Admitting: Family

## 2021-08-06 DIAGNOSIS — M899 Disorder of bone, unspecified: Secondary | ICD-10-CM

## 2021-08-06 DIAGNOSIS — R102 Pelvic and perineal pain: Secondary | ICD-10-CM

## 2021-08-07 ENCOUNTER — Other Ambulatory Visit: Payer: Self-pay | Admitting: Family Medicine

## 2021-08-07 DIAGNOSIS — F419 Anxiety disorder, unspecified: Secondary | ICD-10-CM

## 2021-08-07 DIAGNOSIS — F32A Depression, unspecified: Secondary | ICD-10-CM

## 2021-08-09 ENCOUNTER — Other Ambulatory Visit: Payer: Self-pay | Admitting: Family

## 2021-08-09 NOTE — Progress Notes (Signed)
The ultrasound is limited, it does not show any true reason for concern of pain however with chronic pain in this area we could order a pelvic MRI.   If pt is interested please ask her: Does she have a pacemaker in place or anything metal in her body?  Could she do an MRI (aka does she need anti anxiety meds for this procedure)

## 2021-08-10 ENCOUNTER — Telehealth: Payer: Self-pay | Admitting: Family

## 2021-08-10 NOTE — Telephone Encounter (Signed)
Called office and sent fax and lab results.

## 2021-08-10 NOTE — Telephone Encounter (Signed)
Sandy called from [facility] called to get X-rays and labs. Call back number [phone] ext [phone].

## 2021-08-13 ENCOUNTER — Ambulatory Visit (INDEPENDENT_AMBULATORY_CARE_PROVIDER_SITE_OTHER): Payer: Medicare Other | Admitting: Family

## 2021-08-13 ENCOUNTER — Encounter: Payer: Self-pay | Admitting: Family

## 2021-08-13 ENCOUNTER — Other Ambulatory Visit (HOSPITAL_COMMUNITY)
Admission: RE | Admit: 2021-08-13 | Discharge: 2021-08-13 | Disposition: A | Discharge status: Home / Self Care | Payer: Medicare Other | Source: Ambulatory Visit | Attending: Family | Admitting: Family

## 2021-08-13 VITALS — BP 126/70 | HR 73 | Temp 97.9°F | Resp 16 | Ht 60.0 in | Wt 149.0 lb

## 2021-08-13 DIAGNOSIS — M899 Disorder of bone, unspecified: Secondary | ICD-10-CM | POA: Diagnosis not present

## 2021-08-13 DIAGNOSIS — Z124 Encounter for screening for malignant neoplasm of cervix: Secondary | ICD-10-CM | POA: Insufficient documentation

## 2021-08-13 DIAGNOSIS — F32A Depression, unspecified: Secondary | ICD-10-CM | POA: Diagnosis not present

## 2021-08-13 DIAGNOSIS — M2550 Pain in unspecified joint: Secondary | ICD-10-CM | POA: Diagnosis not present

## 2021-08-13 DIAGNOSIS — M810 Age-related osteoporosis without current pathological fracture: Secondary | ICD-10-CM | POA: Diagnosis not present

## 2021-08-13 DIAGNOSIS — F419 Anxiety disorder, unspecified: Secondary | ICD-10-CM

## 2021-08-13 DIAGNOSIS — Z1151 Encounter for screening for human papillomavirus (HPV): Secondary | ICD-10-CM | POA: Insufficient documentation

## 2021-08-13 DIAGNOSIS — Z01419 Encounter for gynecological examination (general) (routine) without abnormal findings: Secondary | ICD-10-CM | POA: Diagnosis not present

## 2021-08-13 DIAGNOSIS — M1610 Unilateral primary osteoarthritis, unspecified hip: Secondary | ICD-10-CM | POA: Diagnosis not present

## 2021-08-13 NOTE — Assessment & Plan Note (Signed)
Heat and rest to site with flares Elevate Continue meloxicam 7.5 mg once daily

## 2021-08-13 NOTE — Assessment & Plan Note (Signed)
Continue zoloft 50 mg and Wellbutrin 150 mg once daily

## 2021-08-13 NOTE — Assessment & Plan Note (Signed)
Unable to obtain MRI due to metal in body However likely r/t OA seen on left hip xray as moderate and has had some improvement with meloxicam. Advised pt to continue daily.

## 2021-08-13 NOTE — Progress Notes (Signed)
Established Patient Office Visit  Subjective:  Patient ID: Nicole Calhoun, female    DOB: [birth year]  Age: 66 y.o. MRN: [id number]  CC:  Chief Complaint  Patient presents with   Gynecologic Exam    HPI Nicole Calhoun is here today for an well woman exam.   Pt is without acute concerns.   Mammogram: dx mammogram 05/15/21 and u/s right bresat , negative, next due 02/06/2022 Last pap:   07/31/2018 negative for HPV and negative pap  Colonoscopy:07/31/2017, every five years.  Bone density scan: 03/13/2018 , osteoporosis  U/s pelvis: no suspicious concerns in the left groin area. Recommended if persistent pain to consider MRI pelvis or hip. Pt does have rods in the shoulder . Did have xray of hip also showing moderate pubic ostearthritis. Did start mobic 7.5 mg once daily with some mild improvement in the area of pain.   + ANA 1:80, h/o RA mom, polyarthralgia multiple joints. Does have OA. Moderate in lef pubic area. Meloxicam 7.5 mg helping slightly started last visit . Has appt with rheumatologist on Wednesday.   Last visit with right breast tenderness, ordered breast u/s right breast.   Pt doesn't recall she had abn pap in the past.  Last pap :  Aunt did have h/o ovarian cancer.   Past Medical History:  Diagnosis Date   Arthritis    "all over" (07/04/2104)   Breast pain, right 04/23/2021   Chronic lower back pain    Closed fracture of proximal end of right humerus 06/21/2014   Coronary artery disease    Mild   Depression    Dysrhythmia    Family history of breast cancer 01/18/2021   Family history of colon cancer in father 07/31/2017   Family history of rheumatoid arthritis 04/23/2021   Fibromyalgia    GERD (gastroesophageal reflux disease)    Hard of hearing    Hepatitis C    "I took the treatment and was cleared" (07/05/2014)   History of hiatal hernia    Myocardial infarction ([place])    "I was told I'd had a light heart attack one time; maybe 2001"   Pancreatic cyst     PONV (postoperative nausea and vomiting)    Sleep apnea    "have mask; haven't worn it in years" (07/05/2014)    Past Surgical History:  Procedure Laterality Date   CARDIOVASCULAR STRESS TEST  06/08/2009   No scintigraphic evidence of inducible myocardial ischemia.   CATARACT EXTRACTION W/ INTRAOCULAR LENS  IMPLANT, BILATERAL Bilateral    CESAREAN SECTION  X 3   FOOT SURGERY Left 04/28/2012   EXCISION OF PLANTAR FIBROMA , PLANTAR ASPECT LEFT ARCH   GANGLION CYST EXCISION Bilateral    "2 on the right; 1 on the left"   ORIF HUMERUS FRACTURE Right 07/05/2014   Procedure: OPEN REDUCTION INTERNAL FIXATION (ORIF) PROXIMAL HUMERUS FRACTURE;  Surgeon: Tania Ade, MD;  Location: [place];  Service: Orthopedics;  Laterality: Right;  Open reduction internal fixation right shoulder    ORIF PROXIMAL HUMERUS FRACTURE Right 07/05/2014   TRANSTHORACIC ECHOCARDIOGRAM  06/08/2009   EF >55%, normal LV systolic function    Family History  Problem Relation Age of Onset   Arthritis Mother    Heart disease Mother 64   Stroke Mother 77   Colon cancer Father    Stroke Father 27   Breast cancer Maternal Grandmother 47   Brain cancer Cousin     Social History   Socioeconomic History  Marital status: Married    Spouse name: Not on file   Number of children: 2   Years of education: Not on file   Highest education level: Not on file  Occupational History   Not on file  Tobacco Use   Smoking status: Former    Packs/day: 0.50    Years: 8.00    Total pack years: 4.00    Types: Cigarettes   Smokeless tobacco: Never   Tobacco comments:    "quit smoking cigarettes in the 1980's"  Vaping Use   Vaping Use: Never used  Substance and Sexual Activity   Alcohol use: Yes    Comment: "used to drink when I was younger"   Drug use: No   Sexual activity: Yes  Other Topics Concern   Not on file  Social History Narrative   Two boys    Married    Social Determinants of Radio broadcast assistant  Strain: Not on file  Food Insecurity: Not on file  Transportation Needs: Not on file  Physical Activity: Not on file  Stress: Not on file  Social Connections: Not on file  Intimate Partner Violence: Not on file    Outpatient Medications Prior to Visit  Medication Sig Dispense Refill   alendronate (FOSAMAX) 70 MG tablet Take 1 tablet (70 mg total) by mouth every 7 (seven) days. Take with a full glass of water on an empty stomach. 4 tablet 11   buPROPion (WELLBUTRIN XL) 150 MG 24 hr tablet TAKE 1 TABLET BY MOUTH ONCE A DAY 90 tablet 3   Calcium-Magnesium-Vitamin D (CALCIUM MAGNESIUM PO) Take by mouth.     cyclobenzaprine (FLEXERIL) 5 MG tablet TAKE 1 TABLET (5 MG TOTAL) BY MOUTH 2 (TWO) TIMES DAILY. *PA REQUIRED FOR WORKERS COMP - WCB*     meloxicam (MOBIC) 7.5 MG tablet Take 1 tablet (7.5 mg total) by mouth daily. 30 tablet 0   sertraline (ZOLOFT) 50 MG tablet Take 1 tablet (50 mg total) by mouth daily. 90 tablet 3   valACYclovir (VALTREX) 500 MG tablet TAKE 1 TABLET BY MOUTH 2 TIMES DAILY FOR 3 DAYS WITH SYMPTOMS 18 tablet 2   No facility-administered medications prior to visit.    Allergies  Allergen Reactions   Codeine Nausea Only    ROS Review of Systems  Review of Systems   Breasts: negative for nipple discharge, breast pain, breast mass, nipple changes, breast rash. Genitourinary:  Negative for decreased urine volume, difficulty urinating, dyspareunia, dysuria, flank pain, menstrual problem, pelvic pain, urgency, vaginal bleeding, vaginal discharge and vaginal pain.  Psychiatric/Behavioral:  Negative for depression and suicidal ideas.   All other systems reviewed and are negative.    Objective:    Physical Exam Constitutional:      General: She is not in acute distress.    Appearance: Normal appearance. She is normal weight. She is not ill-appearing, toxic-appearing or diaphoretic.  Cardiovascular:     Rate and Rhythm: Normal rate and regular rhythm.  Pulmonary:      Effort: Pulmonary effort is normal.     Breath sounds: Normal breath sounds.  Chest:  Breasts:    Tanner Score is 5.     Breasts are symmetrical.     Right: No swelling, bleeding, inverted nipple, mass, nipple discharge, skin change or tenderness.     Left: No swelling, bleeding, inverted nipple, mass, nipple discharge, skin change or tenderness.     Comments: Breast exam deferred Recent US and mammogram andbreast exam last  visit  Not with c/o today  Abdominal:     General: Abdomen is flat.  Genitourinary:    General: Normal vulva.     Pubic Area: No rash or pubic lice.      Labia:        Right: No rash, tenderness, lesion or injury.        Left: No rash, tenderness, lesion or injury.      Urethra: No prolapse, urethral pain or urethral lesion.     Vagina: No vaginal discharge or tenderness.     Cervix: Friability present. No discharge, erythema or cervical bleeding.     Adnexa:        Right: No mass, tenderness or fullness.         Left: No mass, tenderness or fullness.       Rectum: No external hemorrhoid.     Comments: Tenderness and suspected vaginal atrophy on cervix as with some vaginal thinning and irritation  Lymphadenopathy:     Upper Body:     Right upper body: No supraclavicular, axillary or pectoral adenopathy.     Left upper body: No supraclavicular, axillary or pectoral adenopathy.  Skin:    General: Skin is warm.  Neurological:     General: No focal deficit present.     Mental Status: She is alert and oriented to person, place, and time. Mental status is at baseline.  Psychiatric:        Mood and Affect: Mood normal.        Behavior: Behavior normal.        Thought Content: Thought content normal.        Judgment: Judgment normal.       BP 126/70   Pulse 73   Temp 97.9 F (36.6 C)   Resp 16   Ht 5' (1.524 m)   Wt 149 lb (67.6 kg)   SpO2 99%   BMI 29.10 kg/m  Wt Readings from Last 3 Encounters:  08/13/21 149 lb (67.6 kg)  07/30/21 148 lb 2 oz  (67.2 kg)  04/23/21 150 lb 5 oz (68.2 kg)     Health Maintenance Due  Topic Date Due   Zoster Vaccines- Shingrix (1 of 2) Never done   PAP SMEAR-Modifier  07/30/2021    There are no preventive care reminders to display for this patient.  Lab Results  Component Value Date   TSH 3.19 09/22/2019   Lab Results  Component Value Date   WBC 5.6 04/23/2021   HGB 13.6 04/23/2021   HCT 40.9 04/23/2021   MCV 91.1 04/23/2021   PLT 246.0 04/23/2021   Lab Results  Component Value Date   NA 138 04/23/2021   K 4.2 04/23/2021   CO2 32 04/23/2021   GLUCOSE 82 04/23/2021   BUN 14 04/23/2021   CREATININE 0.70 04/23/2021   BILITOT 0.6 04/23/2021   ALKPHOS 71 04/23/2021   AST 24 04/23/2021   ALT 16 04/23/2021   PROT 7.2 04/23/2021   ALBUMIN 4.4 04/23/2021   CALCIUM 9.3 04/23/2021   ANIONGAP 9 07/04/2014   GFR 90.85 04/23/2021   Lab Results  Component Value Date   CHOL 222 (H) 07/31/2021   Lab Results  Component Value Date   HDL 67.30 07/31/2021   Lab Results  Component Value Date   LDLCALC 139 (H) 07/31/2021   Lab Results  Component Value Date   TRIG 80.0 07/31/2021   Lab Results  Component Value Date   CHOLHDL 3 07/31/2021  No results found for: "HGBA1C"    Assessment & Plan:   Problem List Items Addressed This Visit       Musculoskeletal and Integument   Age-related osteoporosis without current pathological fracture    continue fosamax Ordering dexa as due for exam  Pending results      Relevant Orders   DG Bone Density   Osteoarthritis of pelvis - Primary    Heat and rest to site with flares Elevate Continue meloxicam 7.5 mg once daily        Other   Anxiety and depression    Continue zoloft 50 mg and Wellbutrin 150 mg once daily        Polyarthralgia    With positive ANA, raynauds, and mom with RA  Referred to rheumatologist for further eval/treat  appt in two days,  Keep as scheduled      Pubic bone pain    Unable to obtain MRI due  to metal in body However likely r/t OA seen on left hip xray as moderate and has had some improvement with meloxicam. Advised pt to continue daily.       Screening for cervical cancer    Pt verbalized consent for pelvic exam Declines std testing hpv thin prep ordered and pending results Pap exam in office completed, pt tolerated well.   Suspected vaginal atrophy May consider premarin pending results      Relevant Orders   Cytology - PAP    No orders of the defined types were placed in this encounter.   Follow-up: Return in about 6 months (around 02/13/2022) for regular follow up appt.    Eugenia Pancoast, FNP

## 2021-08-13 NOTE — Patient Instructions (Signed)
Call [facility] to schedule bone density.  Mammogram due again in 01/2021.   Due to recent changes in healthcare laws, you may see results of your imaging and/or laboratory studies on MyChart before I have had a chance to review them.  I understand that in some cases there may be results that are confusing or concerning to you. Please understand that not all results are received at the same time and often I may need to interpret multiple results in order to provide you with the best plan of care or course of treatment. Therefore, I ask that you please give me 2 business days to thoroughly review all your results before contacting my office for clarification. Should we see a critical lab result, you will be contacted sooner.   It was a pleasure seeing you today! Please do not hesitate to reach out with any questions and or concerns.  Regards,   Eugenia Pancoast FNP-C

## 2021-08-13 NOTE — Assessment & Plan Note (Signed)
With positive ANA, raynauds, and mom with RA  Referred to rheumatologist for further eval/treat  appt in two days,  Keep as scheduled

## 2021-08-13 NOTE — Assessment & Plan Note (Signed)
Pt verbalized consent for pelvic exam Declines std testing hpv thin prep ordered and pending results Pap exam in office completed, pt tolerated well.   Suspected vaginal atrophy May consider premarin pending results

## 2021-08-13 NOTE — Assessment & Plan Note (Signed)
continue fosamax Ordering dexa as due for exam  Pending results

## 2021-08-14 ENCOUNTER — Encounter: Payer: Self-pay | Admitting: Family

## 2021-08-14 ENCOUNTER — Other Ambulatory Visit: Payer: Self-pay | Admitting: Family

## 2021-08-14 DIAGNOSIS — N952 Postmenopausal atrophic vaginitis: Secondary | ICD-10-CM | POA: Insufficient documentation

## 2021-08-14 LAB — CYTOLOGY - PAP
Comment: NEGATIVE
Diagnosis: NEGATIVE
High risk HPV: NEGATIVE

## 2021-08-14 MED ORDER — PREMARIN 0.625 MG/GM VA CREA: 1.0000 | TOPICAL_CREAM | VAGINAL | 12 refills | Status: DC

## 2021-08-14 NOTE — Progress Notes (Signed)
Your recent pap smear was normal however did show vaginal atrophy as suspected which contributes to your pain and dryness.   I have sent in some cream you will apply vaginally two times a week and see if symptoms improve.

## 2021-08-27 ENCOUNTER — Telehealth: Payer: Self-pay | Admitting: Family

## 2021-08-27 NOTE — Telephone Encounter (Signed)
Patient called in and had questions regarding a [place] that was suppose to be put in for her and some new medication that she was suppose to receive. Stated she would like for somebody to give her a call to go over this. Thank you!

## 2021-08-27 NOTE — Telephone Encounter (Signed)
Kelly please call to get more information.

## 2021-08-28 NOTE — Telephone Encounter (Signed)
Called pt and informed her that her Rx was sent to the pharmacy on 8/3. I asked about the [place] and she said she is not sure what is was for. Just knows yall talked about one because of her vaginal dryness.

## 2021-08-30 ENCOUNTER — Other Ambulatory Visit: Payer: Self-pay | Admitting: Family

## 2021-08-30 DIAGNOSIS — M899 Disorder of bone, unspecified: Secondary | ICD-10-CM

## 2021-08-30 DIAGNOSIS — M19041 Primary osteoarthritis, right hand: Secondary | ICD-10-CM

## 2021-08-31 NOTE — Telephone Encounter (Signed)
Can we schedule her for a video visit? I need [place] to discuss further for clarification.

## 2021-09-03 NOTE — Telephone Encounter (Signed)
Called pt and made a mychart visit for 8/28

## 2021-09-10 ENCOUNTER — Encounter: Payer: Self-pay | Admitting: Family

## 2021-09-10 ENCOUNTER — Ambulatory Visit (INDEPENDENT_AMBULATORY_CARE_PROVIDER_SITE_OTHER): Payer: Medicare Other | Admitting: Family

## 2021-09-10 VITALS — BP 116/60 | HR 79 | Temp 98.3°F | Resp 16 | Ht 60.0 in | Wt 150.2 lb

## 2021-09-10 DIAGNOSIS — N952 Postmenopausal atrophic vaginitis: Secondary | ICD-10-CM | POA: Diagnosis not present

## 2021-09-10 DIAGNOSIS — E7800 Pure hypercholesterolemia, unspecified: Secondary | ICD-10-CM

## 2021-09-10 DIAGNOSIS — M810 Age-related osteoporosis without current pathological fracture: Secondary | ICD-10-CM

## 2021-09-10 DIAGNOSIS — R768 Other specified abnormal immunological findings in serum: Secondary | ICD-10-CM

## 2021-09-10 DIAGNOSIS — M1610 Unilateral primary osteoarthritis, unspecified hip: Secondary | ICD-10-CM

## 2021-09-10 MED ORDER — ESTRADIOL 0.1 MG/GM VA CREA: 1.0000 | TOPICAL_CREAM | VAGINAL | 12 refills | Status: AC

## 2021-09-10 NOTE — Assessment & Plan Note (Signed)
Low chol handout given to pt. Work on [employer] exercise as tolerated. Repeat lipid panel in six months. If no improvement consider statin.

## 2021-09-10 NOTE — Assessment & Plan Note (Signed)
Referral to rheumatology as scheduled for bil stiffness hands and pelvis, does have OA however. Continue meloxicam 7.5 mg once daily.

## 2021-09-10 NOTE — Assessment & Plan Note (Signed)
Improving with use of mobic 7.5 mg  Continue daily intake as prescribed

## 2021-09-10 NOTE — Assessment & Plan Note (Signed)
Continue fosamax

## 2021-09-10 NOTE — Progress Notes (Signed)
Established Patient Office Visit  Subjective:  Patient ID: Nicole Calhoun, female    DOB: July 20, 1955  Age: 66 y.o. MRN: [id number]  CC:  Chief Complaint  Patient presents with   Vaginal Pain    HPI RUTHELLEN TIPPY is here today for follow up.   Pt is with acute concerns.  Polyarthralgia: stiffness in hands in the am, stiffness for an hour or two. Started frankincense oil which has helped slightly. Also started with meloxicam 7.5 mg once daily. Does have pain in her pelvis, xray did show some OA here as well. Has improved with use of meloxicam.    Low positive ANA 1:80 last lab work.   Has been referred to rheumatology, pending appt next month for consult.   Pelvic pain: xray did show some osteoarthritis and a bony lesion on iliac crest with some sclerosis, with meloxicam has improved slightly. U/s was also preformed which was unremarkable, Mri was suggested if ongoing pain however pt with metal rods in right shoulder so this is not an option. With improvement in pain, 0/10 today.   Vaginal atrophy: atrophy noted on last pap 08/13/21. Pt has not picked up at pharmacy as of yet.   Elevated LDL: pt to work on low cholesterol diet.  Past Medical History:  Diagnosis Date   Arthritis    "all over" (07/04/2104)   Breast pain, right 04/23/2021   Chronic lower back pain    Closed fracture of proximal end of right humerus 06/21/2014   Coronary artery disease    Mild   Depression    Dysrhythmia    Family history of breast cancer 01/18/2021   Family history of colon cancer in father 07/31/2017   Family history of rheumatoid arthritis 04/23/2021   Fibromyalgia    GERD (gastroesophageal reflux disease)    Hard of hearing    Hepatitis C    "I took the treatment and was cleared" (07/05/2014)   History of hiatal hernia    Myocardial infarction ([place])    "I was told I'd had a light heart attack one time; maybe 2001"   Pancreatic cyst    PONV (postoperative nausea and vomiting)    Pubic  bone pain 07/30/2021   Sleep apnea    "have mask; haven't worn it in years" (07/05/2014)    Past Surgical History:  Procedure Laterality Date   CARDIOVASCULAR STRESS TEST  06/08/2009   No scintigraphic evidence of inducible myocardial ischemia.   CATARACT EXTRACTION W/ INTRAOCULAR LENS  IMPLANT, BILATERAL Bilateral    CESAREAN SECTION  X 3   FOOT SURGERY Left 04/28/2012   EXCISION OF PLANTAR FIBROMA , PLANTAR ASPECT LEFT ARCH   GANGLION CYST EXCISION Bilateral    "2 on the right; 1 on the left"   ORIF HUMERUS FRACTURE Right 07/05/2014   Procedure: OPEN REDUCTION INTERNAL FIXATION (ORIF) PROXIMAL HUMERUS FRACTURE;  Surgeon: Tania Ade, MD;  Location: [place];  Service: Orthopedics;  Laterality: Right;  Open reduction internal fixation right shoulder    ORIF PROXIMAL HUMERUS FRACTURE Right 07/05/2014   TRANSTHORACIC ECHOCARDIOGRAM  06/08/2009   EF >55%, normal LV systolic function    Family History  Problem Relation Age of Onset   Arthritis Mother    Heart disease Mother 27   Stroke Mother 50   Colon cancer Father    Stroke Father 1   Breast cancer Maternal Grandmother 62   Brain cancer Cousin     Social History   Socioeconomic History  Marital status: Married    Spouse name: Not on file   Number of children: 2   Years of education: Not on file   Highest education level: Not on file  Occupational History   Not on file  Tobacco Use   Smoking status: Former    Packs/day: 0.50    Years: 8.00    Total pack years: 4.00    Types: Cigarettes   Smokeless tobacco: Never   Tobacco comments:    "quit smoking cigarettes in the 1980's"  Vaping Use   Vaping Use: Never used  Substance and Sexual Activity   Alcohol use: Yes    Comment: "used to drink when I was younger"   Drug use: No   Sexual activity: Yes  Other Topics Concern   Not on file  Social History Narrative   Two boys    Married    Social Determinants of Radio broadcast assistant Strain: Not on file  Food  Insecurity: Not on file  Transportation Needs: Not on file  Physical Activity: Not on file  Stress: Not on file  Social Connections: Not on file  Intimate Partner Violence: Not on file    Outpatient Medications Prior to Visit  Medication Sig Dispense Refill   alendronate (FOSAMAX) 70 MG tablet Take 1 tablet (70 mg total) by mouth every 7 (seven) days. Take with a full glass of water on an empty stomach. 4 tablet 11   buPROPion (WELLBUTRIN XL) 150 MG 24 hr tablet TAKE 1 TABLET BY MOUTH ONCE A DAY 90 tablet 3   Calcium-Magnesium-Vitamin D (CALCIUM MAGNESIUM PO) Take by mouth.     cyclobenzaprine (FLEXERIL) 5 MG tablet TAKE 1 TABLET (5 MG TOTAL) BY MOUTH 2 (TWO) TIMES DAILY. *PA REQUIRED FOR WORKERS COMP - WCB*     meloxicam (MOBIC) 7.5 MG tablet TAKE 1 TABLET BY MOUTH EVERY DAY 30 tablet 0   sertraline (ZOLOFT) 50 MG tablet Take 1 tablet (50 mg total) by mouth daily. 90 tablet 3   valACYclovir (VALTREX) 500 MG tablet TAKE 1 TABLET BY MOUTH 2 TIMES DAILY FOR 3 DAYS WITH SYMPTOMS 18 tablet 2   conjugated estrogens (PREMARIN) vaginal cream Place 1 Applicatorful vaginally 2 (two) times a week. (Patient not taking: Reported on 09/10/2021) 42.5 g 12   No facility-administered medications prior to visit.    Allergies  Allergen Reactions   Codeine Nausea Only         Objective:    Physical Exam Constitutional:      Appearance: Normal appearance.  Cardiovascular:     Rate and Rhythm: Normal rate and regular rhythm.  Pulmonary:     Effort: Pulmonary effort is normal.  Neurological:     General: No focal deficit present.     Mental Status: She is alert and oriented to person, place, and time. Mental status is at baseline.  Psychiatric:        Mood and Affect: Mood normal.        Behavior: Behavior normal.        Thought Content: Thought content normal.        Judgment: Judgment normal.      BP 116/60   Pulse 79   Temp 98.3 F (36.8 C)   Resp 16   Ht 5' (1.524 m)   Wt 150  lb 4 oz (68.2 kg)   SpO2 98%   BMI 29.34 kg/m  Wt Readings from Last 3 Encounters:  09/10/21 150 lb 4 oz (68.2  kg)  08/13/21 149 lb (67.6 kg)  07/30/21 148 lb 2 oz (67.2 kg)     Health Maintenance Due  Topic Date Due   Zoster Vaccines- Shingrix (1 of 2) Never done   INFLUENZA VACCINE  08/14/2021    There are no preventive care reminders to display for this patient.  Lab Results  Component Value Date   TSH 3.19 09/22/2019   Lab Results  Component Value Date   WBC 5.6 04/23/2021   HGB 13.6 04/23/2021   HCT 40.9 04/23/2021   MCV 91.1 04/23/2021   PLT 246.0 04/23/2021   Lab Results  Component Value Date   NA 138 04/23/2021   K 4.2 04/23/2021   CO2 32 04/23/2021   GLUCOSE 82 04/23/2021   BUN 14 04/23/2021   CREATININE 0.70 04/23/2021   BILITOT 0.6 04/23/2021   ALKPHOS 71 04/23/2021   AST 24 04/23/2021   ALT 16 04/23/2021   PROT 7.2 04/23/2021   ALBUMIN 4.4 04/23/2021   CALCIUM 9.3 04/23/2021   ANIONGAP 9 07/04/2014   GFR 90.85 04/23/2021   Lab Results  Component Value Date   CHOL 222 (H) 07/31/2021   Lab Results  Component Value Date   HDL 67.30 07/31/2021   Lab Results  Component Value Date   LDLCALC 139 (H) 07/31/2021   Lab Results  Component Value Date   TRIG 80.0 07/31/2021   Lab Results  Component Value Date   CHOLHDL 3 07/31/2021   No results found for: "HGBA1C"    Assessment & Plan:   Problem List Items Addressed This Visit       Musculoskeletal and Integument   Age-related osteoporosis without current pathological fracture    Continue fosamax.        Osteoarthritis of pelvis    Improving with use of mobic 7.5 mg  Continue daily intake as prescribed        Genitourinary   Vaginal atrophy - Primary   Relevant Medications   estradiol (ESTRACE) 0.1 MG/GM vaginal cream     Other   Positive ANA (antinuclear antibody)    Referral to rheumatology as scheduled for bil stiffness hands and pelvis, does have OA however. Continue  meloxicam 7.5 mg once daily.        Elevated LDL cholesterol level    Low chol handout given to pt. Work on [employer] exercise as tolerated. Repeat lipid panel in six months. If no improvement consider statin.        Meds ordered this encounter  Medications   estradiol (ESTRACE) 0.1 MG/GM vaginal cream    Sig: Place 1 Applicatorful vaginally 2 (two) times a week.    Dispense:  42.5 g    Refill:  12    Order Specific Question:   Supervising Provider    Answer:   Diona Browner, AMY E [[id number]    Follow-up: Return in about 6 months (around 03/13/2022) for regular office visit .    Eugenia Pancoast, FNP

## 2021-09-10 NOTE — Patient Instructions (Addendum)
  For non -hormonal treatment of vaginal atrophy you can try the following over the counter recommendations, but may not be as helpful as estradiol cream.   ?Vaginal moisturizers are intended for use routinely, typically two or three days per week, not just during sexual activity. These products are typically bioadhesives. Hyaluronic acid is often used as a key ingredient in vaginal moisturizers. Many moisturizer products are available in pharmacies and online (eg, Replens, Vagisil Moisturizer, Feminease, Moist Again, K-Y Liquibeads, Hyalo GYN, Revaree suppositories)   ?Lubricants are used only at the time of sexual activity. Lubricants may be water-based (eg, Astroglide, Slippery Stuff, K-Y Jelly), silicone-based (eg, Pjur, ID Millennium), or oil-based (Elegance Women's Lubricant, Simply Slick)    Due to recent changes in healthcare laws, you may see results of your imaging and/or laboratory studies on MyChart before I have had a chance to review them.  I understand that in some cases there may be results that are confusing or concerning to you. Please understand that not all results are received at the same time and often I may need to interpret multiple results in order to provide you with the best plan of care or course of treatment. Therefore, I ask that you please give me 2 business days to thoroughly review all your results before contacting my office for clarification. Should we see a critical lab result, you will be contacted sooner.   It was a pleasure seeing you today! Please do not hesitate to reach out with any questions and or concerns.  Regards,   Eugenia Pancoast FNP-C

## 2021-10-01 DIAGNOSIS — M2560 Stiffness of unspecified joint, not elsewhere classified: Secondary | ICD-10-CM | POA: Diagnosis not present

## 2021-10-01 DIAGNOSIS — M79642 Pain in left hand: Secondary | ICD-10-CM | POA: Diagnosis not present

## 2021-10-01 DIAGNOSIS — M79671 Pain in right foot: Secondary | ICD-10-CM | POA: Diagnosis not present

## 2021-10-01 DIAGNOSIS — Z6827 Body mass index (BMI) 27.0-27.9, adult: Secondary | ICD-10-CM | POA: Diagnosis not present

## 2021-10-01 DIAGNOSIS — M2540 Effusion, unspecified joint: Secondary | ICD-10-CM | POA: Diagnosis not present

## 2021-10-01 DIAGNOSIS — E663 Overweight: Secondary | ICD-10-CM | POA: Diagnosis not present

## 2021-10-01 DIAGNOSIS — R768 Other specified abnormal immunological findings in serum: Secondary | ICD-10-CM | POA: Diagnosis not present

## 2021-10-01 DIAGNOSIS — M79641 Pain in right hand: Secondary | ICD-10-CM | POA: Diagnosis not present

## 2021-10-01 DIAGNOSIS — M79672 Pain in left foot: Secondary | ICD-10-CM | POA: Diagnosis not present

## 2021-10-09 ENCOUNTER — Other Ambulatory Visit: Payer: Self-pay | Admitting: Family

## 2021-10-09 DIAGNOSIS — M899 Disorder of bone, unspecified: Secondary | ICD-10-CM

## 2021-10-09 DIAGNOSIS — M19041 Primary osteoarthritis, right hand: Secondary | ICD-10-CM

## 2021-10-23 DIAGNOSIS — M2560 Stiffness of unspecified joint, not elsewhere classified: Secondary | ICD-10-CM | POA: Diagnosis not present

## 2021-10-23 DIAGNOSIS — M79671 Pain in right foot: Secondary | ICD-10-CM | POA: Diagnosis not present

## 2021-10-23 DIAGNOSIS — R768 Other specified abnormal immunological findings in serum: Secondary | ICD-10-CM | POA: Diagnosis not present

## 2021-10-23 DIAGNOSIS — M79641 Pain in right hand: Secondary | ICD-10-CM | POA: Diagnosis not present

## 2021-10-23 DIAGNOSIS — M2540 Effusion, unspecified joint: Secondary | ICD-10-CM | POA: Diagnosis not present

## 2021-10-23 DIAGNOSIS — M79642 Pain in left hand: Secondary | ICD-10-CM | POA: Diagnosis not present

## 2021-10-23 DIAGNOSIS — E663 Overweight: Secondary | ICD-10-CM | POA: Diagnosis not present

## 2021-10-23 DIAGNOSIS — M79672 Pain in left foot: Secondary | ICD-10-CM | POA: Diagnosis not present

## 2021-10-23 DIAGNOSIS — Z6827 Body mass index (BMI) 27.0-27.9, adult: Secondary | ICD-10-CM | POA: Diagnosis not present

## 2021-11-01 ENCOUNTER — Other Ambulatory Visit: Payer: Self-pay | Admitting: Family Medicine

## 2021-11-01 DIAGNOSIS — F32A Depression, unspecified: Secondary | ICD-10-CM

## 2021-11-01 DIAGNOSIS — F419 Anxiety disorder, unspecified: Secondary | ICD-10-CM

## 2021-11-05 ENCOUNTER — Other Ambulatory Visit: Payer: Self-pay

## 2021-11-05 DIAGNOSIS — F419 Anxiety disorder, unspecified: Secondary | ICD-10-CM

## 2021-11-05 DIAGNOSIS — F32A Depression, unspecified: Secondary | ICD-10-CM

## 2021-11-06 ENCOUNTER — Other Ambulatory Visit: Payer: Self-pay | Admitting: Family

## 2021-11-06 DIAGNOSIS — M899 Disorder of bone, unspecified: Secondary | ICD-10-CM

## 2021-11-06 DIAGNOSIS — M19041 Primary osteoarthritis, right hand: Secondary | ICD-10-CM

## 2021-11-06 MED ORDER — SERTRALINE HCL 50 MG PO TABS: 50.0000 mg | ORAL_TABLET | Freq: Every day | ORAL | 3 refills | Status: DC

## 2021-11-20 DIAGNOSIS — D492 Neoplasm of unspecified behavior of bone, soft tissue, and skin: Secondary | ICD-10-CM | POA: Diagnosis not present

## 2021-11-20 DIAGNOSIS — C44511 Basal cell carcinoma of skin of breast: Secondary | ICD-10-CM | POA: Diagnosis not present

## 2021-11-20 DIAGNOSIS — L814 Other melanin hyperpigmentation: Secondary | ICD-10-CM | POA: Diagnosis not present

## 2021-11-20 DIAGNOSIS — C44712 Basal cell carcinoma of skin of right lower limb, including hip: Secondary | ICD-10-CM | POA: Diagnosis not present

## 2021-11-20 DIAGNOSIS — B078 Other viral warts: Secondary | ICD-10-CM | POA: Diagnosis not present

## 2021-11-20 DIAGNOSIS — D225 Melanocytic nevi of trunk: Secondary | ICD-10-CM | POA: Diagnosis not present

## 2021-11-20 DIAGNOSIS — Z85828 Personal history of other malignant neoplasm of skin: Secondary | ICD-10-CM | POA: Diagnosis not present

## 2021-11-20 DIAGNOSIS — Z08 Encounter for follow-up examination after completed treatment for malignant neoplasm: Secondary | ICD-10-CM | POA: Diagnosis not present

## 2021-11-20 DIAGNOSIS — L821 Other seborrheic keratosis: Secondary | ICD-10-CM | POA: Diagnosis not present

## 2021-11-20 DIAGNOSIS — L538 Other specified erythematous conditions: Secondary | ICD-10-CM | POA: Diagnosis not present

## 2021-11-28 DIAGNOSIS — M79641 Pain in right hand: Secondary | ICD-10-CM | POA: Diagnosis not present

## 2021-11-28 DIAGNOSIS — M79642 Pain in left hand: Secondary | ICD-10-CM | POA: Diagnosis not present

## 2021-11-28 DIAGNOSIS — M2540 Effusion, unspecified joint: Secondary | ICD-10-CM | POA: Diagnosis not present

## 2021-11-28 DIAGNOSIS — M1991 Primary osteoarthritis, unspecified site: Secondary | ICD-10-CM | POA: Diagnosis not present

## 2021-11-28 DIAGNOSIS — R768 Other specified abnormal immunological findings in serum: Secondary | ICD-10-CM | POA: Diagnosis not present

## 2021-11-28 DIAGNOSIS — M79672 Pain in left foot: Secondary | ICD-10-CM | POA: Diagnosis not present

## 2021-11-28 DIAGNOSIS — M79671 Pain in right foot: Secondary | ICD-10-CM | POA: Diagnosis not present

## 2021-11-28 DIAGNOSIS — M2560 Stiffness of unspecified joint, not elsewhere classified: Secondary | ICD-10-CM | POA: Diagnosis not present

## 2021-11-28 DIAGNOSIS — E663 Overweight: Secondary | ICD-10-CM | POA: Diagnosis not present

## 2021-11-28 DIAGNOSIS — Z6827 Body mass index (BMI) 27.0-27.9, adult: Secondary | ICD-10-CM | POA: Diagnosis not present

## 2021-12-19 DIAGNOSIS — I872 Venous insufficiency (chronic) (peripheral): Secondary | ICD-10-CM | POA: Diagnosis not present

## 2021-12-19 DIAGNOSIS — D485 Neoplasm of uncertain behavior of skin: Secondary | ICD-10-CM | POA: Diagnosis not present

## 2021-12-19 DIAGNOSIS — C44712 Basal cell carcinoma of skin of right lower limb, including hip: Secondary | ICD-10-CM | POA: Diagnosis not present

## 2021-12-27 ENCOUNTER — Other Ambulatory Visit: Payer: Self-pay | Admitting: Family

## 2021-12-27 DIAGNOSIS — M19041 Primary osteoarthritis, right hand: Secondary | ICD-10-CM

## 2021-12-27 DIAGNOSIS — M19042 Primary osteoarthritis, left hand: Secondary | ICD-10-CM

## 2021-12-27 DIAGNOSIS — M899 Disorder of bone, unspecified: Secondary | ICD-10-CM

## 2022-01-16 DIAGNOSIS — C44511 Basal cell carcinoma of skin of breast: Secondary | ICD-10-CM | POA: Diagnosis not present

## 2022-01-29 ENCOUNTER — Telehealth: Payer: Self-pay | Admitting: Family

## 2022-01-29 NOTE — Telephone Encounter (Signed)
LVM for pt to rtn my call to schedule [facility] with [facility] call back # [phone]

## 2022-02-05 DIAGNOSIS — L821 Other seborrheic keratosis: Secondary | ICD-10-CM | POA: Diagnosis not present

## 2022-02-25 DIAGNOSIS — Z789 Other specified health status: Secondary | ICD-10-CM | POA: Diagnosis not present

## 2022-02-25 DIAGNOSIS — L814 Other melanin hyperpigmentation: Secondary | ICD-10-CM | POA: Diagnosis not present

## 2022-02-25 DIAGNOSIS — L820 Inflamed seborrheic keratosis: Secondary | ICD-10-CM | POA: Diagnosis not present

## 2022-02-25 DIAGNOSIS — L821 Other seborrheic keratosis: Secondary | ICD-10-CM | POA: Diagnosis not present

## 2022-02-25 DIAGNOSIS — L298 Other pruritus: Secondary | ICD-10-CM | POA: Diagnosis not present

## 2022-02-25 DIAGNOSIS — Z08 Encounter for follow-up examination after completed treatment for malignant neoplasm: Secondary | ICD-10-CM | POA: Diagnosis not present

## 2022-02-25 DIAGNOSIS — Z85828 Personal history of other malignant neoplasm of skin: Secondary | ICD-10-CM | POA: Diagnosis not present

## 2022-02-25 DIAGNOSIS — L538 Other specified erythematous conditions: Secondary | ICD-10-CM | POA: Diagnosis not present

## 2022-02-25 DIAGNOSIS — D225 Melanocytic nevi of trunk: Secondary | ICD-10-CM | POA: Diagnosis not present

## 2022-02-25 DIAGNOSIS — L218 Other seborrheic dermatitis: Secondary | ICD-10-CM | POA: Diagnosis not present

## 2022-03-26 DIAGNOSIS — M1712 Unilateral primary osteoarthritis, left knee: Secondary | ICD-10-CM | POA: Diagnosis not present

## 2022-03-26 DIAGNOSIS — M25462 Effusion, left knee: Secondary | ICD-10-CM | POA: Diagnosis not present

## 2022-03-26 DIAGNOSIS — G8929 Other chronic pain: Secondary | ICD-10-CM | POA: Diagnosis not present

## 2022-03-26 DIAGNOSIS — M25562 Pain in left knee: Secondary | ICD-10-CM | POA: Diagnosis not present

## 2022-03-28 ENCOUNTER — Other Ambulatory Visit: Payer: Self-pay | Admitting: Orthopedic Surgery

## 2022-03-28 DIAGNOSIS — M25562 Pain in left knee: Secondary | ICD-10-CM

## 2022-04-21 ENCOUNTER — Ambulatory Visit
Admission: RE | Admit: 2022-04-21 | Discharge: 2022-04-21 | Disposition: A | Discharge status: Home / Self Care | Payer: Medicare Other | Source: Ambulatory Visit | Attending: Orthopedic Surgery | Admitting: Orthopedic Surgery

## 2022-04-21 DIAGNOSIS — M25562 Pain in left knee: Secondary | ICD-10-CM | POA: Diagnosis not present

## 2022-04-29 DIAGNOSIS — S83242A Other tear of medial meniscus, current injury, left knee, initial encounter: Secondary | ICD-10-CM | POA: Diagnosis not present

## 2022-04-30 ENCOUNTER — Encounter: Payer: Medicare Other | Admitting: Family

## 2022-05-08 DIAGNOSIS — G8918 Other acute postprocedural pain: Secondary | ICD-10-CM | POA: Diagnosis not present

## 2022-05-08 DIAGNOSIS — Y999 Unspecified external cause status: Secondary | ICD-10-CM | POA: Diagnosis not present

## 2022-05-08 DIAGNOSIS — M2242 Chondromalacia patellae, left knee: Secondary | ICD-10-CM | POA: Diagnosis not present

## 2022-05-08 DIAGNOSIS — M659 Synovitis and tenosynovitis, unspecified: Secondary | ICD-10-CM | POA: Diagnosis not present

## 2022-05-08 DIAGNOSIS — X58XXXA Exposure to other specified factors, initial encounter: Secondary | ICD-10-CM | POA: Diagnosis not present

## 2022-05-08 DIAGNOSIS — M65862 Other synovitis and tenosynovitis, left lower leg: Secondary | ICD-10-CM | POA: Diagnosis not present

## 2022-05-08 DIAGNOSIS — S83232A Complex tear of medial meniscus, current injury, left knee, initial encounter: Secondary | ICD-10-CM | POA: Diagnosis not present

## 2022-05-08 DIAGNOSIS — M6752 Plica syndrome, left knee: Secondary | ICD-10-CM | POA: Diagnosis not present

## 2022-05-14 DIAGNOSIS — Z9889 Other specified postprocedural states: Secondary | ICD-10-CM | POA: Diagnosis not present

## 2022-06-05 DIAGNOSIS — Z9889 Other specified postprocedural states: Secondary | ICD-10-CM | POA: Diagnosis not present

## 2022-06-24 ENCOUNTER — Encounter: Payer: Self-pay | Admitting: Family

## 2022-06-24 ENCOUNTER — Ambulatory Visit (INDEPENDENT_AMBULATORY_CARE_PROVIDER_SITE_OTHER): Payer: Medicare Other | Admitting: Family

## 2022-06-24 VITALS — BP 128/66 | HR 77 | Temp 97.7°F | Ht 60.0 in | Wt 142.8 lb

## 2022-06-24 DIAGNOSIS — J301 Allergic rhinitis due to pollen: Secondary | ICD-10-CM

## 2022-06-24 DIAGNOSIS — L249 Irritant contact dermatitis, unspecified cause: Secondary | ICD-10-CM | POA: Diagnosis not present

## 2022-06-24 DIAGNOSIS — E559 Vitamin D deficiency, unspecified: Secondary | ICD-10-CM | POA: Diagnosis not present

## 2022-06-24 DIAGNOSIS — Z1231 Encounter for screening mammogram for malignant neoplasm of breast: Secondary | ICD-10-CM

## 2022-06-24 DIAGNOSIS — E7800 Pure hypercholesterolemia, unspecified: Secondary | ICD-10-CM

## 2022-06-24 DIAGNOSIS — Z1211 Encounter for screening for malignant neoplasm of colon: Secondary | ICD-10-CM

## 2022-06-24 DIAGNOSIS — Z23 Encounter for immunization: Secondary | ICD-10-CM

## 2022-06-24 DIAGNOSIS — Z0000 Encounter for general adult medical examination without abnormal findings: Secondary | ICD-10-CM | POA: Diagnosis not present

## 2022-06-24 DIAGNOSIS — R768 Other specified abnormal immunological findings in serum: Secondary | ICD-10-CM | POA: Insufficient documentation

## 2022-06-24 DIAGNOSIS — N952 Postmenopausal atrophic vaginitis: Secondary | ICD-10-CM | POA: Diagnosis not present

## 2022-06-24 DIAGNOSIS — M2560 Stiffness of unspecified joint, not elsewhere classified: Secondary | ICD-10-CM | POA: Insufficient documentation

## 2022-06-24 DIAGNOSIS — E782 Mixed hyperlipidemia: Secondary | ICD-10-CM

## 2022-06-24 DIAGNOSIS — M79609 Pain in unspecified limb: Secondary | ICD-10-CM | POA: Insufficient documentation

## 2022-06-24 DIAGNOSIS — Z85828 Personal history of other malignant neoplasm of skin: Secondary | ICD-10-CM | POA: Diagnosis not present

## 2022-06-24 DIAGNOSIS — R739 Hyperglycemia, unspecified: Secondary | ICD-10-CM | POA: Insufficient documentation

## 2022-06-24 LAB — LIPID PANEL
Cholesterol: 216 mg/dL — ABNORMAL HIGH (ref 0–200)
HDL: 63.5 mg/dL (ref >39.00)
LDL Cholesterol: 134 mg/dL — ABNORMAL HIGH (ref 0–99)
NonHDL: 152.05
Total CHOL/HDL Ratio: 3
Triglycerides: 89 mg/dL (ref 0.0–149.0)
VLDL: 17.8 mg/dL (ref 0.0–40.0)

## 2022-06-24 LAB — BASIC METABOLIC PANEL
BUN: 13 mg/dL (ref 6–23)
CO2: 30 mEq/L (ref 19–32)
Calcium: 9.4 mg/dL (ref 8.4–10.5)
Chloride: 98 mEq/L (ref 96–112)
Creatinine, Ser: 0.77 mg/dL (ref 0.40–1.20)
GFR: 80.37 mL/min (ref >60.00)
Glucose, Bld: 95 mg/dL (ref 70–99)
Potassium: 4.5 mEq/L (ref 3.5–5.1)
Sodium: 137 mEq/L (ref 135–145)

## 2022-06-24 LAB — VITAMIN D 25 HYDROXY (VIT D DEFICIENCY, FRACTURES): VITD: 51.07 ng/mL (ref 30.00–100.00)

## 2022-06-24 MED ORDER — DESONIDE 0.05 % EX CREA: TOPICAL_CREAM | Freq: Two times a day (BID) | CUTANEOUS | 0 refills | Status: AC

## 2022-06-24 NOTE — Patient Instructions (Signed)
  Get shingles vaccination at the pharmacy  Pneumonia given today in office.

## 2022-06-24 NOTE — Assessment & Plan Note (Signed)
Suspect from tanning lotion, advised pt to stop  Will rx desonide to see if improvement

## 2022-06-24 NOTE — Assessment & Plan Note (Signed)
improving

## 2022-06-24 NOTE — Assessment & Plan Note (Signed)
Patient Counseling(The following topics were reviewed): ? Preventative care handout given to pt  ?Health maintenance and immunizations reviewed. Please refer to Health maintenance section. ?Pt advised on safe sex, wearing seatbelts in car, and proper nutrition ?labwork ordered today for annual ?Dental health: Discussed importance of regular tooth brushing, flossing, and dental visits. ? ? ?

## 2022-06-24 NOTE — Progress Notes (Signed)
Subjective:  Patient ID: Nicole Calhoun, female    DOB: 1955-02-16  Age: 67 y.o. MRN: [id number]  Patient Care Team: Mort Sawyers, FNP as PCP - General (Family Medicine)   CC:  Chief Complaint  Patient presents with   Annual Exam    MWV CPE    HPI Nicole Calhoun is a 67 y.o. female who presents today for an annual physical exam. She reports consuming a general diet.  Was working on this however fell around May , knee gave way on the treadmill so right now healing.  She generally feels well. She reports sleeping fairly well. She does not have additional problems to discuss today.   Dental appt coming up as well as eye exam, does state overdue with these.     Mammogram: overdue however she states that she is going to schedule soon.  Last pap:  > 65 Colonoscopy: 2019  Bone density scan:2023, up to date. osteoporosis Shingles vaccination: open to getting this, will go to pharmacy per her insurance  Pt is with acute concerns.  Has noticed an increased dryness with slight itching. She does note she started some tanning cream in the last one week.    Sees dermatology, just had chest and right lower leg BCC removed.   Left knee arthroscopy, 5/23 doing well has been following up with general surgeon.   Advanced Directives Patient does not have advanced directives   DEPRESSION SCREENING    06/24/2022    9:43 AM 04/23/2021   10:02 AM 08/15/2020    2:22 PM 09/27/2019    3:40 PM 06/13/2017    8:30 AM 03/23/2015    1:14 PM  PHQ 2/9 Scores  PHQ - 2 Score 2 0 0 3 0 0  PHQ- 9 Score 7  0 13       ROS: Negative unless specifically indicated above in HPI.    Current Outpatient Medications:    buPROPion (WELLBUTRIN XL) 150 MG 24 hr tablet, TAKE 1 TABLET BY MOUTH ONCE A DAY, Disp: 90 tablet, Rfl: 3   Calcium-Magnesium-Vitamin D (CALCIUM MAGNESIUM PO), Take by mouth., Disp: , Rfl:    cyclobenzaprine (FLEXERIL) 5 MG tablet, TAKE 1 TABLET (5 MG TOTAL) BY MOUTH 2 (TWO) TIMES DAILY. *PA  REQUIRED FOR WORKERS COMP - WCB*, Disp: , Rfl:    desonide (DESOWEN) 0.05 % cream, Apply topically 2 (two) times daily., Disp: 30 g, Rfl: 0   estradiol (ESTRACE) 0.1 MG/GM vaginal cream, Place 1 Applicatorful vaginally 2 (two) times a week., Disp: 42.5 g, Rfl: 12   meloxicam (MOBIC) 7.5 MG tablet, TAKE 1 TABLET BY MOUTH ONCE DAILY, Disp: 30 tablet, Rfl: 0   sertraline (ZOLOFT) 50 MG tablet, Take 1 tablet (50 mg total) by mouth daily., Disp: 90 tablet, Rfl: 3   valACYclovir (VALTREX) 500 MG tablet, TAKE 1 TABLET BY MOUTH 2 TIMES DAILY FOR 3 DAYS WITH SYMPTOMS, Disp: 18 tablet, Rfl: 2    Objective:    BP 128/66   Pulse 77   Temp 97.7 F (36.5 C) (Temporal)   Ht 5' (1.524 m)   Wt 142 lb 12.8 oz (64.8 kg)   SpO2 98%   BMI 27.89 kg/m   BP Readings from Last 3 Encounters:  06/24/22 128/66  09/10/21 116/60  08/13/21 126/70      Physical Exam Constitutional:      General: She is not in acute distress.    Appearance: Normal appearance. She is normal weight. She is not ill-appearing.  HENT:     Head: Normocephalic.     Right Ear: Tympanic membrane normal.     Left Ear: Tympanic membrane normal.     Nose: Nose normal.     Mouth/Throat:     Mouth: Mucous membranes are moist.  Eyes:     Extraocular Movements: Extraocular movements intact.     Pupils: Pupils are equal, round, and reactive to light.  Cardiovascular:     Rate and Rhythm: Normal rate and regular rhythm.  Pulmonary:     Effort: Pulmonary effort is normal.     Breath sounds: Normal breath sounds.  Abdominal:     General: Abdomen is flat. Bowel sounds are normal.     Palpations: Abdomen is soft.     Tenderness: There is no guarding or rebound.  Musculoskeletal:        General: Normal range of motion.     Cervical back: Normal range of motion.  Skin:    General: Skin is warm.     Capillary Refill: Capillary refill takes less than 2 seconds.     Comments: Dry scaly skin on the anterior neck nontender with small very  slight papular lesions at base of neck   Neurological:     General: No focal deficit present.     Mental Status: She is alert.  Psychiatric:        Mood and Affect: Mood normal.        Behavior: Behavior normal.        Thought Content: Thought content normal.        Judgment: Judgment normal.          Assessment & Plan:  History of basal cell carcinoma  Screening mammogram for breast cancer -     3D Screening Mammogram, Left and Right; Future  Screening for colon cancer -     Ambulatory referral to Gastroenterology  Encounter for general adult medical examination without abnormal findings Assessment & Plan: Patient Counseling(The following topics were reviewed):  Preventative care handout given to pt  Health maintenance and immunizations reviewed. Please refer to Health maintenance section. Pt advised on safe sex, wearing seatbelts in car, and proper nutrition labwork ordered today for annual Dental health: Discussed importance of regular tooth brushing, flossing, and dental visits.   Orders: -     Basic metabolic panel -     Lipid panel  Seasonal allergic rhinitis due to pollen  Hyperglycemia  Mixed hyperlipidemia  Vitamin D deficiency -     VITAMIN D 25 Hydroxy (Vit-D Deficiency, Fractures)  Irritant dermatitis Assessment & Plan: Suspect from tanning lotion, advised pt to stop  Will rx desonide to see if improvement  Orders: -     Desonide; Apply topically 2 (two) times daily.  Dispense: 30 g; Refill: 0  Need for vaccination against Streptococcus pneumoniae -     Pneumococcal conjugate vaccine 20-valent  Elevated LDL cholesterol level Assessment & Plan: Ordered lipid panel, pending results. Work on low cholesterol diet and exercise as tolerated    Vaginal atrophy Assessment & Plan: improving       Follow-up: Return in about 1 year (around 06/24/2023) for f/u CPE.   Mort Sawyers, FNP

## 2022-06-24 NOTE — Assessment & Plan Note (Signed)
Ordered lipid panel, pending results. Work on low cholesterol diet and exercise as tolerated  

## 2022-06-25 ENCOUNTER — Telehealth: Payer: Self-pay

## 2022-06-25 NOTE — Telephone Encounter (Signed)
-----   Message from Mort Sawyers, [place] sent at 06/24/2022 10:07 AM EDT ----- Pt would like to start prolia has fosamax failure

## 2022-06-25 NOTE — Telephone Encounter (Signed)
Created new encounter for Prolia BIV. Will route encounter back once benefit verification is complete.  

## 2022-06-25 NOTE — Telephone Encounter (Signed)
Prolia VOB initiated via [email or web address] 

## 2022-06-25 NOTE — Telephone Encounter (Signed)
Please start prolia benefits/ Authorization

## 2022-06-25 NOTE — Progress Notes (Signed)
Cholesterol still elevated, consistent for about six years.  LDL is in the 130's and goal is less than 100's. Over time, the cholesterol will plaque and narrow the arteries increasing your risk for heart attack and or stroke.   I do highly suggest considering starting a statin to significantly reduce this risk, is pt ok with this?   Labs otherwise look good.

## 2022-06-27 ENCOUNTER — Telehealth: Payer: Self-pay | Admitting: Family

## 2022-06-27 NOTE — Telephone Encounter (Signed)
Information added to results notes. See result notes for further documentation.

## 2022-06-27 NOTE — Telephone Encounter (Signed)
Patient called in and relayed lab results to patient. She stated that she would like to go ahead and start a statin. She just wanted to know if this will be ok to take with her other medication. She stated that it could be sent in to [facility] - [place] ([place]), [place] - [address] Thank you!

## 2022-06-27 NOTE — Telephone Encounter (Signed)
Patient called in returning a call she received. Thank you! 

## 2022-06-27 NOTE — Telephone Encounter (Signed)
Information has been added to result note. Please see result note for further documentation.

## 2022-06-28 ENCOUNTER — Other Ambulatory Visit: Payer: Self-pay | Admitting: Family

## 2022-06-28 DIAGNOSIS — E782 Mixed hyperlipidemia: Secondary | ICD-10-CM

## 2022-06-28 DIAGNOSIS — Z79899 Other long term (current) drug therapy: Secondary | ICD-10-CM

## 2022-06-28 MED ORDER — ATORVASTATIN CALCIUM 10 MG PO TABS: 10.0000 mg | ORAL_TABLET | Freq: Every day | ORAL | 3 refills | Status: DC

## 2022-06-28 NOTE — Progress Notes (Signed)
Cholesterol medication sent to [facility].  Sent in lipitor, take at night time.  Also, medication is fine with other medications she is taking. Have her schedule lab only appt in 3 months and come fasting to repeat cholesterol.

## 2022-07-02 NOTE — Telephone Encounter (Signed)
Patient called in returning call she received. Informed her of medication been sent and relayed message. Patient also scheduled 3 month lab only appt. Thank you!

## 2022-07-03 NOTE — Progress Notes (Signed)
Yes ok to send letter.

## 2022-07-20 ENCOUNTER — Other Ambulatory Visit: Payer: Self-pay | Admitting: Family

## 2022-07-20 DIAGNOSIS — F419 Anxiety disorder, unspecified: Secondary | ICD-10-CM

## 2022-07-20 DIAGNOSIS — F32A Depression, unspecified: Secondary | ICD-10-CM

## 2022-07-24 ENCOUNTER — Ambulatory Visit
Admission: RE | Admit: 2022-07-24 | Discharge: 2022-07-24 | Disposition: A | Discharge status: Home / Self Care | Payer: Medicare HMO | Source: Ambulatory Visit | Attending: Family | Admitting: Family

## 2022-07-24 DIAGNOSIS — Z1231 Encounter for screening mammogram for malignant neoplasm of breast: Secondary | ICD-10-CM | POA: Insufficient documentation

## 2022-08-26 DIAGNOSIS — L814 Other melanin hyperpigmentation: Secondary | ICD-10-CM | POA: Diagnosis not present

## 2022-08-26 DIAGNOSIS — Z08 Encounter for follow-up examination after completed treatment for malignant neoplasm: Secondary | ICD-10-CM | POA: Diagnosis not present

## 2022-08-26 DIAGNOSIS — B078 Other viral warts: Secondary | ICD-10-CM | POA: Diagnosis not present

## 2022-08-26 DIAGNOSIS — Z85828 Personal history of other malignant neoplasm of skin: Secondary | ICD-10-CM | POA: Diagnosis not present

## 2022-08-26 DIAGNOSIS — L538 Other specified erythematous conditions: Secondary | ICD-10-CM | POA: Diagnosis not present

## 2022-08-26 DIAGNOSIS — L821 Other seborrheic keratosis: Secondary | ICD-10-CM | POA: Diagnosis not present

## 2022-08-26 DIAGNOSIS — L0889 Other specified local infections of the skin and subcutaneous tissue: Secondary | ICD-10-CM | POA: Diagnosis not present

## 2022-08-26 DIAGNOSIS — Z789 Other specified health status: Secondary | ICD-10-CM | POA: Diagnosis not present

## 2022-08-26 DIAGNOSIS — D225 Melanocytic nevi of trunk: Secondary | ICD-10-CM | POA: Diagnosis not present

## 2022-08-29 DIAGNOSIS — Z135 Encounter for screening for eye and ear disorders: Secondary | ICD-10-CM | POA: Diagnosis not present

## 2022-08-29 DIAGNOSIS — Z961 Presence of intraocular lens: Secondary | ICD-10-CM | POA: Diagnosis not present

## 2022-08-29 DIAGNOSIS — D3131 Benign neoplasm of right choroid: Secondary | ICD-10-CM | POA: Diagnosis not present

## 2022-08-29 DIAGNOSIS — H5203 Hypermetropia, bilateral: Secondary | ICD-10-CM | POA: Diagnosis not present

## 2022-08-29 DIAGNOSIS — H52223 Regular astigmatism, bilateral: Secondary | ICD-10-CM | POA: Diagnosis not present

## 2022-08-29 DIAGNOSIS — H524 Presbyopia: Secondary | ICD-10-CM | POA: Diagnosis not present

## 2022-08-29 DIAGNOSIS — H04123 Dry eye syndrome of bilateral lacrimal glands: Secondary | ICD-10-CM | POA: Diagnosis not present

## 2022-09-06 ENCOUNTER — Other Ambulatory Visit (INDEPENDENT_AMBULATORY_CARE_PROVIDER_SITE_OTHER): Payer: Medicare HMO

## 2022-09-06 DIAGNOSIS — E782 Mixed hyperlipidemia: Secondary | ICD-10-CM | POA: Diagnosis not present

## 2022-09-06 DIAGNOSIS — Z79899 Other long term (current) drug therapy: Secondary | ICD-10-CM

## 2022-09-06 LAB — LIPID PANEL
Cholesterol: 149 mg/dL (ref 0–200)
HDL: 51.5 mg/dL (ref >39.00)
LDL Cholesterol: 83 mg/dL (ref 0–99)
NonHDL: 97.87
Total CHOL/HDL Ratio: 3
Triglycerides: 72 mg/dL (ref 0.0–149.0)
VLDL: 14.4 mg/dL (ref 0.0–40.0)

## 2022-09-06 LAB — HEPATIC FUNCTION PANEL
ALT: 16 U/L (ref 0–35)
AST: 24 U/L (ref 0–37)
Albumin: 4.1 g/dL (ref 3.5–5.2)
Alkaline Phosphatase: 56 U/L (ref 39–117)
Bilirubin, Direct: 0.2 mg/dL (ref 0.0–0.3)
Total Bilirubin: 0.9 mg/dL (ref 0.2–1.2)
Total Protein: 7.2 g/dL (ref 6.0–8.3)

## 2022-09-10 ENCOUNTER — Telehealth: Payer: Medicare HMO | Admitting: Family

## 2022-09-10 NOTE — Progress Notes (Signed)
Pt returned phone call. Left message for pt to return call x1.

## 2022-09-10 NOTE — Telephone Encounter (Signed)
Left message for pt to return call x1.

## 2022-09-10 NOTE — Telephone Encounter (Signed)
Patient returned call regarding labs from [facility] like a call back

## 2022-09-10 NOTE — Telephone Encounter (Signed)
Pt called back returning Lindsay's call regarding results. Told pt Dugal's response. Pt had no questions/concerns. Call back # [phone]

## 2022-09-10 NOTE — Telephone Encounter (Signed)
Patient called in to follow up on her lab results. Relayed message and she wanted to know if she needed to continue her medication. Thank you!

## 2022-09-10 NOTE — Telephone Encounter (Signed)
LMTCB

## 2022-10-02 ENCOUNTER — Other Ambulatory Visit: Payer: Medicare Other

## 2022-10-21 NOTE — Telephone Encounter (Signed)
Pt ready for scheduling for PROLIA on or after : 10/21/22  Out-of-pocket cost due at time of visit: $345  Primary: [place] MEDICARE Prolia co-insurance: 20% Admin fee co-insurance: 20%  Secondary: --- Prolia co-insurance:  Admin fee co-insurance:   Medical Benefit Details: Date Benefits were checked: 10/11/22 Deductible: $240 met of $240 required/ Coinsurance: 20%/ Admin Fee: 20%  Prior Auth: N/A PA# Expiration Date:   # of doses approved:  Pharmacy benefit: Copay $--- If patient wants fill through the pharmacy benefit please send prescription to:  --- , and include estimated need by date in rx notes. Pharmacy will ship medication directly to the office.  Patient NOT eligible for Prolia Copay Card. Copay Card can make patient's cost as little as $25. Link to apply: [email or web address]://[email or web address]  ** This summary of benefits is an estimation of the patient's out-of-pocket cost. Exact cost may very based on individual plan coverage.

## 2022-10-28 NOTE — Telephone Encounter (Signed)
Please verify patient cost if ran in pharmacy benefits.

## 2022-10-29 ENCOUNTER — Other Ambulatory Visit (HOSPITAL_COMMUNITY): Payer: Self-pay

## 2022-11-04 NOTE — Telephone Encounter (Signed)
Left message to return call to our office.  Needs to set up lab appointment and prolia  Pharmacy benefits copay is $95

## 2022-11-08 NOTE — Telephone Encounter (Signed)
Left message to return call to our office.  

## 2022-11-19 NOTE — Telephone Encounter (Signed)
Sounds good thanks

## 2022-11-19 NOTE — Telephone Encounter (Signed)
Called left 3 message for patient. Not able to send message in my chart. Will send letter to call office.

## 2022-11-25 ENCOUNTER — Telehealth: Payer: Self-pay | Admitting: Family

## 2022-11-25 NOTE — Telephone Encounter (Signed)
Patient called in wanting to schedule her Prolia shot. Thank you!

## 2022-11-26 NOTE — Telephone Encounter (Signed)
Patient called to follow up on this, advised someone would reach out to her soon.

## 2022-11-27 ENCOUNTER — Other Ambulatory Visit: Payer: Self-pay

## 2022-11-27 ENCOUNTER — Other Ambulatory Visit (HOSPITAL_COMMUNITY): Payer: Self-pay

## 2022-11-27 DIAGNOSIS — M810 Age-related osteoporosis without current pathological fracture: Secondary | ICD-10-CM

## 2022-11-27 MED ORDER — DENOSUMAB 60 MG/ML ~~LOC~~ SOSY
60.0000 mg | PREFILLED_SYRINGE | Freq: Once | SUBCUTANEOUS | 0 refills | Status: AC
  Filled 2022-11-27 – 2022-12-03 (×2): qty 1, 180d supply, fill #0

## 2022-11-27 NOTE — Telephone Encounter (Signed)
Patient returned call regarding prolia,she would like a return call back

## 2022-11-27 NOTE — Telephone Encounter (Signed)
Pt called back to f/u on getting her prolia inj? Call back # [phone]

## 2022-11-27 NOTE — Telephone Encounter (Signed)
Called patient reviewed all following information including appointment, Co pay due at time of visit and if pick up of injection is needed from outside pharmacy.     Out of pocket for patient:  Pharmacy $95  Lab appointment : 11/14  Nurse visit:  11/20  Lab order placed: Yes  Prolia has been  []   Ordered  []   Script sent to local pharmacy for patient to bring   []   Script sent to Specialty pharmacy   [x]   Script sent to [facility] to deliver

## 2022-11-28 ENCOUNTER — Other Ambulatory Visit (INDEPENDENT_AMBULATORY_CARE_PROVIDER_SITE_OTHER): Payer: Medicare HMO

## 2022-11-28 DIAGNOSIS — M810 Age-related osteoporosis without current pathological fracture: Secondary | ICD-10-CM

## 2022-11-28 LAB — BASIC METABOLIC PANEL
BUN: 13 mg/dL (ref 6–23)
CO2: 32 meq/L (ref 19–32)
Calcium: 9.4 mg/dL (ref 8.4–10.5)
Chloride: 100 meq/L (ref 96–112)
Creatinine, Ser: 0.73 mg/dL (ref 0.40–1.20)
GFR: 85.43 mL/min (ref >60.00)
Glucose, Bld: 77 mg/dL (ref 70–99)
Potassium: 4.4 meq/L (ref 3.5–5.1)
Sodium: 138 meq/L (ref 135–145)

## 2022-11-29 NOTE — Progress Notes (Signed)
Normal labs.

## 2022-12-02 ENCOUNTER — Other Ambulatory Visit: Payer: Self-pay | Admitting: Family

## 2022-12-02 DIAGNOSIS — F419 Anxiety disorder, unspecified: Secondary | ICD-10-CM

## 2022-12-02 DIAGNOSIS — F32A Depression, unspecified: Secondary | ICD-10-CM

## 2022-12-03 ENCOUNTER — Encounter (HOSPITAL_COMMUNITY): Payer: Self-pay

## 2022-12-03 ENCOUNTER — Other Ambulatory Visit (HOSPITAL_COMMUNITY): Payer: Self-pay

## 2022-12-03 ENCOUNTER — Other Ambulatory Visit: Payer: Self-pay

## 2022-12-03 NOTE — Progress Notes (Signed)
Specialty Pharmacy Initial Fill Coordination Note  Nicole Calhoun is a 67 y.o. female contacted today regarding refills of specialty medication(s) Denosumab   Patient requested Courier to Provider Office   Delivery date: 12/04/22   Verified address: [facility] [address] [place]   Medication will be filled on 11/19.   Patient is aware of $95 copayment.

## 2022-12-04 ENCOUNTER — Other Ambulatory Visit (HOSPITAL_COMMUNITY): Payer: Self-pay

## 2022-12-04 ENCOUNTER — Ambulatory Visit: Payer: Medicare HMO

## 2022-12-04 ENCOUNTER — Other Ambulatory Visit (HOSPITAL_BASED_OUTPATIENT_CLINIC_OR_DEPARTMENT_OTHER): Payer: Self-pay

## 2022-12-04 DIAGNOSIS — M810 Age-related osteoporosis without current pathological fracture: Secondary | ICD-10-CM | POA: Diagnosis not present

## 2022-12-04 MED ORDER — DENOSUMAB 60 MG/ML ~~LOC~~ SOSY
60.0000 mg | PREFILLED_SYRINGE | Freq: Once | SUBCUTANEOUS | Status: AC
  Administered 2022-12-04: 60 mg via SUBCUTANEOUS

## 2022-12-04 NOTE — Progress Notes (Signed)
Patient presented for 6-month Prolia injection SQ to left arm given by Alaric Gladwin, CMA. Patient tolerated injection well. 

## 2022-12-05 ENCOUNTER — Other Ambulatory Visit (HOSPITAL_COMMUNITY): Payer: Self-pay

## 2022-12-06 ENCOUNTER — Telehealth: Payer: Self-pay

## 2022-12-06 ENCOUNTER — Other Ambulatory Visit (HOSPITAL_COMMUNITY): Payer: Self-pay

## 2022-12-06 NOTE — Telephone Encounter (Addendum)
ERROR

## 2022-12-26 ENCOUNTER — Ambulatory Visit: Payer: Medicare HMO | Admitting: Internal Medicine

## 2022-12-26 ENCOUNTER — Encounter: Payer: Self-pay | Admitting: Internal Medicine

## 2022-12-26 VITALS — BP 120/62 | HR 81 | Temp 99.3°F | Ht 60.0 in | Wt 146.0 lb

## 2022-12-26 DIAGNOSIS — R6884 Jaw pain: Secondary | ICD-10-CM | POA: Diagnosis not present

## 2022-12-26 MED ORDER — AMOXICILLIN 500 MG PO TABS: 1000.0000 mg | ORAL_TABLET | Freq: Two times a day (BID) | ORAL | 0 refills | Status: AC

## 2022-12-26 NOTE — Progress Notes (Signed)
Subjective:    Patient ID: Nicole Calhoun, female    DOB: Oct 10, 1955, 67 y.o.   MRN: [id number]  HPI Here due to sinus symptoms  Pounding sinus pain ---right maxillary--for about a week Stuffed up and can't get anything out No fever No post nasal drip No sore throat or ear pain No cough  Not really prone to sinus trouble Stabbing sensation  Tried some sinus meds---?may have had brief help Saline rinse didn't really help  Current Outpatient Medications on File Prior to Visit  Medication Sig Dispense Refill   atorvastatin (LIPITOR) 10 MG tablet Take 1 tablet (10 mg total) by mouth daily. 90 tablet 3   buPROPion (WELLBUTRIN XL) 150 MG 24 hr tablet TAKE ONE TABLET BY MOUTH ONCE A DAY 90 tablet 3   Calcium-Magnesium-Vitamin D (CALCIUM MAGNESIUM PO) Take by mouth.     cyclobenzaprine (FLEXERIL) 5 MG tablet TAKE 1 TABLET (5 MG TOTAL) BY MOUTH 2 (TWO) TIMES DAILY. *PA REQUIRED FOR WORKERS COMP - WCB*     desonide (DESOWEN) 0.05 % cream Apply topically 2 (two) times daily. 30 g 0   estradiol (ESTRACE) 0.1 MG/GM vaginal cream Place 1 Applicatorful vaginally 2 (two) times a week. 42.5 g 12   meloxicam (MOBIC) 7.5 MG tablet TAKE 1 TABLET BY MOUTH ONCE DAILY 30 tablet 0   sertraline (ZOLOFT) 50 MG tablet TAKE ONE TABLET BY MOUTH ONCE DAILY 90 tablet 3   valACYclovir (VALTREX) 500 MG tablet TAKE 1 TABLET BY MOUTH 2 TIMES DAILY FOR 3 DAYS WITH SYMPTOMS 18 tablet 2   No current facility-administered medications on file prior to visit.    Allergies  Allergen Reactions   Codeine Nausea Only   Fosamax [Alendronate] Other (See Comments)    Increased heartburn     Past Medical History:  Diagnosis Date   Arthritis    "all over" (07/04/2104)   Breast pain, right 04/23/2021   Chronic lower back pain    Closed fracture of proximal end of right humerus 06/21/2014   Coronary artery disease    Mild   Depression    Dysrhythmia    Family history of breast cancer 01/18/2021   Family history of  colon cancer in father 07/31/2017   Family history of rheumatoid arthritis 04/23/2021   Fibromyalgia    GERD (gastroesophageal reflux disease)    Hard of hearing    Hepatitis C    "I took the treatment and was cleared" (07/05/2014)   History of hiatal hernia    Myocardial infarction (HCC)    "I was told I'd had a light heart attack one time; maybe 2001"   Pancreatic cyst    PONV (postoperative nausea and vomiting)    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   CATARACT EXTRACTION W/ INTRAOCULAR LENS  IMPLANT, BILATERAL Bilateral    CESAREAN SECTION  X 3   FOOT SURGERY Left 04/28/2012   EXCISION OF PLANTAR FIBROMA , PLANTAR ASPECT LEFT ARCH   GANGLION CYST EXCISION Bilateral    "2 on the right; 1 on the left"   ORIF HUMERUS FRACTURE Right 07/05/2014   Procedure: OPEN REDUCTION INTERNAL FIXATION (ORIF) PROXIMAL HUMERUS FRACTURE;  Surgeon: Jones Broom, MD;  Location: [facility] OR;  Service: Orthopedics;  Laterality: Right;  Open reduction  internal fixation right shoulder    ORIF PROXIMAL HUMERUS FRACTURE Right 07/05/2014   TRANSTHORACIC ECHOCARDIOGRAM  06/08/2009   EF >55%, normal LV systolic function    Family History  Problem Relation Age of Onset   Arthritis Mother    Heart disease Mother 36   Stroke Mother 42   Colon cancer Father    Stroke Father 24   Breast cancer Maternal Grandmother 84   Brain cancer Cousin     Social History   Socioeconomic History   Marital status: Married    Spouse name: Not on file   Number of children: 2   Years of education: Not on file   Highest education level: Not on file  Occupational History   Not on file  Tobacco Use   Smoking status: Former    Current packs/day: 0.50    Average packs/day: 0.5 packs/day for 8.0 years (4.0 ttl  pk-yrs)    Types: Cigarettes   Smokeless tobacco: Never   Tobacco comments:    "quit smoking cigarettes in the 1980's"  Vaping Use   Vaping status: Never Used  Substance and Sexual Activity   Alcohol use: Yes    Comment: "used to drink when I was younger"   Drug use: No   Sexual activity: Yes  Other Topics Concern   Not on file  Social History Narrative   ** Merged History Encounter **       Two boys  Married    Social Drivers of Corporate investment banker Strain: Not on file  Food Insecurity: Not on file  Transportation Needs: Not on file  Physical Activity: Not on file  Stress: Not on file  Social Connections: Not on file  Intimate Partner Violence: Not on file   Review of Systems Overdue for dentist---no painful teeth though     Objective:   Physical Exam Constitutional:      Appearance: Normal appearance.  HENT:     Head:     Comments: Right maxillary tenderness    Right Ear: Tympanic membrane and ear canal normal.     Left Ear: Tympanic membrane and ear canal normal.     Mouth/Throat:     Pharynx: No oropharyngeal exudate or posterior oropharyngeal erythema.     Comments: Teeth look fine Musculoskeletal:     Cervical back: Neck supple.  Lymphadenopathy:     Cervical: No cervical adenopathy.  Neurological:     Mental Status: She is alert.     Comments: Normal sensation in trigeminal regions            Assessment & Plan:

## 2022-12-26 NOTE — Assessment & Plan Note (Signed)
Sinus area but symptoms don't really suggest sinus infection I am concerned for facial pain syndrome like trigeminal neuralgia Will try amoxil 1000 bid x 7 days---to see if it helps Has dental appt set up May need to consider tegretol or other if the pain persists/worsens

## 2023-02-11 ENCOUNTER — Other Ambulatory Visit: Payer: Self-pay | Admitting: Family

## 2023-02-11 ENCOUNTER — Telehealth: Payer: Self-pay

## 2023-02-11 MED ORDER — VALACYCLOVIR HCL 500 MG PO TABS: ORAL_TABLET | ORAL | 2 refills | Status: AC

## 2023-02-11 NOTE — Telephone Encounter (Signed)
Copied from CRM [id number]. Topic: Referral - Request for Referral >> Feb 11, 2023 10:56 AM Leavy Cella D wrote: Did the patient discuss referral with their provider in the last year? Yes (If No - schedule appointment) (If Yes - send message)  Appointment offered? No  Type of order/referral and detailed reason for visit: ENT - Sinus  Preference of office, provider, location: n/a  If referral order, have you been seen by this specialty before? No (If Yes, this issue or another issue? When? Where?  Can we respond through MyChart? No

## 2023-02-11 NOTE — Telephone Encounter (Signed)
Copied from CRM [id number]. Topic: Clinical - Medication Refill >> Feb 11, 2023 11:00 AM Leavy Cella D wrote: Most Recent Primary Care Visit:  Provider: Tillman Abide I  Department: [facility]  Visit Type: SAME DAY  Date: 12/26/2022  Medication: valACYclovir (VALTREX) 500 MG tablet   Has the patient contacted their pharmacy? No (Agent: If no, request that the patient contact the pharmacy for the refill. If patient does not wish to contact the pharmacy document the reason why and proceed with request.) (Agent: If yes, when and what did the pharmacy advise?)  Is this the correct pharmacy for this prescription? Yes If no, delete pharmacy and type the correct one.  This is the patient's preferred pharmacy:   [facility] [address] [place]), [place] - [address] [place] ([place] [address] Phone: [phone] Fax: [phone]    [place] [facility] [address] [place] [address] Phone: [phone] Fax: [phone]   Has the prescription been filled recently? No  Is the patient out of the medication? Yes  Has the patient been seen for an appointment in the last year OR does the patient have an upcoming appointment? Yes  Can we respond through MyChart? No  Agent: Please be advised that Rx refills may take up to 3 business days. We ask that you follow-up with your pharmacy.

## 2023-02-11 NOTE — Telephone Encounter (Signed)
Spoke with Nicole Calhoun and she would like for Tabitha to put in a referral for her to see ENT. Nicole Calhoun saw Dr. Alphonsus Sias on 12/26/22 for sinus issues. He told her that if things did not clear up she would need to be referred to ENT.

## 2023-02-18 DIAGNOSIS — Z01818 Encounter for other preprocedural examination: Secondary | ICD-10-CM | POA: Diagnosis not present

## 2023-02-18 DIAGNOSIS — Z800 Family history of malignant neoplasm of digestive organs: Secondary | ICD-10-CM | POA: Diagnosis not present

## 2023-02-26 DIAGNOSIS — L821 Other seborrheic keratosis: Secondary | ICD-10-CM | POA: Diagnosis not present

## 2023-02-26 DIAGNOSIS — D485 Neoplasm of uncertain behavior of skin: Secondary | ICD-10-CM | POA: Diagnosis not present

## 2023-02-26 DIAGNOSIS — D225 Melanocytic nevi of trunk: Secondary | ICD-10-CM | POA: Diagnosis not present

## 2023-02-26 DIAGNOSIS — L570 Actinic keratosis: Secondary | ICD-10-CM | POA: Diagnosis not present

## 2023-02-26 DIAGNOSIS — C4441 Basal cell carcinoma of skin of scalp and neck: Secondary | ICD-10-CM | POA: Diagnosis not present

## 2023-02-26 DIAGNOSIS — L814 Other melanin hyperpigmentation: Secondary | ICD-10-CM | POA: Diagnosis not present

## 2023-02-26 DIAGNOSIS — Z85828 Personal history of other malignant neoplasm of skin: Secondary | ICD-10-CM | POA: Diagnosis not present

## 2023-02-26 DIAGNOSIS — Z08 Encounter for follow-up examination after completed treatment for malignant neoplasm: Secondary | ICD-10-CM | POA: Diagnosis not present

## 2023-02-28 ENCOUNTER — Ambulatory Visit: Payer: Medicare HMO | Admitting: Family

## 2023-03-10 DIAGNOSIS — J019 Acute sinusitis, unspecified: Secondary | ICD-10-CM | POA: Diagnosis not present

## 2023-03-10 DIAGNOSIS — R519 Headache, unspecified: Secondary | ICD-10-CM | POA: Diagnosis not present

## 2023-03-12 DIAGNOSIS — R519 Headache, unspecified: Secondary | ICD-10-CM | POA: Diagnosis not present

## 2023-03-12 DIAGNOSIS — J019 Acute sinusitis, unspecified: Secondary | ICD-10-CM | POA: Diagnosis not present

## 2023-03-12 DIAGNOSIS — J329 Chronic sinusitis, unspecified: Secondary | ICD-10-CM | POA: Diagnosis not present

## 2023-03-18 DIAGNOSIS — C4441 Basal cell carcinoma of skin of scalp and neck: Secondary | ICD-10-CM | POA: Diagnosis not present

## 2023-04-08 ENCOUNTER — Telehealth: Payer: Self-pay | Admitting: Family

## 2023-04-08 NOTE — Telephone Encounter (Signed)
 This referral was made by [facility] already. Referral is in the system. Pt should follow up with them about this referral.  LM for pt to return call.

## 2023-04-08 NOTE — Telephone Encounter (Signed)
 Copied from CRM [id number]. Topic: Referral - Request for Referral >> Apr 08, 2023 11:23 AM Drema Balzarine wrote: Did the patient discuss referral with their provider in the last year? Yes (If No - schedule appointment) (If Yes - send message)  Appointment offered? No  Type of order/referral and detailed reason for visit: Gastroenterology, her doctor retired   Dealer of office, provider, location: [place]  If referral order, have you been seen by this specialty before? Yes, doctor referred  (If Yes, this issue or another issue? When? Where?  Can we respond through MyChart? No

## 2023-04-09 ENCOUNTER — Telehealth: Payer: Self-pay | Admitting: Internal Medicine

## 2023-04-09 NOTE — Telephone Encounter (Signed)
 Spoke with pt, she is aware that a referral has already been made. I have given her the phone number to contact [facility] to make her appointment.

## 2023-04-09 NOTE — Telephone Encounter (Signed)
 Dr. Rhea Belton,  Patient was seen in 02/2023 at [facility] for a consultation for a colonoscopy.  She did not realize they only do there procedures in their [facility] office.  She is wanting to have her procedure done in [place] because its closer for her.  Records in EPIC.  Please review and advise scheduling? Thanks  Dr. Rhea Belton DOD 04/09/2023

## 2023-04-10 NOTE — Telephone Encounter (Signed)
 Okay for colonoscopy in the [facility] due to family history of colon cancer

## 2023-04-23 ENCOUNTER — Encounter: Payer: Self-pay | Admitting: Family

## 2023-04-23 ENCOUNTER — Other Ambulatory Visit: Payer: Self-pay | Admitting: Family

## 2023-04-23 DIAGNOSIS — Z1231 Encounter for screening mammogram for malignant neoplasm of breast: Secondary | ICD-10-CM

## 2023-05-20 ENCOUNTER — Other Ambulatory Visit

## 2023-05-21 ENCOUNTER — Other Ambulatory Visit: Payer: Self-pay

## 2023-05-23 ENCOUNTER — Other Ambulatory Visit (HOSPITAL_COMMUNITY): Payer: Self-pay

## 2023-05-26 ENCOUNTER — Other Ambulatory Visit: Payer: Self-pay

## 2023-06-11 ENCOUNTER — Telehealth: Payer: Self-pay | Admitting: Family

## 2023-06-11 DIAGNOSIS — E782 Mixed hyperlipidemia: Secondary | ICD-10-CM

## 2023-06-11 NOTE — Telephone Encounter (Unsigned)
 Copied from CRM [id number]. Topic: Clinical - Medication Refill >> Jun 11, 2023  1:12 PM Juleen Oakland F wrote: Medication: atorvastatin  (LIPITOR) 10 MG tablet   Has the patient contacted their pharmacy? Yes (Agent: If no, request that the patient contact the pharmacy for the refill. If patient does not wish to contact the pharmacy document the reason why and proceed with request.) (Agent: If yes, when and what did the pharmacy advise?)  This is the patient's preferred pharmacy:    [facility] - [place] ([place] - [address]  ([place])  [address] Phone: [phone] Fax: [phone]   Is this the correct pharmacy for this prescription? Yes If no, delete pharmacy and type the correct one.   Has the prescription been filled recently? Yes  Is the patient out of the medication? No  Has the patient been seen for an appointment in the last year OR does the patient have an upcoming appointment? Yes  Can we respond through MyChart? No  Agent: Please be advised that Rx refills may take up to 3 business days. We ask that you follow-up with your pharmacy.

## 2023-06-12 ENCOUNTER — Other Ambulatory Visit: Payer: Self-pay | Admitting: *Deleted

## 2023-06-12 DIAGNOSIS — E782 Mixed hyperlipidemia: Secondary | ICD-10-CM

## 2023-06-12 MED ORDER — ATORVASTATIN CALCIUM 10 MG PO TABS: 10.0000 mg | ORAL_TABLET | Freq: Every day | ORAL | 3 refills | Status: DC

## 2023-06-12 MED ORDER — ATORVASTATIN CALCIUM 10 MG PO TABS: 10.0000 mg | ORAL_TABLET | Freq: Every day | ORAL | 3 refills | Status: AC

## 2023-06-12 NOTE — Telephone Encounter (Signed)
 Per chart review, medication was refilled today and sent to [facility]. However, it appears that pt would prefer it be sent to [facility] on [address]. This RN updated pt's pharmacy to reflect that change. RN called the pt and LVM with a callback number.

## 2023-06-20 ENCOUNTER — Ambulatory Visit

## 2023-06-20 ENCOUNTER — Other Ambulatory Visit

## 2023-06-25 ENCOUNTER — Encounter: Admitting: Family

## 2023-07-16 ENCOUNTER — Ambulatory Visit

## 2023-07-25 ENCOUNTER — Ambulatory Visit
Admission: RE | Admit: 2023-07-25 | Discharge: 2023-07-25 | Disposition: A | Discharge status: Home / Self Care | Source: Ambulatory Visit | Attending: Family | Admitting: Family

## 2023-07-25 DIAGNOSIS — Z1231 Encounter for screening mammogram for malignant neoplasm of breast: Secondary | ICD-10-CM | POA: Diagnosis not present

## 2023-07-29 ENCOUNTER — Other Ambulatory Visit: Payer: Self-pay

## 2023-08-04 ENCOUNTER — Ambulatory Visit: Payer: Self-pay | Admitting: Family

## 2023-08-08 ENCOUNTER — Other Ambulatory Visit: Payer: Self-pay | Admitting: Family

## 2023-08-08 ENCOUNTER — Other Ambulatory Visit: Payer: Self-pay

## 2023-08-08 DIAGNOSIS — M810 Age-related osteoporosis without current pathological fracture: Secondary | ICD-10-CM

## 2023-08-11 ENCOUNTER — Telehealth: Payer: Self-pay

## 2023-08-11 ENCOUNTER — Other Ambulatory Visit: Payer: Self-pay

## 2023-08-11 MED ORDER — DENOSUMAB 60 MG/ML ~~LOC~~ SOSY: 60.0000 mg | PREFILLED_SYRINGE | SUBCUTANEOUS | Status: AC

## 2023-08-11 NOTE — Telephone Encounter (Signed)
 Prolia VOB initiated via [email or web address]  Next Prolia inj DUE: NOW

## 2023-08-12 ENCOUNTER — Other Ambulatory Visit (HOSPITAL_COMMUNITY): Payer: Self-pay

## 2023-08-12 NOTE — Telephone Encounter (Signed)
 Pt ready for scheduling for PROLIA  on or after : 08/12/23  Option# 1: Buy/Bill (Office supplied medication)  Out-of-pocket cost due at time of clinic visit: $377  Number of injection/visits approved: 2  Primary: [employer] Prolia  co-insurance: 20% Admin fee co-insurance: $45  Secondary: --- Prolia  co-insurance:  Admin fee co-insurance:   Medical Benefit Details: Date Benefits were checked: 08/11/23 Deductible: NO/ Coinsurance: 20%/ Admin Fee: $45  Prior Auth: APPROVED PA# [id number] Expiration Date: 08/12/23-01/14/24   # of doses approved: 2 ----------------------------------------------------------------------- Option# 2- Med Obtained from pharmacy:  Pharmacy benefit: Copay $952.65 (Paid to pharmacy) Admin Fee: $45 (Pay at clinic)  Prior Auth: N/A PA# Expiration Date:   # of doses approved:   If patient wants fill through the pharmacy benefit please send prescription to: [employer], and include estimated need by date in rx notes. Pharmacy will ship medication directly to the office.  Patient NOT eligible for Prolia  Copay Card. Copay Card can make patient's cost as little as $25. Link to apply: [email or web address]://[email or web address]  ** This summary of benefits is an estimation of the patient's out-of-pocket cost. Exact cost may very based on individual plan coverage.

## 2023-08-12 NOTE — Telephone Encounter (Signed)
 Nicole Calhoun

## 2023-08-12 NOTE — Telephone Encounter (Signed)
 PA for buy and bill submitted via CMM. Key: [id number]  PHARMACY BENEFIT: $952.65

## 2023-08-13 ENCOUNTER — Other Ambulatory Visit: Payer: Self-pay

## 2023-08-18 ENCOUNTER — Ambulatory Visit: Payer: Self-pay | Admitting: Family

## 2023-08-18 ENCOUNTER — Other Ambulatory Visit (INDEPENDENT_AMBULATORY_CARE_PROVIDER_SITE_OTHER): Payer: Self-pay | Admitting: Family

## 2023-08-18 ENCOUNTER — Other Ambulatory Visit (INDEPENDENT_AMBULATORY_CARE_PROVIDER_SITE_OTHER)

## 2023-08-18 DIAGNOSIS — E7800 Pure hypercholesterolemia, unspecified: Secondary | ICD-10-CM | POA: Diagnosis not present

## 2023-08-18 DIAGNOSIS — R739 Hyperglycemia, unspecified: Secondary | ICD-10-CM

## 2023-08-18 LAB — COMPREHENSIVE METABOLIC PANEL WITH GFR
ALT: 27 U/L (ref 0–35)
AST: 29 U/L (ref 0–37)
Albumin: 4.1 g/dL (ref 3.5–5.2)
Alkaline Phosphatase: 51 U/L (ref 39–117)
BUN: 12 mg/dL (ref 6–23)
CO2: 33 meq/L — ABNORMAL HIGH (ref 19–32)
Calcium: 9.1 mg/dL (ref 8.4–10.5)
Chloride: 103 meq/L (ref 96–112)
Creatinine, Ser: 0.64 mg/dL (ref 0.40–1.20)
GFR: 91.34 mL/min (ref >60.00)
Glucose, Bld: 88 mg/dL (ref 70–99)
Potassium: 4.3 meq/L (ref 3.5–5.1)
Sodium: 141 meq/L (ref 135–145)
Total Bilirubin: 0.8 mg/dL (ref 0.2–1.2)
Total Protein: 6.9 g/dL (ref 6.0–8.3)

## 2023-08-18 LAB — LIPID PANEL
Cholesterol: 157 mg/dL (ref 0–200)
HDL: 59.4 mg/dL (ref >39.00)
LDL Cholesterol: 84 mg/dL (ref 0–99)
NonHDL: 97.96
Total CHOL/HDL Ratio: 3
Triglycerides: 72 mg/dL (ref 0.0–149.0)
VLDL: 14.4 mg/dL (ref 0.0–40.0)

## 2023-08-18 LAB — HEMOGLOBIN A1C: Hgb A1c MFr Bld: 6 % (ref 4.6–6.5)

## 2023-08-20 ENCOUNTER — Other Ambulatory Visit: Payer: Self-pay | Admitting: Family

## 2023-08-20 DIAGNOSIS — F32A Depression, unspecified: Secondary | ICD-10-CM

## 2023-08-25 ENCOUNTER — Telehealth: Payer: Self-pay | Admitting: Family

## 2023-08-25 ENCOUNTER — Ambulatory Visit (INDEPENDENT_AMBULATORY_CARE_PROVIDER_SITE_OTHER): Admitting: Family

## 2023-08-25 ENCOUNTER — Encounter: Payer: Self-pay | Admitting: Family

## 2023-08-25 VITALS — BP 116/68 | HR 76 | Temp 98.3°F | Ht 60.0 in | Wt 155.0 lb

## 2023-08-25 DIAGNOSIS — R635 Abnormal weight gain: Secondary | ICD-10-CM

## 2023-08-25 DIAGNOSIS — G500 Trigeminal neuralgia: Secondary | ICD-10-CM | POA: Insufficient documentation

## 2023-08-25 DIAGNOSIS — Z0000 Encounter for general adult medical examination without abnormal findings: Secondary | ICD-10-CM

## 2023-08-25 DIAGNOSIS — R92343 Mammographic extreme density, bilateral breasts: Secondary | ICD-10-CM | POA: Insufficient documentation

## 2023-08-25 DIAGNOSIS — E559 Vitamin D deficiency, unspecified: Secondary | ICD-10-CM

## 2023-08-25 DIAGNOSIS — Z780 Asymptomatic menopausal state: Secondary | ICD-10-CM | POA: Diagnosis not present

## 2023-08-25 DIAGNOSIS — Z85828 Personal history of other malignant neoplasm of skin: Secondary | ICD-10-CM

## 2023-08-25 DIAGNOSIS — Z800 Family history of malignant neoplasm of digestive organs: Secondary | ICD-10-CM | POA: Insufficient documentation

## 2023-08-25 DIAGNOSIS — Z1211 Encounter for screening for malignant neoplasm of colon: Secondary | ICD-10-CM

## 2023-08-25 DIAGNOSIS — R7303 Prediabetes: Secondary | ICD-10-CM

## 2023-08-25 DIAGNOSIS — R011 Cardiac murmur, unspecified: Secondary | ICD-10-CM

## 2023-08-25 LAB — TSH: TSH: 2.63 u[IU]/mL (ref 0.35–5.50)

## 2023-08-25 LAB — T3, FREE: T3, Free: 2.7 pg/mL (ref 2.3–4.2)

## 2023-08-25 LAB — T4, FREE: Free T4: 0.79 ng/dL (ref 0.60–1.60)

## 2023-08-25 LAB — VITAMIN D 25 HYDROXY (VIT D DEFICIENCY, FRACTURES): VITD: 41.04 ng/mL (ref 30.00–100.00)

## 2023-08-25 MED ORDER — GABAPENTIN 100 MG PO CAPS: 100.0000 mg | ORAL_CAPSULE | Freq: Three times a day (TID) | ORAL | 3 refills | Status: AC

## 2023-08-25 MED ORDER — GABAPENTIN 100 MG PO CAPS: 100.0000 mg | ORAL_CAPSULE | Freq: Three times a day (TID) | ORAL | 3 refills | Status: DC

## 2023-08-25 NOTE — Telephone Encounter (Signed)
 Please call and let pt know that [facility] does not need an ultrasound. They said they will just continue with annual mammogram.

## 2023-08-25 NOTE — Patient Instructions (Addendum)
 I have sent an electronic order over to your preferred location for the following:   []   2D Mammogram  []   Bilateral breast ultrasound [x]   Bone Density   Please give this center a call to get scheduled at your convenience.  [x]   [facility]  [address] [place] [address]  [phone]  Make sure to wear two piece  clothing  No lotions powders or deodorants the day of the appointment Make sure to bring picture ID and insurance card.  Bring list of medications you are currently taking including any supplements.   ------------------------------------   VISIT SUMMARY: Today, we reviewed your diabetes management, knee pain, facial pain, and other health concerns. We discussed your recent A1c levels, weight changes, and current medications. We also addressed your knee pain, facial pain, and other ongoing health issues.  YOUR PLAN: -TYPE 2 DIABETES MELLITUS: Type 2 diabetes is a condition where your body does not use insulin properly, leading to high blood sugar levels. Your recent A1c is 6.0%, indicating prediabetes. We will increase your Ozempic dose to 2 mg to help with weight loss and blood sugar control. Please ensure you drink 60-80 ounces of water daily. We will also order a repeat A1c test.  -HYPERLIPIDEMIA: Hyperlipidemia is having high levels of fats in your blood, which can increase the risk of heart disease. Your cholesterol levels are stable with your current medication, lovastatin 20 mg. We will order a cholesterol panel to monitor your levels.  -CHRONIC KIDNEY DISEASE: Chronic kidney disease means your kidneys are not working as well as they should. Your urine microalbumin levels are good, indicating no significant stress on your kidneys. Please ensure you drink 60-80 ounces of water daily. We will monitor your kidney function with regular labs.  -OSTEOPOROSIS: Osteoporosis is a condition where bones become  weak and brittle. We will schedule a bone density scan and a bilateral breast ultrasound for your fibrocystic dense breasts.  -LEFT KNEE PAIN: Your left knee pain may be related to patellar femoral syndrome or Plica syndrome. We will order an x-ray to rule out any acute injury. A knee brace and Voltaren gel may help with pain relief. Using a pillow between your legs during sleep may also provide comfort. If the pain persists, we may consider physical therapy.  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 We will refer you to neurology for further evaluation. You will start on gabapentin  100 mg at night, and you can increase it to twice daily if tolerated, and up to three times daily as needed.  -HEART MURMUR: A heart murmur is an unusual sound heard between heartbeats, which can be hereditary. We will order an echocardiogram to assess your heart function and rule out any structural abnormalities.  -OBESITY: Obesity is having excess body fat. We will increase your Ozempic dose to 2 mg to help with weight loss. Please try to make dietary changes to increase your protein and fiber intake. We may also consider a thyroid  panel to rule out other factors contributing to weight gain.  INSTRUCTIONS: Please follow up for a repeat A1c test and cholesterol panel. Ensure you stay hydrated by drinking 60-80 ounces of water daily. We will schedule a bone density scan and a bilateral breast ultrasound. An x-ray of your left knee will be ordered, and you may need to start using a knee brace and Voltaren  gel for pain relief. We will refer you to neurology for your facial pain and start you on gaba pentin. An echocardiogram will be ordered to assess your heart murmur. Consider making dietary changes to help with weight loss, and we may check your thyroid  function.

## 2023-08-25 NOTE — Telephone Encounter (Signed)
 LM for pt to return call.

## 2023-08-25 NOTE — Progress Notes (Signed)
 Subjective:  Patient ID: Nicole Calhoun, female    DOB: 10-30-1955  Age: 68 y.o. MRN: [id number]  Patient Care Team: Corwin Antu, FNP as PCP - General (Family Medicine)   CC:  Chief Complaint  Patient presents with   Annual Exam    HPI Nicole Calhoun is a 68 y.o. female who presents today for an annual physical exam. She reports consuming a general diet. Exercise is limited by left knee pain. She generally feels well. She reports sleeping well. She does have additional problems to discuss today.   Vision:Within last year Dental:Receives regular dental care  Mammogram:  07/30/23 Last pap: > 81 y/o  Colonoscopy: 07/31/2017 every five years  Bone density scan: 05/15/21  Pt is with acute concerns.   Discussed the use of AI scribe software for clinical note transcription with the patient, who gave verbal consent to proceed.  History of Present Illness   She experiences sharp, stabbing pain on the right side of her face, described as nerve-like, persistent since at least 2009. A CT scan at that time showed no evidence of sinusitis. The pain sometimes worsens with eating, resembling trigeminal neuralgia.  She has a history of basal cell carcinoma and follows up regularly with her dermatologist. She also has a family history of colon cancer, with her father being affected. She is due for a colonoscopy, as her last one was in 2019, and her father had colon cancer.    Wt Readings from Last 3 Encounters:  08/25/23 155 lb (70.3 kg)  12/26/22 146 lb (66.2 kg)  06/24/22 142 lb 12.8 oz (64.8 kg)    Advanced Directives Patient does not have advanced directives   DEPRESSION SCREENING    08/25/2023   10:29 AM 06/24/2022    9:43 AM 04/23/2021   10:02 AM 08/15/2020    2:22 PM 09/27/2019    3:40 PM 06/13/2017    8:30 AM 03/23/2015    1:14 PM  PHQ 2/9 Scores  PHQ - 2 Score 0 2 0 0 3 0 0  PHQ- 9 Score 2 7  0 13       ROS: Negative unless specifically indicated above in HPI.     Current Outpatient Medications:    atorvastatin  (LIPITOR) 10 MG tablet, Take 1 tablet (10 mg total) by mouth daily., Disp: 90 tablet, Rfl: 3   buPROPion  (WELLBUTRIN  XL) 150 MG 24 hr tablet, TAKE ONE TABLET BY MOUTH ONCE A DAY, Disp: 30 tablet, Rfl: 0   Calcium -Magnesium-Vitamin D  (CALCIUM  MAGNESIUM PO), Take by mouth., Disp: , Rfl:    cyclobenzaprine (FLEXERIL) 5 MG tablet, TAKE 1 TABLET (5 MG TOTAL) BY MOUTH 2 (TWO) TIMES DAILY. *PA REQUIRED FOR WORKERS COMP - WCB*, Disp: , Rfl:    desonide  (DESOWEN ) 0.05 % cream, Apply topically 2 (two) times daily., Disp: 30 g, Rfl: 0   estradiol  (ESTRACE ) 0.1 MG/GM vaginal cream, Place 1 Applicatorful vaginally 2 (two) times a week., Disp: 42.5 g, Rfl: 12   gabapentin  (NEURONTIN ) 100 MG capsule, Take 1 capsule (100 mg total) by mouth 3 (three) times daily., Disp: 90 capsule, Rfl: 3   meloxicam  (MOBIC ) 7.5 MG tablet, TAKE 1 TABLET BY MOUTH ONCE DAILY, Disp: 30 tablet, Rfl: 0   sertraline  (ZOLOFT ) 50 MG tablet, TAKE ONE TABLET BY MOUTH ONCE DAILY, Disp: 90 tablet, Rfl: 3   valACYclovir  (VALTREX ) 500 MG tablet, TAKE 1 TABLET BY MOUTH 2 TIMES DAILY FOR 3 DAYS WITH SYMPTOMS, Disp: 18 tablet, Rfl: 2  Current  Facility-Administered Medications:    denosumab  (PROLIA ) injection 60 mg, 60 mg, Subcutaneous, Q6 months, Anastazia Creek, FNP    Objective:    BP 116/68 (BP Location: Left Arm, Patient Position: Sitting, Cuff Size: Normal)   Pulse 76   Temp 98.3 F (36.8 C) (Temporal)   Ht 5' (1.524 m)   Wt 155 lb (70.3 kg)   SpO2 97%   BMI 30.27 kg/m   BP Readings from Last 3 Encounters:  08/25/23 116/68  12/26/22 120/62  06/24/22 128/66      Physical Exam HEENT: Sinuses normal. CARDIOVASCULAR: Heart murmur auscultated. MUSCULOSKELETAL: Pain on the right side of the left knee. No pain in the back of the left knee. No tenderness on the patella of the left knee. Swelling at the base of the left kneecap. Physical Exam Vitals reviewed.  Constitutional:       General: She is not in acute distress.    Appearance: Normal appearance. She is normal weight. She is not ill-appearing.  HENT:     Head: Normocephalic.     Right Ear: Tympanic membrane normal.     Left Ear: Tympanic membrane normal.     Nose: Nose normal.     Right Turbinates: Not swollen.     Left Turbinates: Not swollen.     Right Sinus: No maxillary sinus tenderness or frontal sinus tenderness.     Left Sinus: No maxillary sinus tenderness or frontal sinus tenderness.     Mouth/Throat:     Mouth: Mucous membranes are moist.  Eyes:     Extraocular Movements: Extraocular movements intact.     Pupils: Pupils are equal, round, and reactive to light.  Cardiovascular:     Rate and Rhythm: Normal rate and regular rhythm.     Heart sounds: Murmur heard.  Pulmonary:     Effort: Pulmonary effort is normal.     Breath sounds: Normal breath sounds.  Abdominal:     General: Abdomen is flat. Bowel sounds are normal.     Palpations: Abdomen is soft.     Tenderness: There is no guarding or rebound.  Musculoskeletal:        General: Normal range of motion.     Right hand: Normal strength.     Left hand: Normal strength.     Cervical back: Normal range of motion.     Right lower leg: No edema.     Left lower leg: No edema.  Skin:    General: Skin is warm.     Capillary Refill: Capillary refill takes less than 2 seconds.  Neurological:     General: No focal deficit present.     Mental Status: She is alert and oriented to person, place, and time.     Cranial Nerves: Cranial nerves 2-12 are intact. No cranial nerve deficit or facial asymmetry.     Sensory: Sensation is intact.  Psychiatric:        Mood and Affect: Mood normal.        Behavior: Behavior normal.        Thought Content: Thought content normal.        Judgment: Judgment normal.      Results LABS A1c: 6.0 (08/18/2023) LDL: 87  RADIOLOGY CT of the sinuses: No evidence of sinusitis; nasal turbinates normal in  size; nasal airways patent; no septal deviation (2009)      Assessment & Plan:   Assessment and Plan Assessment & Plan  Osteoporosis Osteoporosis with recent bone density scan in  May 2025. - Schedule bone density scan with bilateral breast ultrasound for fibrocystic dense breasts.  Suspected trigeminal neuralgia Suspected trigeminal neuralgia with chronic sharp, stabbing facial pain, previously evaluated by ENT with negative sinus CT in 2009. Pain persists daily, affecting right side of face. - Refer to neurology for further evaluation. - Start gabapentin  100 mg at night, increase to twice daily if tolerated, and up to three times daily as needed.  Heart murmur Heart murmur detected on auscultation, possibly hereditary. Differential includes bicuspid valve or mitral valve prolapse. - Order echocardiogram to assess heart function and rule out structural abnormalities.  Bil breast densities Ordering u/s bil breasts per imaging center  Mammogram reviewed normal limits.   Encounter for general adult examination with acute findings Patient Counseling(The following topics were reviewed):  Preventative care handout given to pt  Health maintenance and immunizations reviewed. Please refer to Health maintenance section. Pt advised on safe sex, wearing seatbelts in car, and proper nutrition labwork ordered today for annual Dental health: Discussed importance of regular tooth brushing, flossing, and dental visits.  Recording duration: 29 minutes  Pt open to shingles vaccine however will have to obtain this from pharmacy as we can not cover it as pt is medicare.   Additional 15 min used for acute concerns and referrals to be placed.     Follow-up: Return in about 1 year (around 08/24/2024) for f/u CPE.   Ginger Patrick, FNP

## 2023-08-26 ENCOUNTER — Ambulatory Visit: Payer: Self-pay | Admitting: Family

## 2023-08-27 NOTE — Telephone Encounter (Signed)
 LM for pt to return call.

## 2023-08-28 NOTE — Telephone Encounter (Signed)
 LM for pt to return call.

## 2023-09-01 DIAGNOSIS — H52222 Regular astigmatism, left eye: Secondary | ICD-10-CM | POA: Diagnosis not present

## 2023-09-01 DIAGNOSIS — Z961 Presence of intraocular lens: Secondary | ICD-10-CM | POA: Diagnosis not present

## 2023-09-01 DIAGNOSIS — H524 Presbyopia: Secondary | ICD-10-CM | POA: Diagnosis not present

## 2023-09-01 DIAGNOSIS — H5203 Hypermetropia, bilateral: Secondary | ICD-10-CM | POA: Diagnosis not present

## 2023-09-01 DIAGNOSIS — D3131 Benign neoplasm of right choroid: Secondary | ICD-10-CM | POA: Diagnosis not present

## 2023-09-03 ENCOUNTER — Other Ambulatory Visit: Payer: Self-pay

## 2023-09-03 NOTE — Progress Notes (Signed)
 Disenrolling - Prolia  last filled 12/03/2022 and no future appointment scheduled currently. Additionally, refill request was denied on 7.28.25 as not appropriate. 7.28.25 BIV shows that patient copay is $952.65 through pharmacy benefit v $377 buy/bill. Can reactivate if new rx received.

## 2023-09-09 DIAGNOSIS — Z85828 Personal history of other malignant neoplasm of skin: Secondary | ICD-10-CM | POA: Diagnosis not present

## 2023-09-09 DIAGNOSIS — L2989 Other pruritus: Secondary | ICD-10-CM | POA: Diagnosis not present

## 2023-09-09 DIAGNOSIS — L821 Other seborrheic keratosis: Secondary | ICD-10-CM | POA: Diagnosis not present

## 2023-09-09 DIAGNOSIS — Z789 Other specified health status: Secondary | ICD-10-CM | POA: Diagnosis not present

## 2023-09-09 DIAGNOSIS — L820 Inflamed seborrheic keratosis: Secondary | ICD-10-CM | POA: Diagnosis not present

## 2023-09-09 DIAGNOSIS — D1801 Hemangioma of skin and subcutaneous tissue: Secondary | ICD-10-CM | POA: Diagnosis not present

## 2023-09-09 DIAGNOSIS — Z08 Encounter for follow-up examination after completed treatment for malignant neoplasm: Secondary | ICD-10-CM | POA: Diagnosis not present

## 2023-09-09 DIAGNOSIS — L814 Other melanin hyperpigmentation: Secondary | ICD-10-CM | POA: Diagnosis not present

## 2023-09-09 DIAGNOSIS — L538 Other specified erythematous conditions: Secondary | ICD-10-CM | POA: Diagnosis not present

## 2023-09-24 ENCOUNTER — Encounter: Payer: Self-pay | Admitting: Neurology

## 2023-09-30 DIAGNOSIS — L538 Other specified erythematous conditions: Secondary | ICD-10-CM | POA: Diagnosis not present

## 2023-09-30 DIAGNOSIS — Z789 Other specified health status: Secondary | ICD-10-CM | POA: Diagnosis not present

## 2023-09-30 DIAGNOSIS — L820 Inflamed seborrheic keratosis: Secondary | ICD-10-CM | POA: Diagnosis not present

## 2023-09-30 DIAGNOSIS — L2989 Other pruritus: Secondary | ICD-10-CM | POA: Diagnosis not present

## 2023-10-03 ENCOUNTER — Ambulatory Visit

## 2023-10-10 ENCOUNTER — Encounter: Payer: Self-pay | Admitting: Internal Medicine

## 2023-11-11 ENCOUNTER — Telehealth: Payer: Self-pay | Admitting: Family

## 2023-11-11 NOTE — Telephone Encounter (Signed)
 Copied from CRM [id number]. Topic: Referral - Prior Authorization Question >> Nov 11, 2023  3:05 PM Nicole Calhoun wrote: Reason for CRM: pt needs PA before appt can be scheduled with [facility] [phone]

## 2023-11-12 ENCOUNTER — Ambulatory Visit

## 2023-11-12 VITALS — Ht 62.0 in | Wt 159.2 lb

## 2023-11-12 DIAGNOSIS — Z800 Family history of malignant neoplasm of digestive organs: Secondary | ICD-10-CM

## 2023-11-12 MED ORDER — NA SULFATE-K SULFATE-MG SULF 17.5-3.13-1.6 GM/177ML PO SOLN: 1.0000 | Freq: Once | ORAL | 0 refills | Status: AC

## 2023-11-12 NOTE — Telephone Encounter (Signed)
 I am working on getting the patient scheduled.

## 2023-11-12 NOTE — Telephone Encounter (Signed)
 Patient is scheduled on 11/7 at 3pm at [facility] for her Echo   Nothing further needed.

## 2023-11-12 NOTE — Progress Notes (Signed)
 No egg or soy allergy known to patient  No issues known to pt with past sedation with any surgeries or procedures- Post op nausea and vomitting   Patient denies ever being told they had issues or difficulty with intubation  No FH of Malignant Hyperthermia Pt is not on diet pills Pt is not on  home 02  Pt is not on blood thinners  Pt denies issues with constipation  No A fib or A flutter Have any cardiac testing pending--NO Pt can ambulate- Independently  Pt denies use of chewing tobacco Discussed diabetic I weight loss medication holds Discussed NSAID holds Checked BMI Pt instructed to use Singlecare.com or GoodRx for a price reduction on prep  Patient's chart reviewed by Norleen Schillings CNRA prior to previsit and patient appropriate for the [facility].  Pre visit completed and red dot placed by patient's name on their procedure day (on provider's schedule).

## 2023-11-12 NOTE — Telephone Encounter (Addendum)
 This has already been approved as of 09/2023 Notes are in Epic in the referral order and also on the appt desk that No Auth Required      Please make sure to be checking in the referral orders and on the appt desk orders for Auth notes.

## 2023-11-14 NOTE — Telephone Encounter (Signed)
 Copied from CRM [id number]. Topic: Referral - Question >> Nov 14, 2023  3:59 PM Thersia BROCKS wrote: Reason for CRM: Patient called in regarding a referral that she had NP Tabitha sent for her, would like a callback on who it was so she can be scheduled to see them

## 2023-11-17 NOTE — Telephone Encounter (Signed)
 LM for pt to return call.   I need to what referral she is referring to.

## 2023-11-18 NOTE — Telephone Encounter (Signed)
 LM for pt to return call.

## 2023-11-19 ENCOUNTER — Other Ambulatory Visit: Payer: Self-pay | Admitting: Family

## 2023-11-19 DIAGNOSIS — F32A Depression, unspecified: Secondary | ICD-10-CM

## 2023-11-20 NOTE — Telephone Encounter (Signed)
 LM for pt to return call.

## 2023-11-21 ENCOUNTER — Ambulatory Visit
Admission: RE | Admit: 2023-11-21 | Discharge: 2023-11-21 | Disposition: A | Discharge status: Home / Self Care | Source: Ambulatory Visit | Attending: Family | Admitting: Family

## 2023-11-21 DIAGNOSIS — R011 Cardiac murmur, unspecified: Secondary | ICD-10-CM

## 2023-11-21 DIAGNOSIS — E785 Hyperlipidemia, unspecified: Secondary | ICD-10-CM | POA: Diagnosis not present

## 2023-11-21 DIAGNOSIS — I351 Nonrheumatic aortic (valve) insufficiency: Secondary | ICD-10-CM | POA: Diagnosis not present

## 2023-11-21 NOTE — Progress Notes (Signed)
  Echocardiogram 2D Echocardiogram has been performed.  LAMON MAXWELL 11/21/2023, 3:28 PM

## 2023-11-22 LAB — ECHOCARDIOGRAM COMPLETE
Area-P 1/2: 3.91 cm2
P 1/2 time: 452 ms
S' Lateral: 3.2 cm

## 2023-11-24 NOTE — Telephone Encounter (Unsigned)
 Copied from CRM [id number]. Topic: General - Other >> Nov 21, 2023  4:34 PM Joesph B wrote: Reason for CRM: patient called to speak with Morna.

## 2023-11-25 NOTE — Telephone Encounter (Unsigned)
 Copied from CRM #[id number]. Topic: Clinical - Lab/Test Results >> Nov 25, 2023  9:15 AM Jasmin G wrote: Reason for CRM: Pt requested a call back ASAP to discuss recent EKG results as she states that she needs them for a procedure next week.

## 2023-11-25 NOTE — Telephone Encounter (Signed)
 Spoke with pt. She would like the results from her echo that was done on 11/21/23.

## 2023-11-25 NOTE — Telephone Encounter (Signed)
 I sent mychart results ealier today.

## 2023-11-28 ENCOUNTER — Ambulatory Visit: Admitting: Family

## 2023-11-28 VITALS — BP 134/72 | HR 91 | Temp 98.4°F | Wt 158.4 lb

## 2023-11-28 DIAGNOSIS — I5189 Other ill-defined heart diseases: Secondary | ICD-10-CM

## 2023-11-28 DIAGNOSIS — R072 Precordial pain: Secondary | ICD-10-CM

## 2023-11-28 DIAGNOSIS — I4510 Unspecified right bundle-branch block: Secondary | ICD-10-CM

## 2023-11-28 DIAGNOSIS — R002 Palpitations: Secondary | ICD-10-CM

## 2023-11-28 DIAGNOSIS — K219 Gastro-esophageal reflux disease without esophagitis: Secondary | ICD-10-CM | POA: Diagnosis not present

## 2023-11-28 DIAGNOSIS — R0609 Other forms of dyspnea: Secondary | ICD-10-CM

## 2023-11-28 LAB — CBC
HCT: 40 % (ref 36.0–46.0)
Hemoglobin: 13.7 g/dL (ref 12.0–15.0)
MCHC: 34.2 g/dL (ref 30.0–36.0)
MCV: 90.4 fl (ref 78.0–100.0)
Platelets: 237 K/uL (ref 150.0–400.0)
RBC: 4.42 Mil/uL (ref 3.87–5.11)
RDW: 13.1 % (ref 11.5–15.5)
WBC: 5.1 K/uL (ref 4.0–10.5)

## 2023-11-28 LAB — BASIC METABOLIC PANEL WITH GFR
BUN: 13 mg/dL (ref 6–23)
CO2: 32 meq/L (ref 19–32)
Calcium: 8.9 mg/dL (ref 8.4–10.5)
Chloride: 99 meq/L (ref 96–112)
Creatinine, Ser: 0.77 mg/dL (ref 0.40–1.20)
GFR: 79.57 mL/min (ref >60.00)
Glucose, Bld: 88 mg/dL (ref 70–99)
Potassium: 4.1 meq/L (ref 3.5–5.1)
Sodium: 138 meq/L (ref 135–145)

## 2023-11-28 LAB — IBC + FERRITIN
Ferritin: 19 ng/mL (ref 10.0–291.0)
Iron: 85 ug/dL (ref 42–145)
Saturation Ratios: 28.1 % (ref 20.0–50.0)
TIBC: 302.4 ug/dL (ref 250.0–450.0)
Transferrin: 216 mg/dL (ref 212.0–360.0)

## 2023-11-28 MED ORDER — OMEPRAZOLE 20 MG PO CPDR: 20.0000 mg | DELAYED_RELEASE_CAPSULE | Freq: Every day | ORAL | 0 refills | Status: DC

## 2023-11-28 MED ORDER — OMEPRAZOLE 20 MG PO CPDR: 20.0000 mg | DELAYED_RELEASE_CAPSULE | Freq: Every day | ORAL | 0 refills | Status: AC

## 2023-11-28 NOTE — Progress Notes (Signed)
 Established Patient Office Visit  Subjective:      CC:  Chief Complaint  Patient presents with   Follow-up    Review echo results    HPI: Nicole Calhoun is a 68 y.o. female presenting on 11/28/2023 for Follow-up (Review echo results) .  Discussed the use of AI scribe software for clinical note transcription with the patient, who gave verbal consent to proceed.  History of Present Illness Nicole Calhoun is a 68 year old female who presents with shortness of breath and chest pain.  She experiences shortness of breath, particularly during exertion such as walking or climbing stairs.  She describes chest pain that occurred while driving approximately two to three weeks ago, located in the central chest area. She also reports heartburn and acid reflux, which she has recently started noticing and acknowledges could be contributing to her chest pain.  She recently had an echocardiogram performed six days ago. She has not had an EKG since 2016. She mentions a family history of heart issues, as her sister is undergoing surgery for a more severe heart condition.  She is currently making dietary changes, including adopting a Mediterranean diet, to improve her health.         Social history:  Relevant past medical, surgical, family and social history reviewed and updated as indicated. Interim medical history since our last visit reviewed.  Allergies and medications reviewed and updated.  DATA REVIEWED: CHART IN EPIC     ROS: Negative unless specifically indicated above in HPI.    Current Outpatient Medications:    atorvastatin  (LIPITOR) 10 MG tablet, Take 1 tablet (10 mg total) by mouth daily., Disp: 90 tablet, Rfl: 3   buPROPion  (WELLBUTRIN  XL) 150 MG 24 hr tablet, TAKE ONE TABLET BY MOUTH ONCE A DAY, Disp: 30 tablet, Rfl: 0   Calcium -Magnesium-Vitamin D  (CALCIUM  MAGNESIUM PO), Take by mouth. (Patient not taking: Reported on 11/12/2023), Disp: , Rfl:     cetirizine (ZYRTEC ALLERGY) 10 MG tablet, Take 10 mg by mouth. As needed, Disp: , Rfl:    cyclobenzaprine (FLEXERIL) 5 MG tablet, TAKE 1 TABLET (5 MG TOTAL) BY MOUTH 2 (TWO) TIMES DAILY. *PA REQUIRED FOR WORKERS COMP - WCB*, Disp: , Rfl:    desonide  (DESOWEN ) 0.05 % cream, Apply topically 2 (two) times daily., Disp: 30 g, Rfl: 0   estradiol  (ESTRACE ) 0.1 MG/GM vaginal cream, Place 1 Applicatorful vaginally 2 (two) times a week., Disp: 42.5 g, Rfl: 12   gabapentin  (NEURONTIN ) 100 MG capsule, Take 1 capsule (100 mg total) by mouth 3 (three) times daily., Disp: 90 capsule, Rfl: 3   meloxicam  (MOBIC ) 7.5 MG tablet, TAKE 1 TABLET BY MOUTH ONCE DAILY, Disp: 30 tablet, Rfl: 0   omeprazole (PRILOSEC) 20 MG capsule, Take 1 capsule (20 mg total) by mouth daily., Disp: 30 capsule, Rfl: 0   sertraline  (ZOLOFT ) 50 MG tablet, TAKE ONE TABLET BY MOUTH ONCE DAILY, Disp: 90 tablet, Rfl: 3   valACYclovir  (VALTREX ) 500 MG tablet, TAKE 1 TABLET BY MOUTH 2 TIMES DAILY FOR 3 DAYS WITH SYMPTOMS, Disp: 18 tablet, Rfl: 2  Current Facility-Administered Medications:    denosumab  (PROLIA ) injection 60 mg, 60 mg, Subcutaneous, Q6 months, Kiante Ciavarella, FNP        Objective:        BP 134/72 (BP Location: Right Arm, Patient Position: Sitting, Cuff Size: Normal)   Pulse 91   Temp 98.4 F (36.9 C) (Temporal)   Wt 158 lb 6.4 oz (71.8  kg)   SpO2 96%   BMI 28.97 kg/m   Physical Exam VITALS: P- 77  Wt Readings from Last 3 Encounters:  11/28/23 158 lb 6.4 oz (71.8 kg)  11/12/23 159 lb 3.2 oz (72.2 kg)  08/25/23 155 lb (70.3 kg)    Physical Exam Vitals reviewed.  Constitutional:      General: She is not in acute distress.    Appearance: Normal appearance. She is normal weight. She is not ill-appearing, toxic-appearing or diaphoretic.  HENT:     Head: Normocephalic.  Cardiovascular:     Rate and Rhythm: Normal rate.     Heart sounds: Murmur heard.  Pulmonary:     Effort: Pulmonary effort is normal.   Musculoskeletal:        General: Normal range of motion.     Right lower leg: No edema.     Left lower leg: No edema.  Neurological:     General: No focal deficit present.     Mental Status: She is alert and oriented to person, place, and time. Mental status is at baseline.  Psychiatric:        Mood and Affect: Mood normal.        Behavior: Behavior normal.        Thought Content: Thought content normal.        Judgment: Judgment normal.          Results DIAGNOSTIC Echocardiogram: Grade 1 diastolic dysfunction, preserved ejection fraction, trivial mitral valve regurgitation (11/22/2023) EKG: Normal sinus rhythm, heart rate 77 bpm, QT interval <440 ms, nonspecific T wave inversions in V1 and V2, no ST elevations, right bundle branch block (11/28/2023)  Assessment & Plan:   Assessment and Plan Assessment & Plan Grade 1 diastolic dysfunction with preserved ejection fraction and trivial mitral valve regurgitation Echocardiogram shows grade 1 diastolic dysfunction, normal for aging, with preserved ejection fraction. Trivial mitral valve regurgitation is present. No significant symptoms of heart failure such as swelling or severe dyspnea. - Ensure cholesterol is at goal and maintain healthy eating choices, specifically a Mediterranean diet. - Monitor for symptoms of heart failure such as increased dyspnea or swelling.  Unspecified right bundle branch block EKG shows a right bundle branch block, common and often asymptomatic, QRS > 120. No significant arrhythmias or symptoms present. Explained that the right side of the heart's electrical signal is slightly delayed, but this is not causing symptoms.  Precordial chest pain and palpitations Intermittent chest pain and palpitations reported, possibly related to acid reflux. No significant arrhythmias on EKG. Right bundle branch block noted but not causing symptoms. - Prescribed Prilosec for two weeks to manage acid reflux. - Monitor for  any changes in chest pain or palpitations.  Dyspnea on exertion Reports dyspnea on exertion, particularly during activities like cleaning. No significant findings on echocardiogram or EKG to suggest acute cardiac issues. Possible contribution from acid reflux. - Continue to monitor symptoms and consider further evaluation if dyspnea worsens.  Gastroesophageal reflux disease Experiencing acid reflux, which may contribute to chest pain. Symptoms include heartburn and central chest pain. - Prescribed Prilosec for two weeks to reduce acid reflux symptoms. - Advised dietary modifications, including a Mediterranean diet.  General Health Maintenance Discussed the importance of maintaining a healthy lifestyle, including diet and exercise, to manage cardiovascular health. - Encouraged adherence to a Mediterranean diet. - Advised regular monitoring of cholesterol and blood pressure.        Return if symptoms worsen or fail to improve.  Ginger Patrick, MSN, APRN, FNP-C [facility]

## 2023-12-01 ENCOUNTER — Ambulatory Visit: Payer: Self-pay | Admitting: Family

## 2023-12-03 ENCOUNTER — Ambulatory Visit: Admitting: Neurology

## 2023-12-03 ENCOUNTER — Encounter: Admitting: Internal Medicine

## 2023-12-15 ENCOUNTER — Ambulatory Visit

## 2023-12-15 ENCOUNTER — Other Ambulatory Visit: Payer: Self-pay | Admitting: Family

## 2023-12-15 DIAGNOSIS — F32A Depression, unspecified: Secondary | ICD-10-CM

## 2023-12-19 MED ORDER — BUPROPION HCL ER (XL) 150 MG PO TB24: 150.0000 mg | ORAL_TABLET | Freq: Every day | ORAL | 1 refills | Status: AC

## 2023-12-19 NOTE — Telephone Encounter (Signed)
 Rx request was sent over to us  by [facility] for #30. I have resent the prescription for #90. I have left a detailed message with the pt letting her know this (okay per DPR).

## 2023-12-19 NOTE — Telephone Encounter (Signed)
 Copied from CRM [id number]. Topic: Clinical - Medication Question >> Dec 19, 2023 10:58 AM Revonda D wrote: Reason for CRM: Pt stated that she is supposed to have a 90 day supply for the buPROPion  (WELLBUTRIN  XL) 150 MG 24 hr tablet but it was filled for a 30 day supply. Pt would like a callback with an update.

## 2023-12-26 ENCOUNTER — Telehealth: Payer: Self-pay | Admitting: Family

## 2023-12-26 NOTE — Telephone Encounter (Signed)
 Copied from CRM #[id number]. Topic: Clinical - Medical Advice >> Dec 26, 2023  1:54 PM Victoria A wrote: Reason for CRM: Patient called said that the referral for Cardiologist is out of network needs cardiologist in-network.

## 2023-12-26 NOTE — Telephone Encounter (Signed)
 LM for pt to return my call.  Pt will need to contact her insurance to find out who is in network. We do not have/know this information.  Please provide message from provider/office when call is returned from patient.

## 2023-12-30 NOTE — Telephone Encounter (Signed)
 LM for pt to return my call

## 2023-12-31 ENCOUNTER — Encounter: Payer: Self-pay | Admitting: Internal Medicine

## 2023-12-31 NOTE — Telephone Encounter (Signed)
 LM for pt to return my call

## 2024-01-02 NOTE — Telephone Encounter (Signed)
 LM for pt to return my call.   Please tell pt that she needs to contact her insurance company to find out who is in network with her insurance.

## 2024-01-02 NOTE — Telephone Encounter (Signed)
 Copied from CRM #[id number]. Topic: General - Call Back - No Documentation >> Jan 01, 2024  4:21 PM Shereese L wrote: Reason for CRM: patient is returning office call and patient was adv that the she's out of the office She stated a doctor by the name of Gongi with [facility] but she doesn't know the full spelling. She's requesting call back

## 2024-01-07 ENCOUNTER — Ambulatory Visit

## 2024-01-07 VITALS — Ht 60.0 in | Wt 158.0 lb

## 2024-01-07 DIAGNOSIS — Z0000 Encounter for general adult medical examination without abnormal findings: Secondary | ICD-10-CM | POA: Diagnosis not present

## 2024-01-07 NOTE — Progress Notes (Signed)
 "  Chief Complaint  Patient presents with   Medicare Wellness     Subjective:   Nicole Calhoun is a 68 y.o. female who presents for a Medicare Annual Wellness Visit.  Visit info / Clinical Intake: Medicare Wellness Visit Type:: Initial Annual Wellness Visit Persons participating in visit and providing information:: patient Medicare Wellness Visit Mode:: Telephone If telephone:: video declined Since this visit was completed virtually, some vitals may be partially provided or unavailable. Missing vitals are due to the limitations of the virtual format.: Unable to obtain vitals - no equipment If Telephone or Video please confirm:: I connected with patient using audio/video enable telemedicine. I verified patient identity with two identifiers, discussed telehealth limitations, and patient agreed to proceed. Patient Location:: home Provider Location:: clinic Interpreter Needed?: No Pre-visit prep was completed: yes AWV questionnaire completed by patient prior to visit?: no Patient's Overall Health Status Rating: good Typical amount of pain: some Does pain affect daily life?: no Are you currently prescribed opioids?: no  Dietary Habits and Nutritional Risks How many meals a day?: 2 Eats fruit and vegetables daily?: yes Most meals are obtained by: preparing own meals; eating out In the last 2 weeks, have you had any of the following?: none Diabetic:: no  Functional Status Activities of Daily Living (to include ambulation/medication): Independent Ambulation: Independent with device- listed below Home Assistive Devices/Equipment: [place] (use sometimes) Medication Administration: Independent Home Management (perform basic housework or laundry): Independent Manage your own finances?: yes Primary transportation is: driving Concerns about vision?: no *vision screening is required for WTM* Concerns about hearing?: (!) yes Uses hearing aids?: (!) yes Hear whispered voice?: (!) no  *in-person visit only*  Fall Screening Falls in the past year?: 1 Number of falls in past year: 1 Was there an injury with Fall?: 0 Fall Risk Category Calculator: 2 Patient Fall Risk Level: Moderate Fall Risk  Fall Risk Patient at Risk for Falls Due to: Impaired mobility Fall risk Follow up: Falls evaluation completed; Education provided; Falls prevention discussed  Home and Transportation Safety: All rugs have non-skid backing?: N/A, no rugs All stairs or steps have railings?: yes Grab bars in the bathtub or shower?: (!) no Have non-skid surface in bathtub or shower?: (!) no Good home lighting?: yes Regular seat belt use?: yes Hospital stays in the last year:: no  Cognitive Assessment Difficulty concentrating, remembering, or making decisions? : no Will 6CIT or Mini Cog be Completed: yes What year is it?: 0 points What month is it?: 0 points Give patient an address phrase to remember (5 components): [address] [place]  About what time is it?: 0 points Count backwards from 20 to 1: 0 points Say the months of the year in reverse: 4 points Repeat the address phrase from earlier: 0 points 6 CIT Score: 4 points  Advance Directives (For Healthcare) Does Patient Have a Medical Advance Directive?: Yes Type of Advance Directive: Healthcare Power of [place]; Living will Copy of Healthcare Power of Attorney in Chart?: No - copy requested Copy of Living Will in Chart?: No - copy requested  Reviewed/Updated  Reviewed/Updated: Reviewed All (Medical, Surgical, Family, Medications, Allergies, Care Teams, Patient Goals)    Allergies (verified) Codeine and Fosamax  [alendronate ]   Current Medications (verified) Outpatient Encounter Medications as of 01/07/2024  Medication Sig   atorvastatin  (LIPITOR) 10 MG tablet Take 1 tablet (10 mg total) by mouth daily.   buPROPion  (WELLBUTRIN  XL) 150 MG 24 hr tablet Take 1 tablet (150 mg total) by  mouth daily.    Calcium -Magnesium-Vitamin D  (CALCIUM  MAGNESIUM PO) Take by mouth.   cetirizine (ZYRTEC ALLERGY) 10 MG tablet Take 10 mg by mouth. As needed   cyclobenzaprine (FLEXERIL) 5 MG tablet TAKE 1 TABLET (5 MG TOTAL) BY MOUTH 2 (TWO) TIMES DAILY. *PA REQUIRED FOR WORKERS COMP - WCB*   desonide  (DESOWEN ) 0.05 % cream Apply topically 2 (two) times daily.   estradiol  (ESTRACE ) 0.1 MG/GM vaginal cream Place 1 Applicatorful vaginally 2 (two) times a week.   gabapentin  (NEURONTIN ) 100 MG capsule Take 1 capsule (100 mg total) by mouth 3 (three) times daily.   meloxicam  (MOBIC ) 7.5 MG tablet TAKE 1 TABLET BY MOUTH ONCE DAILY   omeprazole  (PRILOSEC) 20 MG capsule Take 1 capsule (20 mg total) by mouth daily.   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   Chronic lower back pain    Closed fracture of proximal end of right humerus 06/21/2014   Coronary artery disease    Mild   Depression    Dysrhythmia    Family history of breast cancer 01/18/2021   Family history of colon cancer in father 07/31/2017   Family history of rheumatoid arthritis 04/23/2021   Fibromyalgia    GERD (gastroesophageal reflux disease)    Hard of hearing    Hepatitis C    I took the treatment and was cleared (07/05/2014)   History of hiatal hernia    Myocardial infarction (HCC)    I was told I'd had a light heart attack one time; maybe 2001   Pancreatic cyst    PONV (postoperative nausea and vomiting)    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   CATARACT EXTRACTION W/ INTRAOCULAR LENS  IMPLANT, BILATERAL Bilateral    CESAREAN SECTION  X 3   FOOT SURGERY Left 04/28/2012   EXCISION OF PLANTAR FIBROMA , PLANTAR ASPECT LEFT ARCH   GANGLION CYST EXCISION Bilateral    2 on the right; 1 on the left   ORIF HUMERUS FRACTURE Right 07/05/2014   Procedure: OPEN REDUCTION INTERNAL FIXATION (ORIF) PROXIMAL HUMERUS FRACTURE;  Surgeon: Eva Herring, MD;  Location: [facility] OR;  Service: Orthopedics;  Laterality: Right;  Open reduction internal fixation right shoulder    ORIF PROXIMAL HUMERUS FRACTURE Right 07/05/2014   TRANSTHORACIC ECHOCARDIOGRAM  06/08/2009   EF >55%, normal LV systolic function   Family History  Problem Relation Age of Onset   Arthritis Mother    Heart disease Mother 40   Stroke Mother 82   Colon cancer Father    Stroke Father 52   Breast cancer Maternal Grandmother 60   Brain cancer Cousin    Esophageal cancer Neg Hx    Rectal cancer Neg Hx    Stomach cancer Neg Hx    Social History   Occupational History  Not on file  Tobacco Use   Smoking status: Former    Current packs/day: 0.50    Average packs/day: 0.5 packs/day for 8.0 years (4.0 ttl pk-yrs)    Types: Cigarettes   Smokeless tobacco: Never   Tobacco comments:    quit smoking cigarettes in the 1980's  Vaping Use   Vaping status: Never Used  Substance and Sexual Activity   Alcohol use: Not Currently    Comment: used to drink when I was younger   Drug use: No   Sexual activity: Yes   Tobacco Counseling Counseling given: Not Answered Tobacco comments: quit smoking cigarettes in the 1980's  SDOH Screenings   Food Insecurity: No Food Insecurity (01/07/2024)  Housing: Unknown (01/07/2024)  Transportation Needs: No Transportation Needs (01/07/2024)  Utilities: Not At Risk (01/07/2024)  Alcohol  Screen: Low Risk (01/07/2024)  Depression (PHQ2-9): Low Risk (01/07/2024)  Financial Resource Strain: Low Risk (01/07/2024)  Physical Activity: Insufficiently Active (01/07/2024)  Social Connections: Moderately Isolated (01/07/2024)  Stress: No Stress Concern Present (01/07/2024)  Tobacco Use: Medium Risk (01/07/2024)  Health Literacy: Adequate Health Literacy (01/07/2024)   See flowsheets for full screening details  Depression Screen PHQ 2 & 9 Depression Scale- Over the past 2 weeks, how often have you been bothered by any of the following problems? Little interest or pleasure in doing things: 0 Feeling down, depressed, or hopeless (PHQ Adolescent also includes...irritable): 0 PHQ-2 Total Score: 0 Trouble falling or staying asleep, or sleeping too much: 0 Feeling tired or having little energy: 0 Poor appetite or overeating (PHQ Adolescent also includes...weight loss): 0 Feeling bad about yourself - or that you are a failure or have let yourself or your family down: 2 Trouble concentrating on things, such as reading the newspaper or watching television (PHQ Adolescent also includes...like school work): 0 Moving or speaking so slowly that other people could have noticed. Or the opposite - being so fidgety or restless that you have been moving around a lot more than usual: 0 Thoughts that you would be better off dead, or of hurting yourself in some way: 0 PHQ-9 Total Score: 2 If you checked off any problems, how difficult have these problems made it for you to do your work, take care of things at home, or get along with other people?: Not difficult at all     Goals Addressed             This Visit's Progress    I would like to get better with knee pain and get through appts to check out my health               Objective:    Today's Vitals   01/07/24 0938  Weight: 158 lb (71.7 kg)  Height: 5' (1.524 m)   Body mass index is 30.86 kg/m.  Hearing/Vision screen Vision  Screening - Comments:: UTD w/visits to [facility] Immunizations and Health Maintenance Health Maintenance  Topic Date Due   Zoster Vaccines- Shingrix (1 of 2) Never done   Colonoscopy  08/01/2022   Bone Density Scan  05/16/2023   Medicare Annual Wellness (AWV)  06/24/2023   Influenza Vaccine  04/13/2024 (Originally 08/15/2023)   Mammogram  07/24/2025   DTaP/Tdap/Td (2 - Td or Tdap) 07/30/2028   Pneumococcal Vaccine: 50+ Years  Completed   Hepatitis C Screening  Completed   Meningococcal B Vaccine  Aged Out   Hepatitis B Vaccines 19-59 Average Risk  Discontinued   COVID-19 Vaccine  Discontinued  Assessment/Plan:  This is a routine wellness examination for Nicole Calhoun.  Patient Care Team: Corwin Antu, FNP as PCP - General (Family Medicine)  I have personally reviewed and noted the following in the patients chart:   Medical and social history Use of alcohol, tobacco or illicit drugs  Current medications and supplements including opioid prescriptions. Functional ability and status Nutritional status Physical activity Advanced directives List of other physicians Hospitalizations, surgeries, and ER visits in previous 12 months Vitals Screenings to include cognitive, depression, and falls Referrals and appointments  No orders of the defined types were placed in this encounter.  In addition, I have reviewed and discussed with patient certain preventive protocols, quality metrics, and best practice recommendations. A written personalized care plan for preventive services as well as general preventive health recommendations were provided to patient.   Nicole LITTIE Saris, LPN   [id number]/7974   No follow-ups on file.  After Visit Summary: (MyChart) Due to this being a telephonic visit, the after visit summary with patients personalized plan was offered to patient via MyChart   Nurse Notes: No voiced or noted concerns at this time Appointment(s) made: (Aug 2026  w/PCP) HM Addressed: Pt has GE consult/appt (colonoscopy)  "

## 2024-01-07 NOTE — Patient Instructions (Addendum)
 Ms. Mccoin,  Thank you for taking the time for your Medicare Wellness Visit. I appreciate your continued commitment to your health goals. Please review the care plan we discussed, and feel free to reach out if I can assist you further.  Please note that Annual Wellness Visits do not include a physical exam. Some assessments may be limited, especially if the visit was conducted virtually. If needed, we may recommend an in-person follow-up with your provider.  Ongoing Care Seeing your primary care provider every 3 to 6 months helps us  monitor your health and provide consistent, personalized care.   Referrals If a referral was made during today's visit and you haven't received any updates within two weeks, please contact the referred provider directly to check on the status.  Here the site for resources in your county when needed: Findhelp.com  Recommended Screenings:  Health Maintenance  Topic Date Due   Zoster (Shingles) Vaccine (1 of 2) Never done   Colon Cancer Screening  08/01/2022   Osteoporosis screening with Bone Density Scan  05/16/2023   Medicare Annual Wellness Visit  06/24/2023   Flu Shot  04/13/2024*   Breast Cancer Screening  07/24/2025   DTaP/Tdap/Td vaccine (2 - Td or Tdap) 07/30/2028   Pneumococcal Vaccine for age over 43  Completed   Hepatitis C Screening  Completed   Meningitis B Vaccine  Aged Out   Hepatitis B Vaccine  Discontinued   COVID-19 Vaccine  Discontinued  *Topic was postponed. The date shown is not the original due date.       01/07/2024    9:49 AM  Advanced Directives  Does Patient Have a Medical Advance Directive? Yes  Type of Estate Agent of [place];Living will  Copy of Healthcare Power of Attorney in Chart? No - copy requested    Vision: Annual vision screenings are recommended for early detection of glaucoma, cataracts, and diabetic retinopathy. These exams can also reveal signs of chronic conditions such as diabetes  and high blood pressure.  Dental: Annual dental screenings help detect early signs of oral cancer, gum disease, and other conditions linked to overall health, including heart disease and diabetes.

## 2024-01-12 ENCOUNTER — Ambulatory Visit: Admitting: Cardiovascular Disease

## 2024-01-19 ENCOUNTER — Encounter: Payer: Self-pay | Admitting: Neurology

## 2024-01-19 ENCOUNTER — Ambulatory Visit: Admitting: Neurology

## 2024-01-19 VITALS — BP 123/61 | HR 87 | Ht 60.0 in | Wt 153.0 lb

## 2024-01-19 DIAGNOSIS — M542 Cervicalgia: Secondary | ICD-10-CM | POA: Diagnosis not present

## 2024-01-19 DIAGNOSIS — M5481 Occipital neuralgia: Secondary | ICD-10-CM

## 2024-01-19 NOTE — Progress Notes (Signed)
 [facility] Clinic Note - Initial Visit   Date: 01/19/2024   Nicole Calhoun MRN: [id number] DOB: 11-03-1955   Dear Ginger Patrick, FNP:  Thank you for your kind referral of Nicole Calhoun for consultation of tingling. Although her history is well known to you, please allow us  to reiterate it for the purpose of our medical record. The patient was accompanied to the clinic by self.    Nicole Calhoun is a 69 y.o. left-handed female with GERD, depression, hyperlipidemia, and chronic neck pain presenting for evaluation of tingling.   IMPRESSION/PLAN: Cervicalgia and left occipital neuralgia with paresthesias involving the base of the scalp on the left - Start neck PT - Advised to use flexeril as needed for neck pain  Return to clinic, if no improvement with PT  ------------------------------------------------------------- History of present illness: Discussed the use of AI scribe software for clinical note transcription with the patient, who gave verbal consent to proceed.  History of Present Illness Nicole Calhoun is a 69 year old female who presents with tingling sensation in the back of her head. She has been experiencing a tingling sensation in the back of her head, primarily on the left side, for at least three months. The sensation is not constant but occurs frequently, almost daily, and lasts for a couple of hours each time. The tingling sometimes becomes sore. The sensation was present last night and continues to occur.  Previously, she experienced severe right facial pain described as 'felt like somebody was stabbing me right in the face,'. This pain has since resolved. She was started on gabapentin  100 mg at bedtime for this issue but is no longer taking it as she avoids medication unless absolutely necessary.  She has neck pain, which she attributes to arthritis, and experiences difficulty turning her neck. She experiences difficulty  turning her neck. No current facial pain.  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   Chronic lower back pain    Closed fracture of proximal end of right humerus 06/21/2014   Coronary artery disease    Mild   Depression    Dysrhythmia    Family history of breast cancer 01/18/2021   Family history of colon cancer in father 07/31/2017   Family history of rheumatoid arthritis 04/23/2021   Fibromyalgia    GERD (gastroesophageal reflux disease)    Hard of hearing    Hepatitis C    I took the treatment and was cleared (07/05/2014)   History of hiatal hernia    Myocardial infarction (HCC)    I was told I'd had a light heart attack one time; maybe 2001   Pancreatic cyst    PONV (postoperative nausea and vomiting)    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   CATARACT EXTRACTION W/ INTRAOCULAR LENS  IMPLANT, BILATERAL Bilateral    CESAREAN SECTION  X 3   FOOT SURGERY Left 04/28/2012   EXCISION OF PLANTAR FIBROMA , PLANTAR ASPECT LEFT ARCH   GANGLION CYST EXCISION Bilateral    2 on the right; 1 on the left   ORIF HUMERUS FRACTURE Right 07/05/2014   Procedure: OPEN REDUCTION INTERNAL FIXATION (ORIF) PROXIMAL HUMERUS FRACTURE;  Surgeon: Eva Herring, MD;  Location: [facility] OR;  Service: Orthopedics;  Laterality:  Right;  Open reduction internal fixation right shoulder    ORIF PROXIMAL HUMERUS FRACTURE Right 07/05/2014   TRANSTHORACIC ECHOCARDIOGRAM  06/08/2009   EF >55%, normal LV systolic function     Medications:  Outpatient Encounter Medications as of 01/19/2024  Medication Sig   atorvastatin  (LIPITOR) 10 MG tablet Take 1 tablet (10 mg total) by mouth daily.   buPROPion  (WELLBUTRIN  XL) 150 MG 24 hr tablet Take 1 tablet (150 mg total) by mouth daily.   Calcium -Magnesium-Vitamin D  (CALCIUM  MAGNESIUM PO) Take by mouth.   cetirizine (ZYRTEC ALLERGY) 10 MG tablet Take 10 mg by mouth. As needed   cyclobenzaprine (FLEXERIL) 5 MG tablet TAKE 1 TABLET (5 MG TOTAL) BY MOUTH 2 (TWO) TIMES DAILY. *PA REQUIRED FOR WORKERS COMP - WCB*   desonide  (DESOWEN ) 0.05 % cream Apply topically 2 (two) times daily.   estradiol  (ESTRACE ) 0.1 MG/GM vaginal cream Place 1 Applicatorful vaginally 2 (two) times a week.   gabapentin  (NEURONTIN ) 100 MG capsule Take 1 capsule (100 mg total) by mouth 3 (three) times daily.   meloxicam  (MOBIC ) 7.5 MG tablet TAKE 1 TABLET BY MOUTH ONCE DAILY   omeprazole  (PRILOSEC) 20 MG capsule Take 1 capsule (20 mg total) by mouth daily.   sertraline  (ZOLOFT ) 50 MG tablet TAKE ONE TABLET BY MOUTH ONCE DAILY   valACYclovir  (VALTREX ) 500 MG tablet TAKE 1 TABLET BY MOUTH 2 TIMES DAILY FOR 3 DAYS WITH SYMPTOMS   Facility-Administered Encounter Medications as of 01/19/2024  Medication   denosumab  (PROLIA ) injection 60 mg    Allergies: Allergies[1]  Family History: Family History  Problem Relation Age of Onset   Arthritis Mother    Heart disease Mother 44   Stroke Mother 42   Colon cancer Father    Stroke Father 58   Breast cancer Maternal Grandmother 66   Brain cancer Cousin    Esophageal cancer Neg Hx    Rectal cancer Neg Hx    Stomach cancer Neg Hx     Social History: Social History[2] Social History   Social History Narrative   ** Merged History Encounter ** Two boys    Married        Are you right handed or left handed? Left handed    Are you currently employed ? No    What is your current occupation? Retired    Do you live at home alone? No    Who lives with you? Husband    What type of home do you live in: 1 story or 2 story? One story home        Vital Signs:  BP 123/61   Pulse 87   Ht 5' (1.524 m)   Wt 153 lb (69.4 kg)   SpO2 98%   BMI 29.88 kg/m    Neurological Exam: MENTAL STATUS including orientation to time, place, person, recent and remote memory, attention span and concentration, language, and fund of knowledge is normal.  Speech is not dysarthric.  CRANIAL NERVES: II:  No visual field defects.     III-IV-VI: Pupils equal round and reactive to light.  Normal conjugate, extra-ocular eye movements in all directions of gaze.  No nystagmus.  No ptosis.   V:  Normal facial sensation.    VII:  Normal facial symmetry and movements.   VIII:  Normal hearing and vestibular function.   IX-X:  Normal palatal movement.   XI:  Normal shoulder shrug and head rotation.   XII:  Normal tongue strength and range of motion, no deviation or fasciculation.  MOTOR:  Reduced neck ROM with rotation, especially to the left.  No atrophy, fasciculations or abnormal movements.  No pronator drift.   Upper Extremity:  Right  Left  Deltoid  5/5   5/5   Biceps  5/5   5/5   Triceps  5/5   5/5   Wrist extensors  5/5   5/5   Wrist flexors  5/5   5/5   Finger extensors  5/5   5/5   Finger flexors  5/5   5/5   Dorsal interossei  5/5   5/5   Abductor pollicis  5/5   5/5   Tone (Ashworth scale)  0  0   Lower Extremity:  Right  Left  Hip flexors  5/5   5/5   Knee flexors  5/5   5/5   Knee extensors  5/5   5/5   Dorsiflexors  5/5   5/5   Plantarflexors  5/5   5/5   Toe extensors  5/5   5/5   Toe flexors  5/5   5/5   Tone (Ashworth scale)  0  0   MSRs:                                           Right        Left brachioradialis 2+  2+  biceps 2+  2+  triceps 2+   2+  patellar 2+  2+  ankle jerk 2+  2+  Hoffman no  no  plantar response down  down   SENSORY:  Normal and symmetric perception of light touch, pinprick, vibration, and temperature.  Romberg's sign absent.   COORDINATION/GAIT: Normal finger-to- nose-finger.  Intact rapid alternating movements bilaterally.  Able to rise from a chair without using arms.  Gait narrow based and stable. Tandem and stressed gait intact.     Thank you for allowing me to participate in patient's care.  If I can answer any additional questions, I would be pleased to do so.    Sincerely,    Zyree Traynham K. Bowen Goyal, DO     [1]  Allergies Allergen Reactions   Codeine Nausea Only   Fosamax  [Alendronate ] Other (See Comments)    Increased heartburn   [2]  Social History Tobacco Use   Smoking status: Former    Current packs/day: 0.50    Average packs/day: 0.5 packs/day for 8.0 years (4.0 ttl pk-yrs)    Types: Cigarettes   Smokeless tobacco: Never   Tobacco comments:    quit smoking cigarettes in the 1980's  Vaping Use   Vaping status: Never Used  Substance Use Topics   Alcohol use: Not Currently    Comment: used to drink when I was younger   Drug use: No   "

## 2024-01-20 ENCOUNTER — Encounter: Admitting: Internal Medicine

## 2024-01-26 ENCOUNTER — Ambulatory Visit: Attending: Cardiology | Admitting: Cardiology

## 2024-01-26 ENCOUNTER — Encounter: Payer: Self-pay | Admitting: Cardiology

## 2024-01-26 VITALS — BP 136/62 | HR 76 | Ht 60.0 in | Wt 154.0 lb

## 2024-01-26 DIAGNOSIS — R0609 Other forms of dyspnea: Secondary | ICD-10-CM | POA: Diagnosis not present

## 2024-01-26 DIAGNOSIS — R739 Hyperglycemia, unspecified: Secondary | ICD-10-CM | POA: Diagnosis not present

## 2024-01-26 DIAGNOSIS — I4510 Unspecified right bundle-branch block: Secondary | ICD-10-CM | POA: Diagnosis not present

## 2024-01-26 NOTE — Patient Instructions (Addendum)
 We recommend signing up for the patient portal called MyChart.  Patients are able to view lab/test results, encounter notes, upcoming appointments, etc.  Non-urgent messages can be sent to your provider as well, go to [email or web address].   Medication Instructions:  No changes *If you need a refill on your cardiac medications before your next appointment, please call your pharmacy*  Lab Work: None ordered If you have labs (blood work) drawn today and your tests are completely normal, you will receive your results only by: MyChart Message (if you have MyChart) OR A paper copy in the mail If you have any lab test that is abnormal or we need to change your treatment, we will call you to review the results.  Testing/Procedures:                 ETT- Exercise Treadmill Test  Medication instructions: You may take all of your regularly scheduled medications.  Do not eat, drink or use tobacco products four hours prior to the test.  Water is ok.  3.  Dress prepared to exercise in a comfortable, two piece clothing outfit and walking shoes.  4.  Bring any current prescription medications with you the day of the test.  5.  Notify the office 24 hours in advance if you cannot keep this appointment.  6.  If you have any questions, please call [phone].   Follow-Up: At [facility], you and your health needs are our priority.  As part of our continuing mission to provide you with exceptional heart care, our providers are all part of one team.  This team includes your primary Cardiologist (physician) and Advanced Practice Providers or APPs (Physician Assistants and Nurse Practitioners) who all work together to provide you with the care you need, when you need it.  Your next appointment:   Follow up as needed   Provider:   Dr Ladona  We recommend signing up for the patient portal called MyChart.  Sign up information is provided on this After Visit Summary.  MyChart is used to  connect with patients for Virtual Visits (Telemedicine).  Patients are able to view lab/test results, encounter notes, upcoming appointments, etc.  Non-urgent messages can be sent to your provider as well.   To learn more about what you can do with MyChart, go to [email or web address].

## 2024-01-26 NOTE — Progress Notes (Signed)
 " Cardiology Office Note:  .   Date:  01/26/2024  ID:  Nicole Calhoun, DOB 1955/06/14, MRN [id number] PCP: Corwin Antu, FNP  [facility] Providers Cardiologist:  None   History of Present Illness: .   Nicole Calhoun is a 69 y.o. referred to me for evaluation of abnormal EKG, found to have new onset right bundle branch block.  She has noticed mild dyspnea on exertion and decreased exercise tolerance for the past 6 months but otherwise continues to remain active.  No recent weight change.    Discussed the use of AI scribe software for clinical note transcription with the patient, who gave verbal consent to proceed.  History of Present Illness Nicole Calhoun is a 69 year old female who presents for cardiology evaluation of an abnormal EKG. She was referred by her primary care doctor for evaluation of an abnormal EKG.  She reports 6 months of fatigue and dyspnea with exertion, such as during housework and vacuuming, which resolves with rest. She denies orthopnea, paroxysmal nocturnal dyspnea, dizziness, or syncope with activity. She walks for exercise, uses her treadmill infrequently, does not smoke, and reports stable weight.  Cardiac Studies relevent.   ECHOCARDIOGRAM COMPLETE 11/21/2023  1. Left ventricular ejection fraction, by estimation, is 55 to 60%. The left ventricle has normal function. The left ventricle has no regional wall motion abnormalities. Left ventricular diastolic parameters are consistent with Grade I diastolic dysfunction (impaired relaxation). 2. Right ventricular systolic function is normal. The right ventricular size is normal.  EKG:     EKG 11/28/2023: Normal sinus rhythm at rate of 77 bpm, normal axis, right bundle branch block.  No evidence of ischemia.  Compared to 07/12/2014, right bundle branch block is new. Labs   Lab Results  Component Value Date   CHOL 157 08/18/2023   HDL 59.40 08/18/2023   LDLCALC 84 08/18/2023   TRIG 72.0  08/18/2023   CHOLHDL 3 08/18/2023   No results found for: LIPOA  Recent Labs    08/18/23 0740 11/28/23 0840  NA 141 138  K 4.3 4.1  CL 103 99  CO2 33* 32  GLUCOSE 88 88  BUN 12 13  CREATININE 0.64 0.77  CALCIUM  9.1 8.9    Lab Results  Component Value Date   ALT 27 08/18/2023   AST 29 08/18/2023   ALKPHOS 51 08/18/2023   BILITOT 0.8 08/18/2023      Latest Ref Rng & Units 11/28/2023    8:40 AM 04/23/2021   10:49 AM 09/22/2019    8:20 AM  CBC  WBC 4.0 - 10.5 K/uL 5.1  5.6  5.7   Hemoglobin 12.0 - 15.0 g/dL 86.2  86.3  86.6   Hematocrit 36.0 - 46.0 % 40.0  40.9  39.1   Platelets 150.0 - 400.0 K/uL 237.0  246.0  219.0    Lab Results  Component Value Date   HGBA1C 6.0 08/18/2023    Lab Results  Component Value Date   TSH 2.63 08/25/2023    ROS  Review of Systems  Cardiovascular:  Negative for chest pain, dyspnea on exertion and leg swelling.   Physical Exam:   VS:  BP 136/62   Pulse 76   Ht 5' (1.524 m)   Wt 154 lb (69.9 kg)   SpO2 96%   BMI 30.08 kg/m    Wt Readings from Last 3 Encounters:  01/26/24 154 lb (69.9 kg)  01/19/24 153 lb (69.4 kg)  01/07/24 158 lb (  71.7 kg)    BP Readings from Last 3 Encounters:  01/26/24 136/62  01/19/24 123/61  11/28/23 134/72   Physical Exam Constitutional:      Appearance: She is obese.  Neck:     Vascular: No carotid bruit or JVD.  Cardiovascular:     Rate and Rhythm: Normal rate and regular rhythm.     Pulses: Intact distal pulses.     Heart sounds: Normal heart sounds. No murmur heard.    No gallop.  Pulmonary:     Effort: Pulmonary effort is normal.     Breath sounds: Normal breath sounds.  Abdominal:     General: Bowel sounds are normal.     Palpations: Abdomen is soft.  Musculoskeletal:     Right lower leg: No edema.     Left lower leg: No edema.     ASSESSMENT AND PLAN: .      ICD-10-CM   1. Dyspnea on exertion  R06.09 Cardiac Stress Test: Informed Consent Details: Physician/Practitioner  Attestation; Transcribe to consent form and obtain patient signature    EXERCISE TOLERANCE TEST (ETT)    2. RBBB  I45.10 Cardiac Stress Test: Informed Consent Details: Physician/Practitioner Attestation; Transcribe to consent form and obtain patient signature    EXERCISE TOLERANCE TEST (ETT)    3. Hyperglycemia  R73.9      Assessment & Plan Dyspnea on exertion Approximately six months, occurring with activities such as housework and vacuuming, and resolving with rest. No nocturnal dyspnea. Gradual onset without syncope. Likely related to mild diastolic dysfunction and age-related cardiac changes. - Ordered treadmill stress test to assess cardiac function during exertion.  Right bundle branch block EKG shows right bundle branch block, an EKG abnormality without indication of myocardial infarction or significant cardiac pathology. Heart is in good condition. - No restrictions on physical activity.  Mild diastolic dysfunction Echocardiogram indicates mild diastolic dysfunction, common with aging and contributing to dyspnea on exertion. Emphasized the importance of regular physical activity to improve cardiac relaxation and function. - Encouraged regular physical activity to improve cardiac function. - No clinical evidence of heart failure.  Hyperglycemia Mild hyperglycemia with slightly elevated blood sugar levels. Discussed the importance of weight loss to manage blood sugar levels and reduce cardiovascular risk. - Advised weight loss of 10-15 pounds to help manage blood sugar levels and reduce cardiovascular risk.  Follow up: I will personally perform the test and if I find abnormalities,  will perform further evaluation. Otherwise unless new on ongoing symptoms(patient advised to contact us ), preventive  therapy is recommended. I will then see the patient on a PRN basis.   Signed,  Gordy Bergamo, MD, [facility] 01/26/2024, 6:22 PM [facility] [address] [place], [place]  [address] Phone: [phone]. Fax:  [phone]  "

## 2024-02-04 ENCOUNTER — Ambulatory Visit: Admitting: *Deleted

## 2024-02-04 VITALS — Ht 60.0 in | Wt 153.0 lb

## 2024-02-04 DIAGNOSIS — Z800 Family history of malignant neoplasm of digestive organs: Secondary | ICD-10-CM

## 2024-02-04 DIAGNOSIS — Z1211 Encounter for screening for malignant neoplasm of colon: Secondary | ICD-10-CM

## 2024-02-04 NOTE — Progress Notes (Signed)
 Pt's name and DOB verified at the beginning of the pre-visit with 2 identifiers  Permission given to speak with Husband present  Pt denies any difficulty with ambulating,sitting, laying down or rolling side to side  Pt has no issues moving head neck or swallowing  No egg or soy allergy known to patient   No issues known to pt with past sedation but PONV  No FH of Malignant Hyperthermia  Pt is not on home 02   Pt is not on blood thinners   Pt has frequent issues with constipation RN instructed pt to use Miralax  per bottles instructions a week before prep days. Pt states they will  Pt is not on dialysis  Pt denise any abnormal heart rhythms    Patient's chart reviewed by Norleen Schillings CNRA prior to pre-visit and patient appropriate for the [facility].  Pre-visit completed and red dot placed by patient's name on their procedure day (on provider's schedule).    Has prep at home  Visit in person  Pt states weight is 153 lb   Pt given  both [facility] main # and MD on call # prior to instructions.  Informed pt to come in at the time discussed and is shown on PV instructions.  Pt instructed to use Singlecare.com or GoodRx for a price reduction on prep  Instructed pt where to find PV instructions in My Chart. A copy given to pt Instructed pt on all aspects of written instructions including med holds clothing to wear and foods to eat and not eat as well as after procedure legal restrictions and to call MD on call if needed.. Pt states understanding. Instructed pt to review instructions again prior to procedure and call main # given if has any questions or any issues. Pt states they will.

## 2024-02-09 ENCOUNTER — Encounter (HOSPITAL_COMMUNITY)

## 2024-02-09 ENCOUNTER — Encounter: Payer: Self-pay | Admitting: Internal Medicine

## 2024-02-18 ENCOUNTER — Encounter: Payer: Self-pay | Admitting: Internal Medicine

## 2024-02-18 ENCOUNTER — Ambulatory Visit: Admitting: Internal Medicine

## 2024-02-18 VITALS — BP 129/60 | HR 81 | Temp 97.2°F | Resp 16 | Ht 60.0 in | Wt 153.0 lb

## 2024-02-18 DIAGNOSIS — K5730 Diverticulosis of large intestine without perforation or abscess without bleeding: Secondary | ICD-10-CM

## 2024-02-18 DIAGNOSIS — Z1211 Encounter for screening for malignant neoplasm of colon: Secondary | ICD-10-CM

## 2024-02-18 DIAGNOSIS — Z800 Family history of malignant neoplasm of digestive organs: Secondary | ICD-10-CM | POA: Diagnosis not present

## 2024-02-18 DIAGNOSIS — K635 Polyp of colon: Secondary | ICD-10-CM | POA: Diagnosis not present

## 2024-02-18 DIAGNOSIS — D120 Benign neoplasm of cecum: Secondary | ICD-10-CM

## 2024-02-18 MED ORDER — SODIUM CHLORIDE 0.9 % IV SOLN: 500.0000 mL | Freq: Once | INTRAVENOUS | Status: DC

## 2024-02-18 NOTE — Progress Notes (Signed)
 "   GASTROENTEROLOGY PROCEDURE H&P NOTE   Primary Care Physician: Corwin Antu, FNP    Reason for Procedure:  Family history of colon cancer  Plan:    Colonoscopy  Patient is appropriate for endoscopic procedure(s) in the ambulatory ([facility]) setting.  The nature of the procedure, as well as the risks, benefits, and alternatives were carefully and thoroughly reviewed with the patient. Ample time for discussion and questions allowed.  All questions were answered. The patient understood, was satisfied, and agreed with the plan to proceed.    HPI: Nicole Calhoun is a 69 y.o. female who presents for colonoscopy.  Medical history as below.  Tolerated the prep.  No recent chest pain or shortness of breath.  No abdominal pain today.  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   Chronic lower back pain    Closed fracture of proximal end of right humerus 06/21/2014   Coronary artery disease    Mild   Depression    Dysrhythmia    Family history of breast cancer 01/18/2021   Family history of colon cancer in father 07/31/2017   Family history of rheumatoid arthritis 04/23/2021   Fibromyalgia    GERD (gastroesophageal reflux disease)    Hard of hearing    Heart murmur    Hepatitis C    I took the treatment and was cleared (07/05/2014)   History of hiatal hernia    Hyperlipidemia    Myocardial infarction (HCC)    I was told I'd had a light heart attack one time; maybe 2001   Osteoporosis    Pancreatic cyst    PONV (postoperative nausea and vomiting)    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   CATARACT EXTRACTION W/ INTRAOCULAR LENS  IMPLANT,  BILATERAL Bilateral    CESAREAN SECTION  X 3   COLONOSCOPY     FOOT SURGERY Left 04/28/2012   EXCISION OF PLANTAR FIBROMA , PLANTAR ASPECT LEFT ARCH   GANGLION CYST EXCISION Bilateral    2 on the right; 1 on the left   ORIF HUMERUS FRACTURE Right 07/05/2014   Procedure: OPEN REDUCTION INTERNAL FIXATION (ORIF) PROXIMAL HUMERUS FRACTURE;  Surgeon: Eva Herring, MD;  Location: [facility] OR;  Service: Orthopedics;  Laterality: Right;  Open reduction internal fixation right shoulder    ORIF PROXIMAL HUMERUS FRACTURE Right 07/05/2014   TRANSTHORACIC ECHOCARDIOGRAM  06/08/2009   EF >55%, normal LV systolic function    Prior to Admission medications  Medication Sig Start Date End Date Taking? Authorizing Provider  atorvastatin  (LIPITOR) 10 MG tablet Take 1 tablet (10 mg total) by mouth daily. 06/12/23  Yes Dugal, Antu, FNP  buPROPion  (WELLBUTRIN  XL) 150 MG 24 hr tablet Take 1 tablet (150 mg total) by mouth daily. 12/19/23  Yes Dugal, Tabitha, FNP  Calcium -Magnesium-Vitamin D  (CALCIUM  MAGNESIUM PO) Take by mouth.   Yes [provider]  cyclobenzaprine (FLEXERIL) 5 MG tablet TAKE 1 TABLET (5 MG TOTAL) BY MOUTH 2 (TWO) TIMES DAILY. *PA REQUIRED FOR WORKERS COMP - WCB* Patient taking differently: as needed. 06/05/18  Yes [provider]  sertraline  (ZOLOFT ) 50 MG tablet  TAKE ONE TABLET BY MOUTH ONCE DAILY 11/19/23  Yes Dugal, Ginger, FNP  cetirizine (ZYRTEC ALLERGY) 10 MG tablet Take 10 mg by mouth. As needed    [provider]  desonide  (DESOWEN ) 0.05 % cream Apply topically 2 (two) times daily. 06/24/22   Corwin Ginger, FNP  estradiol  (ESTRACE ) 0.1 MG/GM vaginal cream Place 1 Applicatorful vaginally 2 (two) times a week. Patient not taking: Reported on 02/18/2024 09/10/21   Dugal, Tabitha, FNP  gabapentin  (NEURONTIN ) 100 MG capsule Take 1 capsule (100 mg total) by mouth 3 (three) times daily. Patient taking differently: Take 100 mg by mouth as needed. 08/25/23   Dugal, Tabitha,  FNP  meloxicam  (MOBIC ) 7.5 MG tablet TAKE 1 TABLET BY MOUTH ONCE DAILY Patient taking differently: Take 7.5 mg by mouth as needed. 12/27/21   Wendee Lynwood HERO, NP  omeprazole  (PRILOSEC) 20 MG capsule Take 1 capsule (20 mg total) by mouth daily. Patient taking differently: Take 20 mg by mouth as needed. 11/28/23   Dugal, Tabitha, FNP  valACYclovir  (VALTREX ) 500 MG tablet TAKE 1 TABLET BY MOUTH 2 TIMES DAILY FOR 3 DAYS WITH SYMPTOMS Patient taking differently: as needed. TAKE 1 TABLET BY MOUTH 2 TIMES DAILY FOR 3 DAYS WITH SYMPTOMS 02/11/23   Corwin Ginger, FNP    Current Outpatient Medications  Medication Sig Dispense Refill   atorvastatin  (LIPITOR) 10 MG tablet Take 1 tablet (10 mg total) by mouth daily. 90 tablet 3   buPROPion  (WELLBUTRIN  XL) 150 MG 24 hr tablet Take 1 tablet (150 mg total) by mouth daily. 90 tablet 1   Calcium -Magnesium-Vitamin D  (CALCIUM  MAGNESIUM PO) Take by mouth.     cyclobenzaprine (FLEXERIL) 5 MG tablet TAKE 1 TABLET (5 MG TOTAL) BY MOUTH 2 (TWO) TIMES DAILY. *PA REQUIRED FOR WORKERS COMP - WCB* (Patient taking differently: as needed.)     sertraline  (ZOLOFT ) 50 MG tablet TAKE ONE TABLET BY MOUTH ONCE DAILY 90 tablet 3   cetirizine (ZYRTEC ALLERGY) 10 MG tablet Take 10 mg by mouth. As needed     desonide  (DESOWEN ) 0.05 % cream Apply topically 2 (two) times daily. 30 g 0   estradiol  (ESTRACE ) 0.1 MG/GM vaginal cream Place 1 Applicatorful vaginally 2 (two) times a week. (Patient not taking: Reported on 02/18/2024) 42.5 g 12   gabapentin  (NEURONTIN ) 100 MG capsule Take 1 capsule (100 mg total) by mouth 3 (three) times daily. (Patient taking differently: Take 100 mg by mouth as needed.) 90 capsule 3   meloxicam  (MOBIC ) 7.5 MG tablet TAKE 1 TABLET BY MOUTH ONCE DAILY (Patient taking differently: Take 7.5 mg by mouth as needed.) 30 tablet 0   omeprazole  (PRILOSEC) 20 MG capsule Take 1 capsule (20 mg total) by mouth daily. (Patient taking differently: Take 20 mg by mouth as  needed.) 30 capsule 0   valACYclovir  (VALTREX ) 500 MG tablet TAKE 1 TABLET BY MOUTH 2 TIMES DAILY FOR 3 DAYS WITH SYMPTOMS (Patient taking differently: as needed. TAKE 1 TABLET BY MOUTH 2 TIMES DAILY FOR 3 DAYS WITH SYMPTOMS) 18 tablet 2   Current Facility-Administered Medications  Medication Dose Route Frequency Provider Last Rate Last Admin   0.9 %  sodium chloride  infusion  500 mL Intravenous Once Malyna Budney, Gordy HERO, MD        Allergies as of 02/18/2024 - Review Complete 02/18/2024  Allergen Reaction Noted   Codeine Nausea Only 08/08/2011   Fosamax  [alendronate ] Other (See Comments) 06/24/2022    Family History  Problem Relation Age of Onset   Arthritis  Mother    Heart disease Mother 34   Stroke Mother 76   Colon cancer Father    Stroke Father 79   Breast cancer Maternal Grandmother 20   Brain cancer Cousin    Esophageal cancer Neg Hx    Rectal cancer Neg Hx    Stomach cancer Neg Hx    Colon polyps Neg Hx     Social History   Socioeconomic History   Marital status: Married    Spouse name: Not on file   Number of children: 2   Years of education: Not on file   Highest education level: Not on file  Occupational History   Not on file  Tobacco Use   Smoking status: Former    Current packs/day: 0.50    Average packs/day: 0.5 packs/day for 8.0 years (4.0 ttl pk-yrs)    Types: Cigarettes   Smokeless tobacco: Never   Tobacco comments:    quit smoking cigarettes in the 1980's  Vaping Use   Vaping status: Never Used  Substance and Sexual Activity   Alcohol use: Not Currently    Comment: used to drink when I was younger   Drug use: No   Sexual activity: Yes  Other Topics Concern   Not on file  Social History Narrative   ** Merged History Encounter ** Two boys    Married       Are you right handed or left handed? Left handed    Are you currently employed ? No    What is your current occupation? Retired    Do you live at home alone? No    Who lives with you?  Husband    What type of home do you live in: 1 story or 2 story? One story home       Social Drivers of Health   Tobacco Use: Medium Risk (02/18/2024)   Patient History    Smoking Tobacco Use: Former    Smokeless Tobacco Use: Never    Passive Exposure: Not on file  Financial Resource Strain: Low Risk (01/07/2024)   Overall Financial Resource Strain (CARDIA)    Difficulty of Paying Living Expenses: Not very hard  Food Insecurity: No Food Insecurity (01/07/2024)   Epic    Worried About Programme Researcher, Broadcasting/film/video in the Last Year: Never true    Ran Out of Food in the Last Year: Never true  Transportation Needs: No Transportation Needs (01/07/2024)   Epic    Lack of Transportation (Medical): No    Lack of Transportation (Non-Medical): No  Physical Activity: Insufficiently Active (01/07/2024)   Exercise Vital Sign    Days of Exercise per Week: 3 days    Minutes of Exercise per Session: 30 min  Stress: No Stress Concern Present (01/07/2024)   [employer] of Occupational Health - Occupational Stress Questionnaire    Feeling of Stress: Not at all  Social Connections: Moderately Isolated (01/07/2024)   Social Connection and Isolation Panel    Frequency of Communication with Friends and Family: Three times a week    Frequency of Social Gatherings with Friends and Family: Three times a week    Attends Religious Services: Never    Active Member of Clubs or Organizations: No    Attends Banker Meetings: Never    Marital Status: Married  Catering Manager Violence: Not At Risk (01/07/2024)   Epic    Fear of Current or Ex-Partner: No    Emotionally Abused: No    Physically Abused:  No    Sexually Abused: No  Depression (PHQ2-9): Low Risk (01/07/2024)   Depression (PHQ2-9)    PHQ-2 Score: 0  Alcohol Screen: Low Risk (01/07/2024)   Alcohol Screen    Last Alcohol Screening Score (AUDIT): 0  Housing: Unknown (01/07/2024)   Epic    Unable to Pay for Housing in the Last Year: No     Number of Times Moved in the Last Year: Not on file    Homeless in the Last Year: No  Utilities: Not At Risk (01/07/2024)   Epic    Threatened with loss of utilities: No  Health Literacy: Adequate Health Literacy (01/07/2024)   B1300 Health Literacy    Frequency of need for help with medical instructions: Never    Physical Exam: Vital signs in last 24 hours: @BP  135/63   Pulse 76   Temp (!) 97.2 F (36.2 C) (Temporal)   Resp 19   Ht 5' (1.524 m)   Wt 153 lb (69.4 kg)   SpO2 100%   BMI 29.88 kg/m  GEN: NAD EYE: Sclerae anicteric ENT: MMM CV: Non-tachycardic Pulm: CTA b/l GI: Soft, NT/ND NEURO:  Alert & Oriented x 3   Gordy Starch, MD [facility]  02/18/2024 11:21 AM  "

## 2024-02-18 NOTE — Progress Notes (Signed)
 Pt's states no medical or surgical changes since previsit or office visit.

## 2024-02-18 NOTE — Progress Notes (Signed)
 To pacu, VSS. Report to [email or web address]

## 2024-02-18 NOTE — Op Note (Signed)
 [facility] Patient Name: Nicole Calhoun Procedure Date: 02/18/2024 10:39 AM MRN: [id number] Endoscopist: Gordy CHRISTELLA Starch , MD, [id number] Age: 69 Referring MD:  Date of Birth: Dec 15, 1955 Gender: Female Account #: [id number] Procedure:                Colonoscopy Indications:              Screening in patient at increased risk: Family                            history of 1st-degree relative with colorectal                            cancer (father), Last colonoscopy: 2019 Medicines:                Monitored Anesthesia Care Procedure:                Pre-Anesthesia Assessment:                           - Prior to the procedure, a History and Physical                            was performed, and patient medications and                            allergies were reviewed. The patient's tolerance of                            previous anesthesia was also reviewed. The risks                            and benefits of the procedure and the sedation                            options and risks were discussed with the patient.                            All questions were answered, and informed consent                            was obtained. Prior Anticoagulants: The patient has                            taken no anticoagulant or antiplatelet agents. ASA                            Grade Assessment: II - A patient with mild systemic                            disease. After reviewing the risks and benefits,                            the patient was deemed in satisfactory condition to  undergo the procedure.                           After obtaining informed consent, the colonoscope                            was passed under direct vision. Throughout the                            procedure, the patient's blood pressure, pulse, and                            oxygen saturations were monitored continuously. The                            [id number]-HQ190L Colonoscope  [id number] was introduced                            through the anus and advanced to the cecum,                            identified by appendiceal orifice and ileocecal                            valve. The colonoscopy was performed without                            difficulty. The patient tolerated the procedure                            well. The quality of the bowel preparation was                            good. The ileocecal valve, appendiceal orifice, and                            rectum were photographed. Scope In: 11:32:00 AM Scope Out: 11:48:47 AM Scope Withdrawal Time: 0 hours 10 minutes 11 seconds  Total Procedure Duration: 0 hours 16 minutes 47 seconds  Findings:                 The digital rectal exam was normal.                           A 3 mm polyp was found in the cecum. The polyp was                            sessile. The polyp was removed with a cold snare.                            Resection and retrieval were complete.                           Multiple medium-mouthed and small-mouthed  diverticula were found in the sigmoid colon.                           The exam was otherwise without abnormality on                            direct and retroflexion views. Complications:            No immediate complications. Estimated Blood Loss:     Estimated blood loss: none. Impression:               - One 3 mm polyp in the cecum, removed with a cold                            snare. Resected and retrieved.                           - Moderate diverticulosis in the sigmoid colon.                           - The examination was otherwise normal on direct                            and retroflexion views. Recommendation:           - Patient has a contact number available for                            emergencies. The signs and symptoms of potential                            delayed complications were discussed with the                             patient. Return to normal activities tomorrow.                            Written discharge instructions were provided to the                            patient.                           - Resume previous diet.                           - Continue present medications.                           - Await pathology results.                           - Repeat colonoscopy in 5 years. Gordy CHRISTELLA Starch, MD 02/18/2024 11:51:19 AM This report has been signed electronically.

## 2024-02-18 NOTE — Patient Instructions (Signed)
 Handouts given: Polyps, Diverticulosis Resume previous diet. Continue present medications.  Await pathology results. Repeat colonoscopy in 5 years.  YOU HAD AN ENDOSCOPIC PROCEDURE TODAY AT THE [facility]:   Refer to the procedure report that was given to you for any specific questions about what was found during the examination.  If the procedure report does not answer your questions, please call your gastroenterologist to clarify.  If you requested that your care partner not be given the details of your procedure findings, then the procedure report has been included in a sealed envelope for you to review at your convenience later.  YOU SHOULD EXPECT: Some feelings of bloating in the abdomen. Passage of more gas than usual.  Walking can help get rid of the air that was put into your GI tract during the procedure and reduce the bloating. If you had a lower endoscopy (such as a colonoscopy or flexible sigmoidoscopy) you may notice spotting of blood in your stool or on the toilet paper. If you underwent a bowel prep for your procedure, you may not have a normal bowel movement for a few days.  Please Note:  You might notice some irritation and congestion in your nose or some drainage.  This is from the oxygen used during your procedure.  There is no need for concern and it should clear up in a day or so.  SYMPTOMS TO REPORT IMMEDIATELY:  Following lower endoscopy (colonoscopy or flexible sigmoidoscopy):  Excessive amounts of blood in the stool  Significant tenderness or worsening of abdominal pains  Swelling of the abdomen that is new, acute  Fever of 100F or higher  For urgent or emergent issues, a gastroenterologist can be reached at any hour by calling [phone]. Do not use MyChart messaging for urgent concerns.    DIET:  We do recommend a small meal at first, but then you may proceed to your regular diet.  Drink plenty of fluids but you should avoid alcoholic beverages  for 24 hours.  ACTIVITY:  You should plan to take it easy for the rest of today and you should NOT DRIVE or use heavy machinery until tomorrow (because of the sedation medicines used during the test).    FOLLOW UP: Our staff will call the number listed on your records the next business day following your procedure.  We will call around 7:15- 8:00 am to check on you and address any questions or concerns that you may have regarding the information given to you following your procedure. If we do not reach you, we will leave a message.     If any biopsies were taken you will be contacted by phone or by letter within the next 1-3 weeks.  Please call us  at [phone] if you have not heard about the biopsies in 3 weeks.    SIGNATURES/CONFIDENTIALITY: You and/or your care partner have signed paperwork which will be entered into your electronic medical record.  These signatures attest to the fact that that the information above on your After Visit Summary has been reviewed and is understood.  Full responsibility of the confidentiality of this discharge information lies with you and/or your care-partner.

## 2024-02-18 NOTE — Progress Notes (Signed)
 Called to room to assist during endoscopic procedure.  Patient ID and intended procedure confirmed with present staff. Received instructions for my participation in the procedure from the performing physician.

## 2024-02-19 ENCOUNTER — Telehealth: Payer: Self-pay | Admitting: *Deleted

## 2024-02-19 NOTE — Telephone Encounter (Signed)
" °  Follow up Call-     02/18/2024   10:50 AM  Call back number  Post procedure Call Back phone  # [phone]  Permission to leave phone message Yes     Post procedure follow up phone call. No answer at number given.  Left message on voicemail.    "

## 2024-02-20 LAB — SURGICAL PATHOLOGY

## 2024-06-24 ENCOUNTER — Ambulatory Visit

## 2024-08-26 ENCOUNTER — Encounter: Admitting: Family
